# Patient Record
Sex: Female | Born: 1960 | Race: White | Hispanic: No | Marital: Married | Smoking: Never smoker
Health system: Southern US, Community
[De-identification: ages and names within clinical notes are randomized; demographics above are authoritative.]

## PROBLEM LIST (undated history)

## (undated) DIAGNOSIS — R9431 Abnormal electrocardiogram [ECG] [EKG]: Secondary | ICD-10-CM

## (undated) DIAGNOSIS — IMO0001 Reserved for inherently not codable concepts without codable children: Secondary | ICD-10-CM

## (undated) DIAGNOSIS — G43909 Migraine, unspecified, not intractable, without status migrainosus: Secondary | ICD-10-CM

## (undated) DIAGNOSIS — D649 Anemia, unspecified: Secondary | ICD-10-CM

## (undated) DIAGNOSIS — I10 Essential (primary) hypertension: Secondary | ICD-10-CM

## (undated) DIAGNOSIS — M4802 Spinal stenosis, cervical region: Secondary | ICD-10-CM

## (undated) DIAGNOSIS — K648 Other hemorrhoids: Secondary | ICD-10-CM

## (undated) HISTORY — DX: Reserved for inherently not codable concepts without codable children: IMO0001

## (undated) HISTORY — PX: ORIF CLAVICULAR FRACTURE: SHX5055

## (undated) HISTORY — PX: FOOT SURGERY: SHX648

## (undated) HISTORY — DX: Anemia, unspecified: D64.9

## (undated) HISTORY — DX: Other hemorrhoids: K64.8

## (undated) HISTORY — PX: TONSILLECTOMY AND ADENOIDECTOMY: SUR1326

## (undated) HISTORY — PX: OTHER SURGICAL HISTORY: SHX169

## (undated) HISTORY — DX: Migraine, unspecified, not intractable, without status migrainosus: G43.909

## (undated) HISTORY — DX: Abnormal electrocardiogram (ECG) (EKG): R94.31

## (undated) HISTORY — DX: Spinal stenosis, cervical region: M48.02

## (undated) HISTORY — DX: Essential (primary) hypertension: I10

---

## 1987-02-14 HISTORY — PX: OTHER SURGICAL HISTORY: SHX169

## 1997-08-21 ENCOUNTER — Emergency Department (HOSPITAL_COMMUNITY): Admission: EM | Admit: 1997-08-21 | Discharge: 1997-08-21 | Payer: Self-pay | Admitting: Emergency Medicine

## 1999-01-05 ENCOUNTER — Other Ambulatory Visit: Admission: RE | Admit: 1999-01-05 | Discharge: 1999-01-05 | Payer: Self-pay | Admitting: Obstetrics and Gynecology

## 1999-05-11 ENCOUNTER — Encounter: Admission: RE | Admit: 1999-05-11 | Discharge: 1999-05-11 | Payer: Self-pay | Admitting: Obstetrics and Gynecology

## 1999-05-11 ENCOUNTER — Encounter: Payer: Self-pay | Admitting: Obstetrics and Gynecology

## 2000-07-05 ENCOUNTER — Encounter: Payer: Self-pay | Admitting: Obstetrics and Gynecology

## 2000-07-05 ENCOUNTER — Encounter: Admission: RE | Admit: 2000-07-05 | Discharge: 2000-07-05 | Payer: Self-pay | Admitting: Obstetrics and Gynecology

## 2000-08-01 ENCOUNTER — Other Ambulatory Visit: Admission: RE | Admit: 2000-08-01 | Discharge: 2000-08-01 | Payer: Self-pay | Admitting: Obstetrics and Gynecology

## 2000-08-02 ENCOUNTER — Encounter (INDEPENDENT_AMBULATORY_CARE_PROVIDER_SITE_OTHER): Payer: Self-pay

## 2000-08-02 ENCOUNTER — Other Ambulatory Visit: Admission: RE | Admit: 2000-08-02 | Discharge: 2000-08-02 | Payer: Self-pay | Admitting: Obstetrics and Gynecology

## 2001-02-13 HISTORY — PX: LUMBAR LAMINECTOMY: SHX95

## 2001-07-25 ENCOUNTER — Encounter: Admission: RE | Admit: 2001-07-25 | Discharge: 2001-07-25 | Payer: Self-pay | Admitting: Obstetrics and Gynecology

## 2001-07-25 ENCOUNTER — Encounter: Payer: Self-pay | Admitting: Obstetrics and Gynecology

## 2001-09-26 ENCOUNTER — Encounter: Admission: RE | Admit: 2001-09-26 | Discharge: 2001-09-26 | Payer: Self-pay | Admitting: Internal Medicine

## 2001-09-26 ENCOUNTER — Encounter: Payer: Self-pay | Admitting: Internal Medicine

## 2001-11-05 ENCOUNTER — Encounter: Payer: Self-pay | Admitting: Internal Medicine

## 2001-11-05 ENCOUNTER — Encounter: Admission: RE | Admit: 2001-11-05 | Discharge: 2001-11-05 | Payer: Self-pay | Admitting: Internal Medicine

## 2001-11-12 ENCOUNTER — Ambulatory Visit (HOSPITAL_COMMUNITY): Admission: RE | Admit: 2001-11-12 | Discharge: 2001-11-12 | Payer: Self-pay | Admitting: Internal Medicine

## 2001-11-12 ENCOUNTER — Encounter: Payer: Self-pay | Admitting: Internal Medicine

## 2002-02-12 ENCOUNTER — Other Ambulatory Visit: Admission: RE | Admit: 2002-02-12 | Discharge: 2002-02-12 | Payer: Self-pay | Admitting: Obstetrics and Gynecology

## 2002-02-12 ENCOUNTER — Encounter: Admission: RE | Admit: 2002-02-12 | Discharge: 2002-02-12 | Payer: Self-pay | Admitting: Internal Medicine

## 2002-02-12 ENCOUNTER — Encounter: Payer: Self-pay | Admitting: Internal Medicine

## 2002-07-28 ENCOUNTER — Encounter: Payer: Self-pay | Admitting: Obstetrics and Gynecology

## 2002-07-28 ENCOUNTER — Encounter: Admission: RE | Admit: 2002-07-28 | Discharge: 2002-07-28 | Payer: Self-pay | Admitting: Obstetrics and Gynecology

## 2003-08-20 ENCOUNTER — Encounter: Admission: RE | Admit: 2003-08-20 | Discharge: 2003-08-20 | Payer: Self-pay | Admitting: Obstetrics and Gynecology

## 2003-08-25 ENCOUNTER — Encounter: Admission: RE | Admit: 2003-08-25 | Discharge: 2003-08-25 | Payer: Self-pay | Admitting: Obstetrics and Gynecology

## 2004-03-25 ENCOUNTER — Ambulatory Visit: Payer: Self-pay | Admitting: Family Medicine

## 2004-04-28 ENCOUNTER — Other Ambulatory Visit: Admission: RE | Admit: 2004-04-28 | Discharge: 2004-04-28 | Payer: Self-pay | Admitting: Obstetrics and Gynecology

## 2004-05-02 ENCOUNTER — Encounter: Admission: RE | Admit: 2004-05-02 | Discharge: 2004-05-02 | Payer: Self-pay | Admitting: Obstetrics and Gynecology

## 2004-09-01 ENCOUNTER — Encounter: Admission: RE | Admit: 2004-09-01 | Discharge: 2004-09-01 | Payer: Self-pay | Admitting: Obstetrics and Gynecology

## 2004-09-14 ENCOUNTER — Encounter: Admission: RE | Admit: 2004-09-14 | Discharge: 2004-09-14 | Payer: Self-pay | Admitting: Obstetrics and Gynecology

## 2004-09-16 ENCOUNTER — Encounter: Admission: RE | Admit: 2004-09-16 | Discharge: 2004-09-16 | Payer: Self-pay | Admitting: Obstetrics and Gynecology

## 2005-02-13 DIAGNOSIS — R9431 Abnormal electrocardiogram [ECG] [EKG]: Secondary | ICD-10-CM

## 2005-02-13 DIAGNOSIS — IMO0001 Reserved for inherently not codable concepts without codable children: Secondary | ICD-10-CM

## 2005-02-13 HISTORY — DX: Reserved for inherently not codable concepts without codable children: IMO0001

## 2005-02-13 HISTORY — DX: Abnormal electrocardiogram (ECG) (EKG): R94.31

## 2005-03-24 ENCOUNTER — Ambulatory Visit: Payer: Self-pay | Admitting: Internal Medicine

## 2005-03-28 ENCOUNTER — Ambulatory Visit: Payer: Self-pay | Admitting: Internal Medicine

## 2005-04-04 ENCOUNTER — Ambulatory Visit: Payer: Self-pay | Admitting: Internal Medicine

## 2005-04-20 ENCOUNTER — Ambulatory Visit: Payer: Self-pay

## 2005-04-26 ENCOUNTER — Ambulatory Visit: Payer: Self-pay | Admitting: Internal Medicine

## 2005-05-25 ENCOUNTER — Other Ambulatory Visit: Admission: RE | Admit: 2005-05-25 | Discharge: 2005-05-25 | Payer: Self-pay | Admitting: Obstetrics and Gynecology

## 2005-10-23 ENCOUNTER — Encounter: Admission: RE | Admit: 2005-10-23 | Discharge: 2005-10-23 | Payer: Self-pay | Admitting: Obstetrics and Gynecology

## 2006-06-14 DIAGNOSIS — M4800 Spinal stenosis, site unspecified: Secondary | ICD-10-CM

## 2006-06-14 DIAGNOSIS — Z9089 Acquired absence of other organs: Secondary | ICD-10-CM | POA: Insufficient documentation

## 2006-06-15 ENCOUNTER — Ambulatory Visit: Payer: Self-pay | Admitting: Internal Medicine

## 2006-06-15 LAB — CONVERTED CEMR LAB
Albumin: 3.8 g/dL (ref 3.5–5.2)
Basophils Absolute: 0.1 10*3/uL (ref 0.0–0.1)
Bilirubin, Direct: 0.1 mg/dL (ref 0.0–0.3)
Eosinophils Absolute: 0.1 10*3/uL (ref 0.0–0.6)
Eosinophils Relative: 1.5 % (ref 0.0–5.0)
GFR calc Af Amer: 87 mL/min
GFR calc non Af Amer: 72 mL/min
Glucose, Bld: 82 mg/dL (ref 70–99)
HDL: 55.4 mg/dL (ref >39.0)
Lymphocytes Relative: 25.5 % (ref 12.0–46.0)
MCHC: 34.2 g/dL (ref 30.0–36.0)
MCV: 87.2 fL (ref 78.0–100.0)
Monocytes Absolute: 0.4 10*3/uL (ref 0.2–0.7)
Neutro Abs: 6 10*3/uL (ref 1.4–7.7)
Neutrophils Relative %: 68.1 % (ref 43.0–77.0)
Potassium: 4.1 meq/L (ref 3.5–5.1)
RBC: 4.38 M/uL (ref 3.87–5.11)
Sodium: 144 meq/L (ref 135–145)
TSH: 1.68 microintl units/mL (ref 0.35–5.50)
Total CHOL/HDL Ratio: 2.9
Total Protein: 6.7 g/dL (ref 6.0–8.3)
Triglycerides: 88 mg/dL (ref 0–149)
VLDL: 18 mg/dL (ref 0–40)
WBC: 8.8 10*3/uL (ref 4.5–10.5)

## 2006-10-01 ENCOUNTER — Ambulatory Visit: Payer: Self-pay | Admitting: Internal Medicine

## 2006-11-15 ENCOUNTER — Encounter: Admission: RE | Admit: 2006-11-15 | Discharge: 2006-11-15 | Payer: Self-pay | Admitting: Obstetrics and Gynecology

## 2006-11-21 ENCOUNTER — Encounter: Admission: RE | Admit: 2006-11-21 | Discharge: 2006-11-21 | Payer: Self-pay | Admitting: Obstetrics and Gynecology

## 2007-09-03 ENCOUNTER — Ambulatory Visit: Payer: Self-pay | Admitting: Internal Medicine

## 2007-09-03 DIAGNOSIS — D649 Anemia, unspecified: Secondary | ICD-10-CM | POA: Insufficient documentation

## 2007-09-03 DIAGNOSIS — R9431 Abnormal electrocardiogram [ECG] [EKG]: Secondary | ICD-10-CM | POA: Insufficient documentation

## 2007-09-03 DIAGNOSIS — R635 Abnormal weight gain: Secondary | ICD-10-CM | POA: Insufficient documentation

## 2007-11-25 ENCOUNTER — Ambulatory Visit: Payer: Self-pay | Admitting: Family Medicine

## 2007-12-10 ENCOUNTER — Encounter: Admission: RE | Admit: 2007-12-10 | Discharge: 2007-12-10 | Payer: Self-pay | Admitting: Obstetrics and Gynecology

## 2008-03-19 ENCOUNTER — Ambulatory Visit: Payer: Self-pay | Admitting: Internal Medicine

## 2008-03-19 DIAGNOSIS — M79609 Pain in unspecified limb: Secondary | ICD-10-CM | POA: Insufficient documentation

## 2008-04-02 ENCOUNTER — Encounter: Payer: Self-pay | Admitting: Internal Medicine

## 2008-12-21 ENCOUNTER — Ambulatory Visit: Payer: Self-pay | Admitting: Internal Medicine

## 2008-12-21 DIAGNOSIS — G43909 Migraine, unspecified, not intractable, without status migrainosus: Secondary | ICD-10-CM | POA: Insufficient documentation

## 2008-12-21 DIAGNOSIS — R030 Elevated blood-pressure reading, without diagnosis of hypertension: Secondary | ICD-10-CM

## 2008-12-21 DIAGNOSIS — Z780 Asymptomatic menopausal state: Secondary | ICD-10-CM

## 2008-12-23 ENCOUNTER — Encounter: Admission: RE | Admit: 2008-12-23 | Discharge: 2008-12-23 | Payer: Self-pay | Admitting: Obstetrics and Gynecology

## 2008-12-28 ENCOUNTER — Telehealth: Payer: Self-pay | Admitting: Internal Medicine

## 2009-01-11 ENCOUNTER — Encounter: Admission: RE | Admit: 2009-01-11 | Discharge: 2009-01-11 | Payer: Self-pay | Admitting: Obstetrics and Gynecology

## 2009-01-14 ENCOUNTER — Encounter: Payer: Self-pay | Admitting: Internal Medicine

## 2009-03-09 ENCOUNTER — Encounter: Payer: Self-pay | Admitting: Internal Medicine

## 2009-05-04 ENCOUNTER — Ambulatory Visit: Payer: Self-pay | Admitting: Internal Medicine

## 2009-05-18 ENCOUNTER — Telehealth (INDEPENDENT_AMBULATORY_CARE_PROVIDER_SITE_OTHER): Payer: Self-pay | Admitting: *Deleted

## 2009-05-20 ENCOUNTER — Ambulatory Visit: Payer: Self-pay | Admitting: Internal Medicine

## 2009-05-24 LAB — CONVERTED CEMR LAB
HCT: 35.3 % — ABNORMAL LOW (ref 36.0–46.0)
Hemoglobin: 11.8 g/dL — ABNORMAL LOW (ref 12.0–15.0)
MCHC: 33.4 g/dL (ref 30.0–36.0)
MCV: 83.9 fL (ref 78.0–100.0)
Platelets: 249 10*3/uL (ref 150.0–400.0)
RDW: 15.8 % — ABNORMAL HIGH (ref 11.5–14.6)

## 2009-05-25 ENCOUNTER — Ambulatory Visit: Payer: Self-pay | Admitting: Internal Medicine

## 2009-05-25 DIAGNOSIS — G479 Sleep disorder, unspecified: Secondary | ICD-10-CM

## 2009-05-25 DIAGNOSIS — R5383 Other fatigue: Secondary | ICD-10-CM

## 2009-05-25 DIAGNOSIS — R5381 Other malaise: Secondary | ICD-10-CM

## 2009-07-01 ENCOUNTER — Ambulatory Visit: Payer: Self-pay | Admitting: Internal Medicine

## 2009-07-05 LAB — CONVERTED CEMR LAB
ALT: 20 units/L (ref 0–35)
BUN: 10 mg/dL (ref 6–23)
Basophils Absolute: 0 10*3/uL (ref 0.0–0.1)
CO2: 30 meq/L (ref 19–32)
Chloride: 102 meq/L (ref 96–112)
Creatinine, Ser: 0.9 mg/dL (ref 0.4–1.2)
Eosinophils Relative: 2.2 % (ref 0.0–5.0)
Folate: 11.8 ng/mL
Free T4: 0.8 ng/dL (ref 0.6–1.6)
HCT: 39.7 % (ref 36.0–46.0)
Hemoglobin: 13.4 g/dL (ref 12.0–15.0)
Iron: 44 ug/dL (ref 42–145)
Lymphocytes Relative: 36.6 % (ref 12.0–46.0)
Lymphs Abs: 2.3 10*3/uL (ref 0.7–4.0)
Monocytes Relative: 5.5 % (ref 3.0–12.0)
Neutro Abs: 3.4 10*3/uL (ref 1.4–7.7)
Platelets: 258 10*3/uL (ref 150.0–400.0)
RDW: 15.9 % — ABNORMAL HIGH (ref 11.5–14.6)
TSH: 2.71 microintl units/mL (ref 0.35–5.50)
Total Bilirubin: 0.4 mg/dL (ref 0.3–1.2)
Total Protein: 6.9 g/dL (ref 6.0–8.3)
Vitamin B-12: 322 pg/mL (ref 211–911)
WBC: 6.2 10*3/uL (ref 4.5–10.5)

## 2009-08-05 ENCOUNTER — Telehealth (INDEPENDENT_AMBULATORY_CARE_PROVIDER_SITE_OTHER): Payer: Self-pay | Admitting: *Deleted

## 2009-09-14 ENCOUNTER — Telehealth (INDEPENDENT_AMBULATORY_CARE_PROVIDER_SITE_OTHER): Payer: Self-pay | Admitting: *Deleted

## 2009-09-15 ENCOUNTER — Ambulatory Visit: Payer: Self-pay | Admitting: Internal Medicine

## 2009-09-16 ENCOUNTER — Ambulatory Visit: Payer: Self-pay | Admitting: Cardiovascular Disease

## 2009-09-20 ENCOUNTER — Telehealth (INDEPENDENT_AMBULATORY_CARE_PROVIDER_SITE_OTHER): Payer: Self-pay | Admitting: *Deleted

## 2010-01-10 ENCOUNTER — Telehealth: Payer: Self-pay | Admitting: Internal Medicine

## 2010-01-26 ENCOUNTER — Encounter
Admission: RE | Admit: 2010-01-26 | Discharge: 2010-01-26 | Payer: Self-pay | Source: Home / Self Care | Attending: Obstetrics and Gynecology | Admitting: Obstetrics and Gynecology

## 2010-02-03 ENCOUNTER — Ambulatory Visit: Payer: Self-pay | Admitting: Internal Medicine

## 2010-02-03 DIAGNOSIS — R61 Generalized hyperhidrosis: Secondary | ICD-10-CM

## 2010-02-03 DIAGNOSIS — N951 Menopausal and female climacteric states: Secondary | ICD-10-CM

## 2010-02-13 HISTORY — PX: COLONOSCOPY: SHX174

## 2010-02-15 ENCOUNTER — Telehealth: Payer: Self-pay | Admitting: Internal Medicine

## 2010-03-06 ENCOUNTER — Encounter: Payer: Self-pay | Admitting: Obstetrics and Gynecology

## 2010-03-09 ENCOUNTER — Telehealth: Payer: Self-pay | Admitting: Internal Medicine

## 2010-03-13 LAB — CONVERTED CEMR LAB
ALT: 12 units/L (ref 0–35)
ALT: 19 units/L (ref 0–35)
AST: 16 units/L (ref 0–37)
Albumin: 3.7 g/dL (ref 3.5–5.2)
Alkaline Phosphatase: 50 units/L (ref 39–117)
BUN: 9 mg/dL (ref 6–23)
Basophils Relative: 0.9 % (ref 0.0–3.0)
Bilirubin, Direct: 0 mg/dL (ref 0.0–0.3)
CO2: 30 meq/L (ref 19–32)
Calcium: 9 mg/dL (ref 8.4–10.5)
Calcium: 9.1 mg/dL (ref 8.4–10.5)
Creatinine, Ser: 1 mg/dL (ref 0.4–1.2)
Eosinophils Absolute: 0.2 10*3/uL (ref 0.0–0.7)
Eosinophils Absolute: 0.2 10*3/uL (ref 0.0–0.7)
FSH: 33.4 milliintl units/mL
GFR calc non Af Amer: 80.89 mL/min (ref >60)
HDL: 63 mg/dL (ref >39.00)
Hemoglobin: 13.3 g/dL (ref 12.0–15.0)
Iron: 37 ug/dL — ABNORMAL LOW (ref 42–145)
LDL Cholesterol: 96 mg/dL (ref 0–99)
Lymphocytes Relative: 34.4 % (ref 12.0–46.0)
Lymphocytes Relative: 36.6 % (ref 12.0–46.0)
MCHC: 34.3 g/dL (ref 30.0–36.0)
Monocytes Absolute: 0.4 10*3/uL (ref 0.1–1.0)
Monocytes Relative: 5.9 % (ref 3.0–12.0)
Monocytes Relative: 6.7 % (ref 3.0–12.0)
Neutro Abs: 3.6 10*3/uL (ref 1.4–7.7)
Neutrophils Relative %: 54.3 % (ref 43.0–77.0)
Potassium: 3.7 meq/L (ref 3.5–5.1)
RBC: 4.39 M/uL (ref 3.87–5.11)
RBC: 4.61 M/uL (ref 3.87–5.11)
RDW: 13.8 % (ref 11.5–14.6)
Sodium: 141 meq/L (ref 135–145)
Total Bilirubin: 0.5 mg/dL (ref 0.3–1.2)
Total Bilirubin: 0.6 mg/dL (ref 0.3–1.2)
Total CHOL/HDL Ratio: 3
Total CHOL/HDL Ratio: 3.6
Transferrin: 277 mg/dL (ref >=212.0)
Triglycerides: 93 mg/dL (ref 0–149)
VLDL: 24 mg/dL (ref 0.0–40.0)
WBC: 7.2 10*3/uL (ref 4.5–10.5)

## 2010-03-17 NOTE — Consult Note (Signed)
Summary: [facility]  [facility]   Imported By: Lanelle Bal 03/29/2009 13:42:06  _____________________________________________________________________  External Attachment:    Type:   Image     Comment:   External Document

## 2010-03-17 NOTE — Progress Notes (Signed)
Summary: Meds and black cohosh  Phone Note Call from Patient Call back at Work Phone [phone]   Summary of Call: Patient called today and would like to know if she can take black cohosh along with her other meds (clonidine). Please advise. Initial call taken by: Lucious Groves CMA,  February 15, 2010 11:30 AM  Follow-up for Phone Call        I would take the Cloniodine alone for 2 weeks to help assess response, then  add Black Cohosh if needed Follow-up by: Marga Melnick MD,  February 15, 2010 11:54 AM  Additional Follow-up for Phone Call Additional follow up Details #1::        Patient needed. Additional Follow-up by: Lucious Groves CMA,  February 15, 2010 3:47 PM

## 2010-03-17 NOTE — Progress Notes (Signed)
Summary: REFILL REQUEST  Phone Note Refill Request Call back at [phone] Message from:  Pharmacy on August 05, 2009 8:14 AM  Refills Requested: Medication #1:  CYMBALTA 60 MG CPEP 1 once daily.   Dosage confirmed as above?Dosage Confirmed   Supply Requested: 3 months   Last Refilled: [facility] [address] DAY SUPPLY REQUESTED  Next Appointment Scheduled: NONE Initial call taken by: Lavell Islam,  August 05, 2009 8:15 AM    Prescriptions: CYMBALTA 60 MG CPEP (DULOXETINE HCL) 1 once daily  #90 x 1   Entered by:   Shonna Chock   Authorized by:   Marga Melnick MD   Signed by:   Shonna Chock on 08/05/2009   Method used:   Electronically to        [facility]  [phone][facility] (retail)       [address] [place], [place]  [address]       Ph: [phone] or [phone]       Fax: [phone]   RxID:   [id number]

## 2010-03-17 NOTE — Assessment & Plan Note (Signed)
Summary: DISCUSS BP MED AND DIARY OF READINGS/KB   Vital Signs:  Patient profile:   50 year old female Weight:      172.8 pounds BMI:     27.99 Temp:     97.6 degrees F oral Pulse rate:   64 / minute Resp:     14 per minute BP sitting:   126 / 82  (left arm) Cuff size:   large  Vitals Entered By: Shonna Chock CMA (February 03, 2010 8:09 AM) CC: 1.) B/P concerns   2.) Sweating-? related to Cymbalta, patient would like to discuss d/c'ing Cymbalta   CC:  1.) B/P concerns   2.) Sweating-? related to Cymbalta and patient would like to discuss d/c'ing Cymbalta.  History of Present Illness:      This is a 50 year old woman who presents for Hypertension follow-up; she questions BP control..  The patient reports  minor headaches relieved with Tylenol or NSAIDS , but denies lightheadedness, urinary frequency, edema, and fatigue.  The patient denies the following associated symptoms: chest pain, chest pressure, exercise intolerance, dyspnea, palpitations, and syncope.  Compliance with medications (by patient report) has been near 100%.  The patient reports exercising 2X per week.  Adjunctive measures currently used by the patient include salt restriction. BP range :118/63- 149/84.  Sweating is excessive, especially after showering in am.Hot flashes  hourly during the day.No menses X 4 months.FSH was normal last Summer.  Current Medications (verified): 1)  Cymbalta 60 Mg Cpep (Duloxetine Hcl) .[name].. 1 Once Daily 2)  Metoprolol Tartrate 25 Mg Tabs (Metoprolol Tartrate) .[name].. 1 Po Two Times A Day If Bp Averages > 135/85  Allergies: 1)  ! Vioxx  Physical Exam  General:  well-nourished;alert,appropriate and cooperative throughout examination Lungs:  Normal respiratory effort, chest expands symmetrically. Lungs are clear to auscultation, no crackles or wheezes. Heart:  regular rhythm, no murmur, no gallop, no rub, no JVD, and bradycardia.   Pulses:  R and L carotid,radial,dorsalis pedis and posterior  tibial pulses are full and equal bilaterally Extremities:  No clubbing, cyanosis, edema. Skin:  Damp    Impression & Recommendations:  Problem # 1:  ELEVATED BLOOD PRESSURE WITHOUT DIAGNOSIS OF HYPERTENSION (ICD-796.2)  The following medications were removed from the medication list:    Metoprolol Tartrate 25 Mg Tabs (Metoprolol tartrate) .[name]... 1 po two times a day if bp averages > 135/85 Her updated medication list for this problem includes:    Clonidine Hcl 0.1 Mg Tabs (Clonidine hcl) .[name]... 1 two times a day  Problem # 2:  SWEATING (ICD-780.8)  Problem # 3:  HOT FLASHES (ICD-627.2)  Complete Medication List: 1)  Cymbalta 60 Mg Cpep (Duloxetine hcl) .[name].. 1 once daily 2)  Clonidine Hcl 0.1 Mg Tabs (Clonidine hcl) .[name].. 1 two times a day Prescriptions: CLONIDINE HCL 0.1 MG TABS (CLONIDINE HCL) 1 two times a day  #60 x 5   Entered and Authorized by:   Marga Melnick MD   Signed by:   Marga Melnick MD on 02/03/2010   Method used:   Print then Give to Patient   RxID:   [id number]    Orders Added: 1)  Est. Patient Level III [08657]  Appended Document: DISCUSS BP MED AND DIARY OF READINGS/KB She wants to stop Cymbalta. Psych ROS completed & is negative.Her parents are doing well..It will be weaned & D/Ced.

## 2010-03-17 NOTE — Assessment & Plan Note (Signed)
Summary: DISCUSS MIGRAINES//KN   Vital Signs:  Patient profile:   50 year old female Weight:      165.8 pounds Temp:     98.2 degrees F oral Pulse rate:   72 / minute Resp:     12 per minute BP sitting:   124 / 76  (left arm) Cuff size:   large  Vitals Entered By: Shonna Chock CMA (September 15, 2009 10:47 AM) CC: Migraines: Patient was on the phone Monday and knew what she wanted to say, words were coming out but not the words she wanted to say. Patient states that when her Migraines onset they last for 20-30min and it seems like she is under water, Headaches   CC:  Migraines: Patient was on the phone Monday and knew what she wanted to say, words were coming out but not the words she wanted to say. Patient states that when her Migraines onset they last for 20-43min and it seems like she is under water, and Headaches.  History of Present Illness:  Headaches with new Neurologic signs:      This is a 50 year old woman who presents with  increase in "Silent Migraines " over past week in context of stress of family illness. The patient denies nausea, vomiting, sweats, tearing of eyes, nasal congestion, sinus pain, sinus pressure, photophobia, and phonophobia.  The headache is described as intermittent and dull & occurring after a Prodrome of  visual symptoms.  The location of the pain is over crown  bilaterally.  She had 30 minutes of loss of peripheral vision & wavy vision  "as if  under water" followed by the  headche as described above. After 20 minutes of vision changes she had difficulty with word retrieval, a new sign, not previously experienced. The patient denies the following high-risk features: fever, neck pain/stiffness, focal weakness, and altered mental status.  Treatment has included acetaminophen with headache relief.  Until 3 years ago her migraines were manifested by more sever  pain  @ anterior  crown with N&V. She is peri menopausal; no HRT to date.  Current Medications  (verified): 1)  Cymbalta 60 Mg Cpep (Duloxetine Hcl) .[name].. 1 Once Daily  Allergies: 1)  ! Vioxx  Review of Systems ENT:  Complains of decreased hearing; denies earache; "Leaf blower " sound in head constantly since Feb. MS:  Denies muscle aches; Body aches better with Cymbalta. Neuro:  Denies brief paralysis, difficulty with concentration, disturbances in coordination, numbness, poor balance, tingling, and weakness. Psych:  Denies anxiety, depression, easily angered, easily tearful, and irritability.  Physical Exam  General:  in no acute distress; alert,appropriate and cooperative throughout examination Eyes:  No corneal or conjunctival inflammation noted. EOMI. Perrla. Field of Vision grossly normal. Ears:  External ear exam shows no significant lesions or deformities.  Otoscopic examination reveals clear canals, tympanic membranes are intact bilaterally without bulging, retraction, inflammation or discharge. Hearing is grossly normal bilaterally to whisper @ 6 ft. Bone conduction > air conduction L ear. Nose:  External nasal examination shows no deformity or inflammation. Nasal mucosa are pink and moist without lesions or exudates. Mouth:  Oral mucosa and oropharynx without lesions or exudates.  Teeth in good repair.No tongue deviation Heart:  Normal rate and regular rhythm. S1 and S2 normal without gallop, murmur, click, rub . Pulses:  R and L carotid pulses are full and equal bilaterally Extremities:  No clubbing, cyanosis, edema, or deformity noted  Neurologic:  alert & oriented X3,  cranial nerves II-XII intact except tuning fork, strength normal in all extremities, sensation intact to light touch, gait normal, DTRs symmetrical and normal, finger-to-nose normal, heel-to-shin normal, and Romberg negative.   Skin:  Intact without suspicious lesions or rashes Cervical Nodes:  No lymphadenopathy noted Axillary Nodes:  No palpable lymphadenopathy Psych:  memory intact for recent and remote,  normally interactive, good eye contact, and not anxious appearing.     Impression & Recommendations:  Problem # 1:  MIGRAINE (ICD-346.90)  Now with visual Prodrome  & word retrieval deficit . Abnormal Tuning Fork exam on L  Orders: Radiology Referral (Radiology)  Complete Medication List: 1)  Cymbalta 60 Mg Cpep (Duloxetine hcl) .[name].. 1 once daily  Patient Instructions: 1)  Take Excedrin Migraine with any Prodrome as discussed.Keep a Headache Diary.Take an 81 mg coated  Aspirin every day.

## 2010-03-17 NOTE — Progress Notes (Signed)
Summary: lmom [id number]  Phone Note Call from Patient Call back at Work Phone [phone]   Summary of Call: Patient left message stating that she has silent migraines and had one yesterday. Maury Dus she lost her speech, she knew what she wanted to say but was not able to get it out. The migraine lasted about 30 min, and the loss of speeach lasted only a couple of mins.Patient notes that she takes Cymbalta and wants to be sure that is not the cause. Patient notes that she is fine today and everything is good. Please advise. Initial call taken by: Lucious Groves CMA,  September 14, 2009 9:21 AM  Follow-up for Phone Call        This usually a neurologic  association of migraine itself, but she should be checked. OV please Follow-up by: Marga Melnick MD,  September 14, 2009 1:05 PM  Additional Follow-up for Phone Call Additional follow up Details #1::        lmom  at work # to call & schedule appt.Okey Regal Spring  September 14, 2009 3:08 PM     Additional Follow-up for Phone Call Additional follow up Details #2::    PT CALLED BACK AND WILL BE COMING IN TOMORROW FOR AN OV WITH DR. HOPPER @ 10:30.Karoline Caldwell Negrete  September 14, 2009 3:16 PM

## 2010-03-17 NOTE — Progress Notes (Signed)
Summary: questions  Phone Note Call from Patient Call back at Work Phone [phone] Call back at ok to lm   Summary of Call: Patient called noting that she is in menopause. She notes that she is no longer taking Cymbalta but her GYN would like for her to take Zoloft. So, the pt would like advisement on the following: 1.) She would like to know if Zoloft has the same side effects and is it safe for her?  2.) Also, She thinks she is still having side effects from Cymbalta withdrawl, pt notes it as a "rush through her head". 3.) Last night her BP was 172/85 and she notes that she only had one Clonidine yesterday.   Please advise. Initial call taken by: Lucious Groves CMA,  March 09, 2010 9:36 AM  Follow-up for Phone Call        she needs to take Clonidine two times a day ; OV if BP is > 135/85 on average. Zoloft &Cymbalta are remotely similar but Zoloft has been safe even in elderly (she's not) patients Follow-up by: Marga Melnick MD,  March 09, 2010 1:03 PM  Additional Follow-up for Phone Call Additional follow up Details #1::        Patient notified.  Additional Follow-up by: Lucious Groves CMA,  March 09, 2010 2:23 PM

## 2010-03-17 NOTE — Progress Notes (Signed)
Summary: CT Results  Phone Note Call from Patient Call back at Home Phone [phone]   Caller: Patient Call For: Marga Melnick MD Reason for Call: Lab or Test Results Summary of Call: Please call and give CT results Initial call taken by: Barnie Mort,  September 20, 2009 8:32 AM  Follow-up for Phone Call        Left message on machine for patient to return call when avaliable, Reason for call:  CT Results   Super , no findings of concern  present. Please keep Headache Diary to document any triggers for these  silent migraines. Hopp  Follow-up by: Shonna Chock CMA,  September 20, 2009 10:20 AM  Additional Follow-up for Phone Call Additional follow up Details #1::        Left message on machine for patient to return call when avaliable, Reason for call:   CT Results Additional Follow-up by: Shonna Chock CMA,  September 20, 2009 2:11 PM    Additional Follow-up for Phone Call Additional follow up Details #2::    Spoke with patient, patient ok'd information. Follow-up by: Shonna Chock CMA,  September 20, 2009 3:26 PM

## 2010-03-17 NOTE — Progress Notes (Signed)
Summary: seen 05/04/09 still some symptoms  Phone Note Call from Patient Call back at Work Phone [phone]   Caller: Patient Summary of Call: Pt called was seen for sinus inf 05/04/09, given Augmentin which did help;  sinus infection is gone. --symptoms are ; not being able to sleep, chills, ringing in ears Initial call taken by: Kandice Hams,  May 18, 2009 12:11 PM  Follow-up for Phone Call        Monitor temp ; if having chills, earache  or fever CBC& dif & OV  appt. Continue fluticasone 1  spray two times a day  & use Zyrtec OTC at bedtime (this is sedating). Go to [facility] MD for Eustachian Tube Dysfunction information Follow-up by: Marga Melnick MD,  May 18, 2009 1:14 PM  Additional Follow-up for Phone Call Additional follow up Details #1::        pt informed of Dr Drue Novel recommendations, lab scheduled and ov scheduled .Kandice Hams  May 18, 2009 2:14 PM  Additional Follow-up by: Kandice Hams,  May 18, 2009 2:14 PM

## 2010-03-17 NOTE — Progress Notes (Signed)
Summary: BP med  Phone Note Call from Patient   Details for Reason: [facility] Summary of Call: Patient called noting that her BP has been up and she was given an prescription at her [facility] to take if it remained high. Patient notes that she lost the prescription and would like it sent to the pharmacy above. I do not see this rx anywhere. Please advise. Initial call taken by: Lucious Groves CMA,  January 10, 2010 8:50 AM  Follow-up for Phone Call        Metoprolol 25 mg two times a day if BP averages > 135/85 #60. F/U appt with BP diary after 3 weeks Follow-up by: Marga Melnick MD,  January 10, 2010 10:06 AM  Additional Follow-up for Phone Call Additional follow up Details #1::        Left message on machine to call back to office. Lucious Groves CMA  January 10, 2010 10:43 AM   Patient notified and appt made. Lucious Groves CMA  January 10, 2010 11:40 AM     New/Updated Medications: METOPROLOL TARTRATE 25 MG TABS (METOPROLOL TARTRATE) 1 po two times a day if BP averages > 135/85 Prescriptions: METOPROLOL TARTRATE 25 MG TABS (METOPROLOL TARTRATE) 1 po two times a day if BP averages > 135/85  #60 x 1   Entered by:   Lucious Groves CMA   Authorized by:   Marga Melnick MD   Signed by:   Lucious Groves CMA on 01/10/2010   Method used:   [facility] to        [facility]  [phone]* (retail)       [address] [place], [place]  [address]       Ph: [phone] or [phone]       Fax: [phone]   RxID:   [id number]

## 2010-03-17 NOTE — Assessment & Plan Note (Signed)
Summary: sinus infection//lch   Vital Signs:  Patient profile:   50 year old female Weight:      165 pounds O2 Sat:      99 % on Room air Temp:     99.6 degrees F oral Pulse rate:   78 / minute Resp:     15 per minute BP sitting:   140 / 82  (left arm)  Vitals Entered By: Jeremy Johann CMA (May 04, 2009 12:43 PM)  O2 Flow:  Room air CC: sinus infection Comments REVIEWED MED LIST, PATIENT AGREED DOSE AND INSTRUCTION CORRECT    CC:  sinus infection.  History of Present Illness: Since early Feb she has had chest congestion ; gradual, but slow resolution with OTC meds , Alka Seltzer Cold, Tylenol. Residual malaise. As of 05/02/2009 she developed ST which has persisted with frontal headache & leg aches.  Allergies: 1)  ! Vioxx  Review of Systems General:  Complains of chills and sweats; denies fever; In peri menopausal state; FSH was 93  in last 3-4 weeks. ENT:  Complains of nasal congestion and sinus pressure; denies ear discharge; Minor facial pain; some dental pain . No purulence.[name] Resp:  Denies cough, shortness of breath, sputum productive, and wheezing. Allergy:  Denies itching eyes and sneezing.  Physical Exam  General:  in no acute distress; appears tired but not uncomfortable-appearing.   Ears:  External ear exam shows no significant lesions or deformities.  Otoscopic examination reveals clear canals, tympanic membranes are intact bilaterally without bulging, retraction, inflammation or discharge. Hearing is grossly normal bilaterally. Nose:  External nasal examination shows no deformity or inflammation. Nasal mucosa are pink and moist without lesions or exudates. Mouth:  Oral mucosa and oropharynx without lesions or exudates.  Teeth in good repair. MARKED pharyngeal erythema.   Lungs:  Normal respiratory effort, chest expands symmetrically. Lungs are clear to auscultation, no crackles or wheezes. Heart:  Normal rate and regular rhythm. S1 and S2 normal without gallop,  murmur, click, rub .S4 Skin:  Intact without suspicious lesions or rashes Cervical Nodes:  NO  lymphadenopathy noted Axillary Nodes:  No palpable lymphadenopathy   Impression & Recommendations:  Problem # 1:  SINUSITIS- ACUTE-NOS (ICD-461.9)  Her updated medication list for this problem includes:    Amoxicillin-pot Clavulanate 875-125 Mg Tabs (Amoxicillin-pot clavulanate) .[name]... 1 q 12 hrs with a meal    Fluticasone Propionate 50 Mcg/act Susp (Fluticasone propionate) .[name]... 1 spray two times a day  Orders: Rapid Strep ([address])  Complete Medication List: 1)  Venlafaxine Hcl 37.5 Mg Tabs (Venlafaxine hcl) .... Tale 1 tab once daily 2)  Amoxicillin-pot Clavulanate 875-125 Mg Tabs (Amoxicillin-pot clavulanate) .[name].. 1 q 12 hrs with a meal 3)  Fluticasone Propionate 50 Mcg/act Susp (Fluticasone propionate) .[name].. 1 spray two times a day  Patient Instructions: 1)  Neti pot once daily until sinuses clear. 2)  Drink as much fluid as you can tolerate for the next few days. Use "crossover technique " for nasal spray. Prescriptions: FLUTICASONE PROPIONATE 50 MCG/ACT SUSP (FLUTICASONE PROPIONATE) 1 spray two times a day  #1 x 5   Entered and Authorized by:   Marga Melnick MD   Signed by:   Marga Melnick MD on 05/04/2009   Method used:   Faxed to ...       [facility] [phone]* (retail)       [address] [facility]       [place],  [place]  [address]       Ph: [phone]       Fax: [phone]   RxID:   [id number] AMOXICILLIN-POT CLAVULANATE 875-125 MG TABS (AMOXICILLIN-POT CLAVULANATE) 1 q 12 hrs with a meal  #20 x 0   Entered and Authorized by:   Marga Melnick MD   Signed by:   Marga Melnick MD on 05/04/2009   Method used:   Faxed to ...       [facility] [phone]* (retail)       [address]       [place], [place]  [address]       Ph: [phone]       Fax: [phone]   RxID:   [id number]

## 2010-03-17 NOTE — Assessment & Plan Note (Signed)
Summary: followup on lab/alr   Vital Signs:  Patient profile:   50 year old female Weight:      164.6 pounds Pulse rate:   80 / minute Resp:     16 per minute BP sitting:   130 / 84  (left arm) Cuff size:   large  Vitals Entered By: Shonna Chock (May 25, 2009 4:13 PM) CC: Follow-up visit: discuss labs (copy given), Insomnia, Fatigue Comments REVIEWED MED LIST, PATIENT AGREED DOSE AND INSTRUCTION CORRECT    CC:  Follow-up visit: discuss labs (copy given), Insomnia, and Fatigue.  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 She  presents with Insomnia since early 03/2009.  The patient reports frequent awakening, early awakening, and daytime somnolence, but denies difficulty falling asleep.  Associated symptoms include weight gain of 15 # over past year.  Risk factors for insomnia include caffeine use,40 oz / day.  Behaviors that may contribute to insomnia include watching TV in bed and OTC sleep aids.  Past treatments that have been effective include hypnotics, her husband's Ambien.  She has vivid dreams of being killed. She relates sleep issues to menopuase , not to parents' issues. Dr Meissinger gave her Effexor but this caused tinnitus.        She  also presents with Fatigue for same interval.  The patient reports persistent fatigue and primarily physical fatigue.  The patient also reports night sweats.  The patient denies fever, weight loss, exertional chest pain, dyspnea, cough, and hemoptysis.  The patient denies the following symptoms: leg swelling, orthopnea, PND, melena, adenopathy, severe snoring, and skin or hair  changes.  Depressive symptoms include anhedonia, altered appetite, and poor sleep.  The patient denies feeling depressed.  Anemia present 04/07.  Allergies: 1)  ! Vioxx  Physical Exam  General:  well-nourished,in no acute distress; alert,appropriate and cooperative throughout  examination Eyes:  No corneal or conjunctival inflammation noted. Perrla.No lid lag Neck:  No deformities, masses, or tenderness noted. Lungs:  Normal respiratory effort, chest expands symmetrically. Lungs are clear to auscultation, no crackles or wheezes. Heart:  Normal rate and regular rhythm. S1 and S2 normal without gallop, murmur, click, rub.S4 Abdomen:  Bowel sounds positive,abdomen soft and non-tender without masses, organomegaly or hernias noted. Pulses:  R and L carotid,radial,dorsalis pedis and posterior tibial pulses are full and equal bilaterally Extremities:  No clubbing, cyanosis, edema, or deformity noted with normal full range of motion of all joints.   Neurologic:  alert & oriented X3 and gait normal.  Fine  tremor  Cervical Nodes:  No lymphadenopathy noted Axillary Nodes:  No palpable lymphadenopathy Psych:  memory intact for recent and remote, normally interactive, and good eye contact.     Impression & Recommendations:  Problem # 1:  SLEEP DISORDER (ICD-780.50)  Problem # 2:  FATIGUE (ICD-780.79)  Problem # 3:  ANEMIA, MILD (ICD-285.9)  Complete Medication List: 1)  Fluticasone Propionate 50 Mcg/act Susp (Fluticasone propionate) .[name].. 1 spray two times a day 2)  Cymbalta 60 Mg Cpep (Duloxetine hcl) .[name].. 1 once daily  Patient Instructions: 1)  Schedule fasting labs: 2)  BMP ; 3)  Hepatic Panel ;vitamin D level;free T4; 4)  TSH ;iron panel; B12; folate level; 5)  CBC w/ Diff. Consider referral to Glean Salen MD, Sleep Specialist if no better. Prescriptions: CYMBALTA 60 MG CPEP (DULOXETINE HCL) 1 once daily  #30 x 5   Entered  and Authorized by:   Marga Melnick MD   Signed by:   Marga Melnick MD on 05/25/2009   Method used:   Print then Give to Patient   RxID:   [id number]

## 2010-03-21 ENCOUNTER — Other Ambulatory Visit: Payer: Self-pay | Admitting: Internal Medicine

## 2010-03-21 ENCOUNTER — Ambulatory Visit (INDEPENDENT_AMBULATORY_CARE_PROVIDER_SITE_OTHER): Payer: Self-pay | Admitting: Internal Medicine

## 2010-03-21 ENCOUNTER — Encounter: Payer: Self-pay | Admitting: Internal Medicine

## 2010-03-21 DIAGNOSIS — R6883 Chills (without fever): Secondary | ICD-10-CM

## 2010-03-21 DIAGNOSIS — R51 Headache: Secondary | ICD-10-CM | POA: Insufficient documentation

## 2010-03-21 DIAGNOSIS — R519 Headache, unspecified: Secondary | ICD-10-CM | POA: Insufficient documentation

## 2010-03-21 DIAGNOSIS — R42 Dizziness and giddiness: Secondary | ICD-10-CM

## 2010-03-21 DIAGNOSIS — R197 Diarrhea, unspecified: Secondary | ICD-10-CM

## 2010-03-21 LAB — CBC WITH DIFFERENTIAL/PLATELET
Basophils Absolute: 0 10*3/uL (ref 0.0–0.1)
Basophils Relative: 0.6 % (ref 0.0–3.0)
Eosinophils Relative: 1.8 % (ref 0.0–5.0)
HCT: 38.5 % (ref 36.0–46.0)
Hemoglobin: 12.7 g/dL (ref 12.0–15.0)
Lymphs Abs: 2.9 10*3/uL (ref 0.7–4.0)
Monocytes Relative: 6 % (ref 3.0–12.0)
Neutro Abs: 4.3 10*3/uL (ref 1.4–7.7)
RBC: 4.73 Mil/uL (ref 3.87–5.11)
WBC: 7.9 10*3/uL (ref 4.5–10.5)

## 2010-03-21 LAB — BASIC METABOLIC PANEL
Calcium: 9.1 mg/dL (ref 8.4–10.5)
Chloride: 102 mEq/L (ref 96–112)
Creatinine, Ser: 0.9 mg/dL (ref 0.4–1.2)
GFR: 69.36 mL/min (ref >60.00)
Potassium: 4.2 mEq/L (ref 3.5–5.1)
Sodium: 141 mEq/L (ref 135–145)

## 2010-03-21 LAB — TSH: TSH: 2.42 u[IU]/mL (ref 0.35–5.50)

## 2010-03-31 NOTE — Assessment & Plan Note (Signed)
Summary: DIZZY/RH......   Vital Signs:  Patient profile:   50 year old female Weight:      168.2 pounds BMI:     27.25 Temp:     98.2 degrees F oral Pulse (ortho):   87 / minute Resp:     14 per minute BP standing:   144 / 92  Vitals Entered By: Shonna Chock CMA (March 21, 2010 11:47 AM) CC: Dizziness since d/c'ing cymbalta ( patient was placed on Zoloft but stopped after 1 week), patient also with a loud noise in head. Patient was seen at [facility] last Wed-? infection or virus, patient was given an injection for HA and nausea, Headaches   Serial Vital Signs/Assessments:  Time      Position  BP       Pulse  Resp  Temp     By 11:48 AM  Lying LA  142/94   78                    Chrae Malloy CMA 11:48 AM  Sitting   142/98   81                    Chrae Malloy CMA 11:48 AM  Standing  144/92   87                    Chrae Malloy CMA   CC:  Dizziness since d/c'ing cymbalta ( patient was placed on Zoloft but stopped after 1 week), patient also with a loud noise in head. Patient was seen at [facility] last Wed-? infection or virus, patient was given an injection for HA and nausea, and Headaches.  History of Present Illness:    She D/Ced Zoloft last week because of  headaches, artharlgias , myalgias  & chills;it had been Rxed by  Dr Antony Blackbird for hot flashes. FSH was 97.5. He  also   Rxed antibiotics 01/27 for UTI.   She was seen  02/01 @ [facility] ; "virus" diagnosed. Shot given for headache & nausea. The patient denies  associated nausea, vomiting, sweats, tearing of eyes, nasal congestion, sinus pain, sinus pressure, photophobia, and phonophobia.  The headache is described as intermittent and dull.  The location of the pain is typicaly over the anterior crown & rarely @  occipital.  The patient denies the following high-risk features: fever, neck pain/stiffness, vision loss or change, focal weakness, rash,  or  new type of headache.  The headaches  have no triggers; she has occasional prodrome of  "tunnel vision".  She describes intermittent dizziness. Prior treatment has included acetaminophen with benefit. PMH of migraines .    Current Medications (verified): 1)  Clonidine Hcl 0.1 Mg Tabs (Clonidine Hcl) .[name].. 1 Two Times A Day  Allergies: 1)  ! Vioxx  Review of Systems General:  Complains of chills. Eyes:  Denies double vision and vision loss-both eyes. ENT:  No purulence. Resp:  Denies cough and sputum productive. GI:  Complains of diarrhea; watery BMs since 02/5. GU:  Denies discharge, dysuria, and hematuria. Neuro:  Denies brief paralysis, numbness, tingling, and weakness.  Physical Exam  General:  well-nourished,in no acute distress; alert,appropriate and cooperative throughout examination Eyes:  No corneal or conjunctival inflammation noted. EOMI. Perrla. Field of  Vision grossly normal. Ears:  External ear exam shows no significant lesions or deformities.  Otoscopic examination reveals clear canals, tympanic membranes are intact bilaterally without bulging, retraction, inflammation or discharge. Hearing is grossly  normal bilaterally. Nose:  External nasal examination shows no deformity or inflammation. Nasal mucosa are pink and moist without lesions or exudates. Mouth:  Oral mucosa and oropharynx without lesions or exudates.  Teeth in good repair. No tongue deviation Lungs:  Normal respiratory effort, chest expands symmetrically. Lungs are clear to auscultation, no crackles or wheezes. Heart:  Normal rate and regular rhythm. S1 and S2 normal without gallop, murmur, click, rub .S4 Abdomen:  Bowel sounds positive,abdomen soft and non-tender without masses, organomegaly or hernias noted. Pulses:  R and L carotid,radial pulses are full and equal bilaterally Extremities:  No clubbing, cyanosis, edema. Neurologic:  alert & oriented X3, cranial nerves II-XII intact, strength normal in all extremities, sensation intact to light touch, gait normal, DTRs symmetrical and normal,  finger-to-nose normal, and Romberg negative.  Fine tremor of hands Skin:  Intact without suspicious lesions or rashes Cervical Nodes:  No lymphadenopathy noted Axillary Nodes:  No palpable lymphadenopathy Psych:  Oriented X3 and slightly anxious.     Impression & Recommendations:  Problem # 1:  HEADACHE (ICD-784.0)  ? migraine variant  Orders: Venipuncture (32440)  Problem # 2:  CHILLS WITHOUT FEVER (ICD-780.64)  X 7-10 days; recent UTI  Orders: Venipuncture (10272) TLB-ALT (SGPT) (84460-ALT) TLB-AST (SGOT) (84450-SGOT) TLB-Sedimentation Rate (ESR) (85652-ESR)  Problem # 3:  DIARRHEA (ICD-787.91)  X 24 hrs   Orders: Venipuncture (53664) TLB-TSH (Thyroid Stimulating Hormone) (84443-TSH)  Problem # 4:  DIZZINESS (ICD-780.4)  Orders: Venipuncture (40347) TLB-CBC Platelet - w/Differential (85025-CBCD) TLB-BMP (Basic Metabolic Panel-BMET) (80048-METABOL)  Complete Medication List: 1)  Clonidine Hcl 0.1 Mg Tabs (Clonidine hcl) .[name].. 1 two times a day 2)  Diazepam 2 Mg Tabs (Diazepam) .[name].. 1 every 8 -12 hrs as needed  Patient Instructions: 1)  Keep Headache Diary; take Excedrin Migraine for the Prodrome of tunnel vision. Immodium AD as needed for frank diarrhea. Align once daily until bowels are normal. Stool cultures/ studies if diarrhea persists. Prescriptions: DIAZEPAM 2 MG TABS (DIAZEPAM) 1 every 8 -12 hrs as needed  #30 x 0   Entered and Authorized by:   Marga Melnick MD   Signed by:   Marga Melnick MD on 03/21/2010   Method used:   Print then Give to Patient   RxID:   [id number]    Orders Added: 1)  Est. Patient Level IV [51884] 2)  Venipuncture [16606] 3)  TLB-CBC Platelet - w/Differential [85025-CBCD] 4)  TLB-BMP (Basic Metabolic Panel-BMET) [80048-METABOL] 5)  TLB-ALT (SGPT) [84460-ALT] 6)  TLB-AST (SGOT) [84450-SGOT] 7)  TLB-TSH (Thyroid Stimulating Hormone) [84443-TSH] 8)  TLB-Sedimentation Rate (ESR) [30160-FUX]
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
## 2010-11-08 ENCOUNTER — Encounter: Payer: Self-pay | Admitting: Internal Medicine

## 2010-11-08 ENCOUNTER — Encounter: Payer: Self-pay | Admitting: *Deleted

## 2010-11-09 ENCOUNTER — Encounter: Payer: Self-pay | Admitting: Internal Medicine

## 2010-11-09 ENCOUNTER — Encounter: Payer: 59 | Admitting: Internal Medicine

## 2011-01-13 ENCOUNTER — Encounter: Payer: Self-pay | Admitting: Internal Medicine

## 2011-01-13 ENCOUNTER — Encounter: Payer: Self-pay | Admitting: Gastroenterology

## 2011-01-13 ENCOUNTER — Ambulatory Visit (INDEPENDENT_AMBULATORY_CARE_PROVIDER_SITE_OTHER): Payer: 59 | Admitting: Internal Medicine

## 2011-01-13 VITALS — BP 128/80 | HR 58 | Temp 97.6°F | Resp 14 | Ht 65.5 in | Wt 178.2 lb

## 2011-01-13 DIAGNOSIS — Z0000 Encounter for general adult medical examination without abnormal findings: Secondary | ICD-10-CM

## 2011-01-13 DIAGNOSIS — R1012 Left upper quadrant pain: Secondary | ICD-10-CM

## 2011-01-13 DIAGNOSIS — N939 Abnormal uterine and vaginal bleeding, unspecified: Secondary | ICD-10-CM

## 2011-01-13 DIAGNOSIS — M4800 Spinal stenosis, site unspecified: Secondary | ICD-10-CM

## 2011-01-13 DIAGNOSIS — N926 Irregular menstruation, unspecified: Secondary | ICD-10-CM

## 2011-01-13 DIAGNOSIS — R9431 Abnormal electrocardiogram [ECG] [EKG]: Secondary | ICD-10-CM

## 2011-01-13 DIAGNOSIS — N951 Menopausal and female climacteric states: Secondary | ICD-10-CM

## 2011-01-13 NOTE — Patient Instructions (Signed)
Preventive Health Care: Exercise  30-45  minutes a day, 3-4 days a week. Walking is especially valuable in preventing Osteoporosis. Eat a low-fat diet with lots of fruits and vegetables, up to 7-9 servings per day. Consume less than 30 grams of sugar per day from foods & drinks with High Fructose Corn Syrup as # 1,2,3 or #4 on label. Health Care Power of Attorney & Living Will place you in charge of your health care  decisions. Verify these are  in place. Please complete stool cards

## 2011-01-13 NOTE — Progress Notes (Signed)
Subjective:    Patient ID: Whitney Blake, female    DOB: 08-Jan-1961, 50 y.o.   MRN: [id number]  HPI  Whitney Blake  is here for a physical;acute issues include menstrual irregularities (see notes in Problem List) & recent abdominal  pain.     Review of Systems  ABDOMINAL PAIN: Location: LUQ  Onset: late October   Radiation: no  Severity: up to 3 Quality: sharp  Duration: constant X 2 weeks ; resolved 2 weeks ago  Better with: direct pressure  Worse with: no factors Symptoms Nausea/Vomiting: no  Diarrhea: no but loose stool Constipation: no  Melena/BRBPR: yes, light red  Hematemesis: no  Anorexia: no  Fever/Chills: no  Dysuria/ hematuria/pyuria: no  Wt loss: no  NSAIDs/ASA: minimally LMP: regular X 3 months Past Surgeries: no colonoscopy; SOC reviewed. Her mother has had partial bowel resection for ischemic colitis.        Objective:   Physical Exam Gen.: Healthy and well-nourished in appearance. Alert, appropriate and cooperative throughout exam. Head: Normocephalic without obvious abnormalities  Eyes: No corneal or conjunctival inflammation noted. Pupils equal round reactive to light and accommodation. Fundal exam is benign without hemorrhages, exudate, papilledema. Extraocular motion intact. Vision grossly normal. Ears: External  ear exam reveals no significant lesions or deformities. Canals clear .TMs normal. Hearing is grossly normal bilaterally. Nose: External nasal exam reveals no deformity or inflammation. Nasal mucosa are pink and moist. No lesions or exudates noted.  Mouth: Oral mucosa and oropharynx reveal no lesions or exudates. Teeth in good repair. Neck: No deformities, masses, or tenderness noted. Range of motion &. Thyroid normal. Lungs: Normal respiratory effort; chest expands symmetrically. Lungs are clear to auscultation without rales, wheezes, or increased work of breathing. Heart: Normal rate and rhythm. Normal S1 and S2. No gallop, click, or rub. S 4 with  slight slurring; no murmur. Abdomen: Bowel sounds normal; abdomen soft and nontender. No masses, organomegaly or hernias noted. Genitalia: Dr Jackelyn Knife   .                                                                                   Musculoskeletal/extremities: No deformity or scoliosis noted of  the thoracic or lumbar spine but slight asymmetry of thoracic spine. No clubbing, cyanosis, edema, or deformity noted. Range of motion  normal .Tone & strength  normal.Joints normal. Nail health  good. Vascular: Carotid, radial artery, dorsalis pedis and  posterior tibial pulses are full and equal. No bruits present. Neurologic: Alert and oriented x3. Deep tendon reflexes symmetrical and normal.          Skin: Intact without suspicious lesions or rashes. Lymph: No cervical, axillary lymphadenopathy present. Psych: Mood and affect are normal. Normally interactive  Assessment & Plan:  #1 comprehensive physical exam; no acute findings #2 see Problem List with Assessments & Recommendations Plan: see Orders

## 2011-01-16 LAB — LIPID PANEL
Cholesterol: 183 mg/dL (ref 0–200)
VLDL: 26 mg/dL (ref 0.0–40.0)

## 2011-01-16 LAB — CBC WITH DIFFERENTIAL/PLATELET
Basophils Absolute: 0 10*3/uL (ref 0.0–0.1)
Eosinophils Absolute: 0.3 10*3/uL (ref 0.0–0.7)
HCT: 40.9 % (ref 36.0–46.0)
Lymphs Abs: 2.8 10*3/uL (ref 0.7–4.0)
MCHC: 32.9 g/dL (ref 30.0–36.0)
Monocytes Relative: 6.1 % (ref 3.0–12.0)
Platelets: 233 10*3/uL (ref 150.0–400.0)
RDW: 16.3 % — ABNORMAL HIGH (ref 11.5–14.6)

## 2011-01-16 LAB — HEPATIC FUNCTION PANEL
ALT: 21 U/L (ref 0–35)
AST: 21 U/L (ref 0–37)
Alkaline Phosphatase: 62 U/L (ref 39–117)
Bilirubin, Direct: 0.1 mg/dL (ref 0.0–0.3)
Total Bilirubin: 0.5 mg/dL (ref 0.3–1.2)

## 2011-01-16 LAB — BASIC METABOLIC PANEL
BUN: 16 mg/dL (ref 6–23)
CO2: 24 mEq/L (ref 19–32)
Calcium: 8.8 mg/dL (ref 8.4–10.5)
GFR: 68.27 mL/min (ref >60.00)
Glucose, Bld: 51 mg/dL — ABNORMAL LOW (ref 70–99)

## 2011-01-16 LAB — TSH: TSH: 1.81 u[IU]/mL (ref 0.35–5.50)

## 2011-01-24 ENCOUNTER — Encounter: Payer: Self-pay | Admitting: Gastroenterology

## 2011-01-24 ENCOUNTER — Ambulatory Visit (INDEPENDENT_AMBULATORY_CARE_PROVIDER_SITE_OTHER): Payer: 59 | Admitting: Gastroenterology

## 2011-01-24 ENCOUNTER — Other Ambulatory Visit: Payer: Self-pay | Admitting: Obstetrics and Gynecology

## 2011-01-24 VITALS — BP 122/76 | HR 60 | Ht 66.0 in | Wt 180.0 lb

## 2011-01-24 DIAGNOSIS — Z1231 Encounter for screening mammogram for malignant neoplasm of breast: Secondary | ICD-10-CM

## 2011-01-24 DIAGNOSIS — R1012 Left upper quadrant pain: Secondary | ICD-10-CM

## 2011-01-24 DIAGNOSIS — K921 Melena: Secondary | ICD-10-CM

## 2011-01-24 MED ORDER — PEG-KCL-NACL-NASULF-NA ASC-C 100 G PO SOLR: 1.0000 | Freq: Once | ORAL | Status: DC

## 2011-01-24 NOTE — Progress Notes (Signed)
History of Present Illness: This is a 50 year old female relates the onset of constant left upper quadrant abdominal pain that began in October and resolved after about 3-4 weeks. The pain was not affected by meals bowel movements or positions. It has not returned since resolving approximately 6 weeks ago. After left upper quadrant pain resolved she developed the onset of loose stools with occasional bright red blood and mucus per rectum. These symptoms have also improved over the past few weeks and stopped several days. Denies weight loss, constipation, change in stool caliber, melena, nausea, vomiting, dysphagia, reflux symptoms, chest pain.  Review of Systems: Pertinent positive and negative review of systems were noted in the above HPI section. All other review of systems were otherwise negative.  Current Medications, Allergies, Past Medical History, Past Surgical History, Family History and Social History were reviewed in [employer] record.  Physical Exam: General: Well developed , well nourished, no acute distress Head: Normocephalic and atraumatic Eyes:  sclerae anicteric, EOMI Ears: Normal auditory acuity Mouth: No deformity or lesions Neck: Supple, no masses or thyromegaly Lungs: Clear throughout to auscultation Heart: Regular rate and rhythm; no murmurs, rubs or bruits Abdomen: Soft, non tender and non distended. No masses, hepatosplenomegaly or hernias noted. Normal Bowel sounds Rectal: Deferred to colonoscopy Musculoskeletal: Symmetrical with no gross deformities  Skin: No lesions on visible extremities Pulses:  Normal pulses noted Extremities: No clubbing, cyanosis, edema or deformities noted Neurological: Alert oriented x 4, grossly nonfocal Cervical Nodes:  No significant cervical adenopathy Inguinal Nodes: No significant inguinal adenopathy Psychological:  Alert and cooperative. Normal mood and affect  Assessment and Recommendations:  1. Left upper  quadrant pain-resolved. Etiology unclear. Further evaluation with colonoscopy. If symptoms recur consider abdominal imaging studies.  2. Diarrhea, hematochezia. Rule out inflammatory bowel disease, colorectal neoplasms, self-limited  colitis and other disorders. Possibly ischemic colitis although more typically abdominal pain, diarrhea and bleeding occur together and have a shorter time course. The risks, benefits, and alternatives to colonoscopy with possible biopsy and possible polypectomy were discussed with the patient and they consent to proceed.

## 2011-01-24 NOTE — Patient Instructions (Signed)
You have been scheduled for a Colonoscopy with propofol sedation. See separate instructions. Pick up your prep kit from your pharmacy.  cc: Marga Melnick, MD

## 2011-01-25 ENCOUNTER — Other Ambulatory Visit (INDEPENDENT_AMBULATORY_CARE_PROVIDER_SITE_OTHER): Payer: 59

## 2011-01-25 DIAGNOSIS — Z1211 Encounter for screening for malignant neoplasm of colon: Secondary | ICD-10-CM

## 2011-01-25 LAB — HEMOCCULT GUIAC POC 1CARD (OFFICE)
Card #3 Fecal Occult Blood, POC: NEGATIVE
Fecal Occult Blood, POC: NEGATIVE

## 2011-02-16 ENCOUNTER — Ambulatory Visit
Admission: RE | Admit: 2011-02-16 | Discharge: 2011-02-16 | Disposition: A | Discharge status: Home / Self Care | Payer: 59 | Source: Ambulatory Visit | Attending: Obstetrics and Gynecology | Admitting: Obstetrics and Gynecology

## 2011-02-16 DIAGNOSIS — Z1231 Encounter for screening mammogram for malignant neoplasm of breast: Secondary | ICD-10-CM

## 2011-02-21 ENCOUNTER — Encounter: Payer: Self-pay | Admitting: Gastroenterology

## 2011-02-21 ENCOUNTER — Ambulatory Visit (AMBULATORY_SURGERY_CENTER): Payer: 59 | Admitting: Gastroenterology

## 2011-02-21 DIAGNOSIS — K921 Melena: Secondary | ICD-10-CM

## 2011-02-21 DIAGNOSIS — R1012 Left upper quadrant pain: Secondary | ICD-10-CM

## 2011-02-21 MED ORDER — SODIUM CHLORIDE 0.9 % IV SOLN: 500.0000 mL | INTRAVENOUS | Status: DC

## 2011-02-21 NOTE — Op Note (Signed)
[facility] [address] [place], [place]  [address]  COLONOSCOPY PROCEDURE REPORT  PATIENT:  Whitney Blake, Whitney Blake  MR#:  [id number] BIRTHDATE:  10-29-1960, 50 yrs. old  GENDER:  female ENDOSCOPIST:  Judie Petit T. Russella Dar, MD, [facility] Referred by:  Marga Melnick, M.D. PROCEDURE DATE:  02/21/2011 PROCEDURE:  Colonoscopy 19147 ASA CLASS:  Class II INDICATIONS:  1) hematochezia MEDICATIONS:   MAC sedation, administered by CRNA, propofol (Diprivan) 200 mg IV DESCRIPTION OF PROCEDURE:   After the risks benefits and alternatives of the procedure were thoroughly explained, informed consent was obtained.  Digital rectal exam was performed and revealed no abnormalities.   The [id number] endoscope was introduced through the anus and advanced to the cecum, which was identified by both the appendix and ileocecal valve, without limitations.  The quality of the prep was excellent, using MoviPrep.  The instrument was then slowly withdrawn as the colon was fully examined. <<PROCEDUREIMAGES>> FINDINGS:  A normal appearing cecum, ileocecal valve, and appendiceal orifice were identified. The ascending, hepatic flexure, transverse, splenic flexure, descending, sigmoid colon, and rectum appeared unremarkable.   Retroflexed views in the rectum revealed internal hemorrhoids, small.  The time to cecum = 2.33  minutes. The scope was then withdrawn (time =  9.5  min) from the patient and the procedure completed.  COMPLICATIONS:  None  ENDOSCOPIC IMPRESSION: 1) Normal colon 2) Internal hemorrhoids  RECOMMENDATIONS: 1) Continue current colorectal screening recommendations for "routine risk" patients with a repeat colonoscopy in 10 years.  Venita Lick. Russella Dar, MD, [facility] Graham  n. eSIGNED:   Venita Lick. Stark at 02/21/2011 03:47 PM  Tonye Royalty, [phone]

## 2011-02-21 NOTE — Progress Notes (Signed)
Patient did not experience any of the following events: a burn prior to discharge; a fall within the facility; wrong site/side/patient/procedure/implant event; or a hospital transfer or hospital admission upon discharge from the facility. (G8907) Patient did not have preoperative order for IV antibiotic SSI prophylaxis. (G8918)  

## 2011-02-21 NOTE — Patient Instructions (Signed)
Please refer to your blue and neon green sheets for instructions regarding diet and activity for the rest of today.  You may resume your medications as you would normally take them.   Hemorrhoids Hemorrhoids are enlarged (dilated) veins around the rectum. There are 2 types of hemorrhoids, and the type of hemorrhoid is determined by its location. Internal hemorrhoids occur in the veins just inside the rectum.They are usually not painful, but they may bleed.However, they may poke through to the outside and become irritated and painful. External hemorrhoids involve the veins outside the anus and can be felt as a painful swelling or hard lump near the anus.They are often itchy and may crack and bleed. Sometimes clots will form in the veins. This makes them swollen and painful. These are called thrombosed hemorrhoids. CAUSES Causes of hemorrhoids include:  Pregnancy. This increases the pressure in the hemorrhoidal veins.   Constipation.   Straining to have a bowel movement.   Obesity.   Heavy lifting or other activity that caused you to strain.  TREATMENT Most of the time hemorrhoids improve in 1 to 2 weeks. However, if symptoms do not seem to be getting better or if you have a lot of rectal bleeding, your caregiver may perform a procedure to help make the hemorrhoids get smaller or remove them completely.Possible treatments include:  Rubber band ligation. A rubber band is placed at the base of the hemorrhoid to cut off the circulation.   Sclerotherapy. A chemical is injected to shrink the hemorrhoid.   Infrared light therapy. Tools are used to burn the hemorrhoid.   Hemorrhoidectomy. This is surgical removal of the hemorrhoid.  HOME CARE INSTRUCTIONS   Increase fiber in your diet. Ask your caregiver about using fiber supplements.   Drink enough water and fluids to keep your urine clear or pale yellow.   Exercise regularly.   Go to the bathroom when you have the urge to have a  bowel movement. Do not wait.   Avoid straining to have bowel movements.   Keep the anal area dry and clean.   Only take over-the-counter or prescription medicines for pain, discomfort, or fever as directed by your caregiver.  If your hemorrhoids are thrombosed:  Take warm sitz baths for 20 to 30 minutes, 3 to 4 times per day.   If the hemorrhoids are very tender and swollen, place ice packs on the area as tolerated. Using ice packs between sitz baths may be helpful. Fill a plastic bag with ice. Place a towel between the bag of ice and your skin.   Medicated creams and suppositories may be used or applied as directed.   Do not use a donut-shaped pillow or sit on the toilet for long periods. This increases blood pooling and pain.  SEEK MEDICAL CARE IF:   You have increasing pain and swelling that is not controlled with your medicine.   You have uncontrolled bleeding.   You have difficulty or you are unable to have a bowel movement.   You have pain or inflammation outside the area of the hemorrhoids.   You have chills or an oral temperature above 102 F (38.9 C).  MAKE SURE YOU:   Understand these instructions.   Will watch your condition.   Will get help right away if you are not doing well or get worse.  Document Released: 01/28/2000 Document Revised: 10/12/2010 Document Reviewed: 06/04/2007 [employer] Patient Information 2012 [employer]. 

## 2011-02-22 ENCOUNTER — Telehealth: Payer: Self-pay | Admitting: *Deleted

## 2011-02-22 NOTE — Telephone Encounter (Signed)
Left message

## 2011-05-17 ENCOUNTER — Encounter: Payer: Self-pay | Admitting: Internal Medicine

## 2011-05-17 ENCOUNTER — Ambulatory Visit (INDEPENDENT_AMBULATORY_CARE_PROVIDER_SITE_OTHER): Payer: 59 | Admitting: Internal Medicine

## 2011-05-17 VITALS — BP 126/80 | HR 77 | Temp 98.2°F | Wt 177.2 lb

## 2011-05-17 DIAGNOSIS — J029 Acute pharyngitis, unspecified: Secondary | ICD-10-CM

## 2011-05-17 LAB — POCT RAPID STREP A (OFFICE): Rapid Strep A Screen: NEGATIVE

## 2011-05-17 MED ORDER — PREDNISONE 20 MG PO TABS: 20.0000 mg | ORAL_TABLET | Freq: Two times a day (BID) | ORAL | Status: AC

## 2011-05-17 MED ORDER — CEPHALEXIN 500 MG PO CAPS: 500.0000 mg | ORAL_CAPSULE | Freq: Two times a day (BID) | ORAL | Status: AC

## 2011-05-17 NOTE — Progress Notes (Signed)
  Subjective:    Patient ID: Whitney Blake, female    DOB: 1960-03-24, 51 y.o.   MRN: [id number]  HPI Sore Throat Onset/symptoms:05/16/11 as throat pain; she did have nausea w/o vomiting last week which resolved Exposures (illness/environmental/extrinsic):daughter had ST for a couple of weeks. Mono & Strep were negative Progression of symptoms:PNDrainage last night Treatments/response:Dayquil & Nyquil helped headache Present symptoms: Fever/chills/sweats:chills & sweating Frontal headache:yes Facial pain:no Nasal purulence:white with blood Dental pain:no Lymphadenopathy:no Wheezing/shortness of breath:no Cough/sputum/hemoptysis:no Associated extrinsic/allergic symptoms:itchy eyes/ sneezing:eyes had white discharge Past medical history: Seasonal allergies: no/asthma:no          Review of Systems     Objective:   Physical Exam General appearance:good health ;well nourished; no acute distress or increased work of breathing is present.  No  lymphadenopathy about the head, neck, or axilla noted.   Eyes: No conjunctival inflammation or lid edema is present. EOM &vision intact.  Ears:  External ear exam shows no significant lesions or deformities.  Otoscopic examination reveals clear canals, tympanic membranes are intact bilaterally without bulging, retraction, inflammation or discharge.  Nose:  External nasal examination shows no deformity or inflammation. Nasal mucosa are pink and moist without lesions or exudates. No septal dislocation or deviation.No obstruction to airflow.  Hyponasal speech  Oral exam: Dental hygiene is good; lips and gums are healthy appearing.There is marked  oropharyngeal erythema & slight edema of uvula .Whitish discharge R posterior pharynx    Neck:  No deformities, masses, or tenderness noted.   Supple with full range of motion without pain.   Heart:  Normal rate and regular rhythm. S1 and S2 normal without gallop, murmur, click, rub or other extra sounds.     Lungs:Chest clear to auscultation; no wheezes, rhonchi,rales ,or rubs present.No increased work of breathing.    Extremities:  No cyanosis, edema, or clubbing  noted    Skin: Warm & dry           Assessment & Plan:  #1 pharyngitis, severe with negative beta strep  Exposure to daughter who had protracted pharyngitis without definite etiology   Plan: See orders and recommendations

## 2011-05-17 NOTE — Patient Instructions (Signed)
Plain Mucinex for thick secretions ;force NON dairy fluids . Use a Neti pot daily as needed for sinus congestion. Nasal cleansing in the shower as discussed. Make sure that all residual soap is removed to prevent irritation. Zicam Melts or Zinc lozenges as per package . Report fever, exudate("pus") or progressive pain.

## 2011-07-07 ENCOUNTER — Telehealth: Payer: Self-pay | Admitting: *Deleted

## 2011-07-07 NOTE — Telephone Encounter (Signed)
Pt  Left vm advising that she has been taking Clonidine patches for her hot flashes for sometime now and wants to stop taking per no longer helping with her hot flashes, pt wants to know if she needs to taper off or just go cold [place], please advise

## 2011-07-07 NOTE — Telephone Encounter (Signed)
Left message on voicemail informing patient Dr.Hopper is out of the office. Message to be addressed on Monday

## 2011-07-08 NOTE — Telephone Encounter (Signed)
Med List states Clonidine 0.2 mg tabs. If on pills , decrease to 1/2 daily X 3 days then D/C. If on patches simply D/C

## 2011-07-11 NOTE — Telephone Encounter (Signed)
Left message to call office

## 2011-07-12 NOTE — Telephone Encounter (Signed)
Spoke with patient, patient ok'd Dr.Hopper's instructions  

## 2011-07-12 NOTE — Telephone Encounter (Signed)
Pt left msg on triage vmail returning your call.   Best # to call-[phone]

## 2011-09-28 ENCOUNTER — Encounter: Payer: Self-pay | Admitting: Internal Medicine

## 2011-09-28 ENCOUNTER — Ambulatory Visit (INDEPENDENT_AMBULATORY_CARE_PROVIDER_SITE_OTHER): Payer: 59 | Admitting: Internal Medicine

## 2011-09-28 VITALS — BP 150/98 | HR 61 | Temp 98.2°F | Wt 176.0 lb

## 2011-09-28 DIAGNOSIS — D649 Anemia, unspecified: Secondary | ICD-10-CM

## 2011-09-28 DIAGNOSIS — R079 Chest pain, unspecified: Secondary | ICD-10-CM

## 2011-09-28 DIAGNOSIS — R030 Elevated blood-pressure reading, without diagnosis of hypertension: Secondary | ICD-10-CM

## 2011-09-28 DIAGNOSIS — R002 Palpitations: Secondary | ICD-10-CM

## 2011-09-28 LAB — BASIC METABOLIC PANEL
GFR: 81.17 mL/min (ref >60.00)
Glucose, Bld: 65 mg/dL — ABNORMAL LOW (ref 70–99)
Potassium: 4.7 mEq/L (ref 3.5–5.1)
Sodium: 140 mEq/L (ref 135–145)

## 2011-09-28 LAB — CBC WITH DIFFERENTIAL/PLATELET
Basophils Absolute: 0 10*3/uL (ref 0.0–0.1)
Basophils Relative: 0.8 % (ref 0.0–3.0)
Hemoglobin: 13.9 g/dL (ref 12.0–15.0)
Lymphocytes Relative: 45.5 % (ref 12.0–46.0)
Monocytes Relative: 6.5 % (ref 3.0–12.0)
Neutro Abs: 2.5 10*3/uL (ref 1.4–7.7)
Neutrophils Relative %: 42.3 % — ABNORMAL LOW (ref 43.0–77.0)
RBC: 4.73 Mil/uL (ref 3.87–5.11)
RDW: 14.1 % (ref 11.5–14.6)

## 2011-09-28 LAB — T4, FREE: Free T4: 0.69 ng/dL (ref 0.60–1.60)

## 2011-09-28 MED ORDER — METOPROLOL TARTRATE 25 MG PO TABS: ORAL_TABLET | ORAL | Status: DC

## 2011-09-28 NOTE — Patient Instructions (Addendum)
Blood Pressure Goal  Ideally is an AVERAGE < 135/85. This AVERAGE should be calculated from @ least 5-7 BP readings taken @ different times of day on different days of week. You should not respond to isolated BP readings , but rather the AVERAGE for that week . To prevent palpitations or premature beats, avoid stimulants such as decongestants, diet pills, nicotine, or caffeine (coffee, tea, cola, or chocolate) to excess.   If you activate My Chart; the results can be released to you as soon as they populate from the lab. If you choose not to use this program; the labs have to be reviewed, copied & mailed   causing a delay in getting the results to you.  

## 2011-09-28 NOTE — Progress Notes (Signed)
Subjective:    Patient ID: Whitney Blake, female    DOB: 08-15-1960, 51 y.o.   MRN: [id number]  HPI She describes recurrent daily palpitations over the last several weeks which can last hours. These actually improved with exercise. She believes the trigger for this is lifestyle stresses. Specifically her husband had diverticulitis requiring surgery. She is his primary caregiver. Her father had a cerebrovascular bleed in the context of anticoagulation complicated  by seizures. She states she has some difficulty sleeping at night and she can feel a strong heartbeat.  Several days ago she had localized transient sharp pain in the left breast area; she denies exertional chest pain  The only stimulants she takes include  3 cups of coffee a day; she states she has decreased her coffee froma prior level of 5 or 6 cups a day.  Past medical history/family history/social history were all reviewed and updated. Pertinent data: Her mother has a pacer; father's had ablation for atrial fibrillation. She had extensive cardiac workup in 2007 for nonspecific ST-T changes including stress test  Her last extensive lab profile was 01/13/11; chemistries, electrolytes, and thyroid function were normal.    Review of Systems Constitutional:no fever or chills. Hot flashes. 20 # weight gain. No fatigue, sleep issues Cardiovascular:no associated chest pain, syncope, diaphoresis, claudication GI:no change in bowels, anorexia Derm: no skin, hair, or nail changes Neurologic:no tremor.Nocturnal numbness and tingling in hands Psych:no significant anxiety, depression, panic attacks Endocrine:no hoarseness. Some heat  intolerance      Objective:   Physical Exam Gen.: Healthy and well-nourished in appearance. Alert, appropriate and cooperative throughout exam. Eyes: No corneal or conjunctival inflammation noted.  Extraocular motion intact. No lid lag.  Neck: No deformities, masses, or tenderness noted. Range of motion  slightly decreased laterally. Thyroid  normal. Lungs: Normal respiratory effort; chest expands symmetrically. Lungs are clear to auscultation without rales, wheezes, or increased work of breathing. Heart: Normal rate and rhythm. Normal S1 and S2. No gallop, click, or rub. S4 w/o murmur.                                                                                   Musculoskeletal/extremities: No deformity or scoliosis noted of  the thoracic or lumbar spine. No clubbing, cyanosis, edema, or deformity noted. Tone & strength  normal.Joints normal. Nail health  good. Vascular: Carotid, radial artery, dorsalis pedis and  posterior tibial pulses are full and equal. No bruits present. Neurologic: Alert and oriented x3. Deep tendon reflexes symmetrical and normal.  No tremor         Skin: Intact without suspicious lesions or rashes. Lymph: No cervical, axillary lymphadenopathy present. Psych: Mood and affect are normal. Normally interactive  Assessment & Plan:  #1 palpitations, daily and persistent. These do resolve with exercise which is a positive finding.  #2 family history of heart disease with ablation for atrial fibrillation and pacemaker.  #3 elevated blood pressure without diagnosis of hypertension  Plan: She'll be asked to restrict stimulants. She was given low-dose metoprolol for palpitations. She will  be asked to monitor blood pressure. See orders and recommendations

## 2011-11-22 ENCOUNTER — Telehealth: Payer: Self-pay

## 2011-11-22 NOTE — Telephone Encounter (Signed)
Called pt back to advise:  Whitney Melnick, MD 11/22/2011 2:48 PM Signed  She should take the metoprolol 25 mg twice a day;if this is not controlling the blood pressure ; she should increase to one & one half pills every 12 hours. If blood pressure remains above 135/85 on average after 5-7 days; she should make an appointment and bring her blood pressure cuff,all meds and all readings.  Pt wrote down directions and stated understanding.        MW

## 2011-11-22 NOTE — Telephone Encounter (Signed)
She should take the metoprolol 25 mg twice a day;if this is not controlling the blood pressure ; she should increase to one & one half pills every 12 hours. If blood pressure remains above 135/85 on average after 5-7 days; she should make an appointment and bring her blood pressure cuff,all meds and all readings.

## 2011-11-22 NOTE — Telephone Encounter (Signed)
Pt states BP still high especially in evenings, other day169/94, Last night140/85. Pt asked how long you want her to monitor BP and what should she do concerning high BP? Pt states she can feels when BP is high because she feels the palpitations. Plz advise        MW

## 2011-12-06 ENCOUNTER — Telehealth: Payer: Self-pay | Admitting: Internal Medicine

## 2011-12-06 NOTE — Telephone Encounter (Signed)
Spoke with patient, scheduled appointment for tomorrow to further address her elevated B/P concerns

## 2011-12-06 NOTE — Telephone Encounter (Signed)
Caller: [name]/Patient; Patient Name: Whitney Blake; PCP: Marga Melnick; Best Callback Phone Number: [phone]  LMP: irregular-menopausal. Patient states she has hisotory of HTN. States Blood pressure has been elevated X 3 weeks. States was advised, per Dr. Alwyn Ren, to increase Lopressor from daily to BID. States was advised to monitor blood pressure and, if remained elevated above 135/80, to increase dosage of Lopressor to 1.5 tablets BID. Patient states blood pressure has been normal in the morning but remains elevated at night. States blood pressure was 172/80 1900 12/05/11. States blood pressure was 124/74 12/06/11 0630. Patient denies headache, dizziness or visual disturbances. Patient denies numbness or tingling. Patient denies chest pain or dyspnea. Patient states she feels "flush" when blood pressure elevates. Patient states she increased evening dosage of to Lopressor 25mg . 1.5 tablets 12/05/11 p.m. Patient is inquiring if she should increase morning dosage to 1.5 tablets since blood pressure reading was 124/74 12/06/11 a.m. Patient advised to increase Lopressor to 1.5 tablets BID as advised, per Dr. Alwyn Ren. Patient educated that increasing morning dosage, as advised, may help control evening blood pressure. Advised to monitor blood pressure readings. Triage per Hyptertension Protocol. No emergent sx identified. Care advice given per guidelines related to positive triage assessment for " Multiple elevated blood pressure readings without other sx and readings exceed expected range defined by treatment plan." Patient advised to return call within 72 hours for appt. if blood pressure readings remain elevated. Patient advised to return call sooner if sx increase. Call back parameters reviewed. Patient verbalizes understanding.

## 2011-12-07 ENCOUNTER — Encounter: Payer: Self-pay | Admitting: Internal Medicine

## 2011-12-07 ENCOUNTER — Ambulatory Visit (INDEPENDENT_AMBULATORY_CARE_PROVIDER_SITE_OTHER): Payer: 59 | Admitting: Internal Medicine

## 2011-12-07 VITALS — BP 144/82 | HR 63 | Wt 178.4 lb

## 2011-12-07 DIAGNOSIS — I10 Essential (primary) hypertension: Secondary | ICD-10-CM | POA: Insufficient documentation

## 2011-12-07 MED ORDER — HYDROCHLOROTHIAZIDE 12.5 MG PO CAPS: 12.5000 mg | ORAL_CAPSULE | Freq: Every day | ORAL | Status: DC

## 2011-12-07 MED ORDER — CARVEDILOL 25 MG PO TABS: ORAL_TABLET | ORAL | Status: DC

## 2011-12-07 NOTE — Patient Instructions (Addendum)
Please take enteric-coated aspirin 81 mg daily with breakfast. Blood Pressure Goal  Ideally is an AVERAGE < 135/85. This AVERAGE should be calculated from @ least 5-7 BP readings taken @ different times of day on different days of week. You should not respond to isolated BP readings , but rather the AVERAGE for that week

## 2011-12-07 NOTE — Progress Notes (Signed)
  Subjective:    Patient ID: Whitney Blake, female    DOB: [birth year], 51 y.o.   MRN: [id number]  HPI  HYPERTENSION: Disease Monitoring  Blood pressure range: 102-175/70-104 despite titration of Beta Blocker          Medication compliance: yes    Preventitive Healthcare:  Exercise: 75 min/ day   Diet Pattern:low fat, mod carbs  Salt Restriction: yes      Review of Systems Chest pain: no  Dyspnea: no   Claudication: no             Lightheadedness:no   Urinary frequency:no  Edema: no             Headache:dull for 36 hrs over crown              Epistaxis:no     Objective:   Physical Exam She appears healthy and well-nourished;s he is in no acute distress  No carotid bruits are present.  Heart rhythm and rate are normal ; grade 1/2 systolic murmur; no gallops.  Chest is clear with no increased work of breathing  There is no evidence of aortic aneurysm or renal artery bruits  He has no clubbing or edema.   Pedal pulses are intact   No ischemic skin changes are present         Assessment & Plan:

## 2011-12-07 NOTE — Assessment & Plan Note (Signed)
Clonidine has been effective for hot flashes. This in combination with low-dose beta blocker is not controlled her blood pressure. She be switched to the agent carvedilol and the dose titrated up to a maximum of 25 mg twice a day.  Important considerations is a strong family history of hypertension and history of stroke and heart failure in her mother.

## 2011-12-29 LAB — HM PAP SMEAR

## 2012-02-26 ENCOUNTER — Other Ambulatory Visit: Payer: Self-pay | Admitting: Obstetrics and Gynecology

## 2012-02-26 DIAGNOSIS — Z1231 Encounter for screening mammogram for malignant neoplasm of breast: Secondary | ICD-10-CM

## 2012-03-01 ENCOUNTER — Ambulatory Visit
Admission: RE | Admit: 2012-03-01 | Discharge: 2012-03-01 | Disposition: A | Discharge status: Home / Self Care | Payer: 59 | Source: Ambulatory Visit | Attending: Obstetrics and Gynecology | Admitting: Obstetrics and Gynecology

## 2012-03-01 DIAGNOSIS — Z1231 Encounter for screening mammogram for malignant neoplasm of breast: Secondary | ICD-10-CM

## 2012-04-04 ENCOUNTER — Telehealth: Payer: Self-pay | Admitting: Internal Medicine

## 2012-04-04 ENCOUNTER — Ambulatory Visit (INDEPENDENT_AMBULATORY_CARE_PROVIDER_SITE_OTHER): Payer: 59 | Admitting: Internal Medicine

## 2012-04-04 VITALS — BP 122/80 | HR 63 | Temp 98.2°F | Wt 177.0 lb

## 2012-04-04 DIAGNOSIS — J0100 Acute maxillary sinusitis, unspecified: Secondary | ICD-10-CM

## 2012-04-04 MED ORDER — AMOXICILLIN 500 MG PO CAPS: 500.0000 mg | ORAL_CAPSULE | Freq: Three times a day (TID) | ORAL | Status: DC

## 2012-04-04 NOTE — Patient Instructions (Addendum)

## 2012-04-04 NOTE — Progress Notes (Signed)
  Subjective:    Patient ID: Whitney Blake, female    DOB: 1960/05/06, 52 y.o.   MRN: [id number]  HPI The respiratory tract symptoms began 04/01/12   as sore throat then  head congestion w/o chest congestion .  Exposures to sick sister &  work associates .  Significant active  associated symptoms some include frontal headache, facial pain,  sore throat, & slight nasal purulence . Cough is  minimal without sputum production , shortness of breath, or wheezing .     Flu shot  current        Treatment with Cold ease, OTC drops, Tylenol & Mucinex was partially effective   There is no history of asthma ; some Fall seasonal  allergies. The patient had never smoked               Review of Systems Symptoms not present include dental pain , earache or  otic discharge  Fever, chills , or sweats not present   Itchy/  watery eyes &  sneezing were not noted.  Myalgias and arthralgias were not present     Objective:   Physical Exam General appearance:good health ;well nourished; no acute distress or increased work of breathing is present.   Eyes: No conjunctival inflammation or lid edema is present. EOMI & vision normal with lenses Ears:  External ear exam shows no significant lesions or deformities.  Otoscopic examination reveals clear canals, tympanic membranes are intact bilaterally without bulging, retraction, inflammation or discharge. Nose:  External nasal examination shows no deformity or inflammation. Nasal mucosa are pink and moist without lesions or exudates. No septal dislocation or deviation.No obstruction to airflow.  Oral exam: Dental hygiene is good; lips and gums are healthy appearing.There is no oropharyngeal erythema or exudate noted.  Neck:  No deformities, masses, or tenderness noted.   Supple with full range of motion without pain.  Heart:  Normal rate and regular rhythm. S1 and S2 normal without gallop, murmur, click, rub or other extra sounds.  Lungs:Chest  clear to auscultation; no wheezes, rhonchi,rales ,or rubs present.No increased work of breathing.   Extremities:  No cyanosis, edema, or clubbing  noted  No  lymphadenopathy about the head, neck, or axilla noted.  Skin: Warm & dry          Assessment & Plan:  #1 rhinosinusitis without significant bronchitis  Plan: Nasal hygiene interventions discussed. See prescription medications

## 2012-04-04 NOTE — Telephone Encounter (Signed)
Patient Information:  Caller Name: [name]  Phone: [phone]  Patient: Whitney Blake  Gender: Female  DOB: 12/12/1960  Age: 52 Years  PCP: Marga Melnick  Pregnant: No  Office Follow Up:  Does the office need to follow up with this patient?: No  Instructions For The Office: N/A  RN Note:  Menopausal/LMP 11/mid/2013. Reports sinus congestion with mild sinus pain and dry cough. Leaving for [place] to care for elderly parent this afternoon. Requesting RX for symptoms. Appointment scheduled.  Symptoms  Reason For Call & Symptoms: Called re nasal congestion and cough following sore throat for 24 hours. Co-workers have been sick for weeks.  Wants to get treated before leaves to take care of elderly Mom who just had surgery.  Reviewed Health History In EMR: Yes  Reviewed Medications In EMR: Yes  Reviewed Allergies In EMR: Yes  Reviewed Surgeries / Procedures: Yes  Date of Onset of Symptoms: 04/01/2012  Treatments Tried: Cold-Eze, Mucinex, cough drops, Tylenol  Treatments Tried Worked: No OB / GYN:  LMP: 01/03/2012  Guideline(s) Used:  Colds  Sinus Pain and Congestion  Disposition Per Guideline:   See Today or Tomorrow in Office  Reason For Disposition Reached:   Patient wants to be seen  Advice Given:  Reassurance:   Sinus congestion is a normal part of a cold.  Usually home treatment with nasal washes can prevent an actual bacterial sinus infection.  Antibiotics are not helpful for the sinus congestion that occurs with colds.  For a Runny Nose With Profuse Discharge:  Nasal mucus and discharge helps to wash viruses and bacteria out of the nose and sinuses.  For a Stuffy Nose - Use Nasal Washes:  Introduction: Saline (salt water) nasal irrigation (nasal wash) is an effective and simple home remedy for treating stuffy nose and sinus congestion. The nose can be irrigated by pouring, spraying, or squirting salt water into the nose and then letting it run back out.  Medicines  for a Stuffy or Runny Nose:  Most cold medicines that are available over-the-counter (OTC) are not helpful.  Pain and Fever Medicines:  For pain or fever relief, take either acetaminophen or ibuprofen.  Treat fevers above 101 F (38.3 C). The goal of fever therapy is to bring the fever down to a comfortable level. Remember that fever medicine usually lowers fever 2 degrees F (1 - 1 1/2 degrees C).  Expected Course:  Sinus congestion from viral upper respiratory infections (colds) usually lasts 5-10 days.  Occasionally a cold can worsen and turn into bacterial sinusitis. Clues to this are sinus symptoms lasting longer than 10 days, fever lasting longer than 3 days, and worsening pain. Bacterial sinusitis may need antibiotic treatment.  Call Back If:   You become worse.  Appointment Scheduled:  04/04/2012 16:15:00 Appointment Scheduled Provider:  Marga Melnick

## 2012-04-09 ENCOUNTER — Telehealth: Payer: Self-pay | Admitting: Internal Medicine

## 2012-04-09 NOTE — Telephone Encounter (Signed)
[facility] Triage Call Report Triage Record Num: [id number] Operator: Cornell Barman Patient Name: Whitney Blake Call Date & Time: 04/06/2012 3:13:37PM Patient Phone: [phone] PCP: Marga Melnick Patient Gender: Female PCP Fax : [phone] Patient DOB: [birth year] Practice Name: [facility] Reason for Call: Caller: Kadee/Patient; PCP: Marga Melnick; CB#: [phone]; Call regarding Being Treated for Sinus Infection Thursday 04/04/12; She was given Amoxicillen and Nasonex on Thursday by Dr. Alwyn Ren . X7 doses thus far. She states she is [place] at present. She awoke with Body Aches yesterday 04/05/12. Aches all over and could tell she was getting sick with fever 04/05/12. Temperature uknown, she does not have one. +headache, Drinking fluids about 4oz of water. Treated her self with Tylenol 1,500 every 4 hours. Sinus pressure is slight better with the Amoxicillen. Advised to stop 1,500mg  of Tylenol. Advised 650mg  every 4 hours. Emergent s/sx ruled out per Flu Like symtpoms with the exception to "Flu like illness and not in a high risk group". Overried see provider in 24 hours. ADvised [facility], out of town for [employer]. Caller expressed understanding encouraged fluids and drinking. Understanding expressed. Protocol(s) Used: Flu-Like Symptoms Protocol(s) Used: Upper Respiratory Infection (URI) Recommended Outcome per Protocol: Provide Home/Self Care Override Outcome if Used in Protocol: See Provider within 8 Hours RN Reason for Override Outcome: Nursing Judgement Used. Reason for Outcome: Sudden onset of flu-like symptoms Flu-like illness AND not in a high risk group Care Advice: ~ INFECTION CONTROL Antiviral medication can make the flu symptoms milder and shorten the time you have symptoms. Flu antiviral medications are recommended for individuals who have a greater chance of serious flu complications or who are very sick with the flu. They work best if they are started  within 2 days of getting sick. ~ See a provider immediately or go to the Emergency Department if having chest pain with breathing, change in level of consciousness, new seizure, vomiting and unable to keep fluids down for 8 hours or more, or has not urinated for 8 or more hours. ~ Influenza - Expected Course: - Symptoms start to improve in 3 to 7 days. - Cough and feeling tired (malaise) may continue for several weeks. - Cough and extreme fatigue lasting more than 3 weeks needs medical evaluation. - Remain at home at least 24 hours after being free of fever 100F (37.8C) without the use of fever reducing medication. ~ Influenza - Transmission: - Flu virus is spread through the air in droplets when a flu-infected person coughs, sneezes or talks and another person breathes in the droplets, or by touching a surface like a door knob, telephone, or keyboard that has been contaminated by the droplets and then touching the mouth, nose, or eyes. - An individual may be passing on the flu before they have any symptoms. - An individual may continue to be contagious with the flu for up to 7 days after the symptoms start. H1N1 influenza may continue to be contagious as long as cough persists. ~ 04/06/2012 3:26:19PM Page 1 of 2 [id number] Call-A-

## 2012-04-09 NOTE — Telephone Encounter (Signed)
Noted, patient was out of town at the time of call, patient was given instructions by RN

## 2012-06-09 ENCOUNTER — Other Ambulatory Visit: Payer: Self-pay | Admitting: Internal Medicine

## 2012-06-21 ENCOUNTER — Telehealth: Payer: Self-pay | Admitting: Internal Medicine

## 2012-06-21 NOTE — Telephone Encounter (Signed)
Noted. Encounter closed per MD protocol.

## 2012-06-21 NOTE — Telephone Encounter (Signed)
Patient Information:  Caller Name: Loray  Phone: [phone]  Patient: Whitney Blake  Gender: Female  DOB: 1961/01/19  Age: 52 Years  PCP: Marga Melnick  Pregnant: No  Office Follow Up:  Does the office need to follow up with this patient?: No  Instructions For The Office: N/A   Symptoms  Reason For Call & Symptoms: pt reports she had severe pain on the right side of abdomen that radiates to back.  Severe pain lasted 2 hours then she began vomiting.  Pt vomited twice and the pain was relieved some.  Pain is there (in her back and abdomen).  pt rates her pain as 1/10 and states her pain has been constant since 06/20/12  Reviewed Health History In EMR: Yes  Reviewed Medications In EMR: Yes  Reviewed Allergies In EMR: Yes  Reviewed Surgeries / Procedures: Yes  Date of Onset of Symptoms: 06/20/2012 OB / GYN:  LMP: Unknown  Guideline(s) Used:  Abdominal Pain - Female  Disposition Per Guideline:   Go to ED Now (or to Office with PCP Approval)  Reason For Disposition Reached:   Constant abdominal pain lasting > 2 hours  Advice Given:  RN advised that pt should be seen in the ED. (all appts were booked)  Pt states she will go to [facility]  Patient Will Follow Care Advice:  YES

## 2012-06-22 ENCOUNTER — Emergency Department (HOSPITAL_COMMUNITY): Payer: 59

## 2012-06-22 ENCOUNTER — Emergency Department (HOSPITAL_COMMUNITY)
Admission: EM | Admit: 2012-06-22 | Discharge: 2012-06-22 | Disposition: A | Discharge status: Home / Self Care | Payer: 59 | Attending: Emergency Medicine | Admitting: Emergency Medicine

## 2012-06-22 ENCOUNTER — Encounter (HOSPITAL_COMMUNITY): Payer: Self-pay | Admitting: *Deleted

## 2012-06-22 DIAGNOSIS — M549 Dorsalgia, unspecified: Secondary | ICD-10-CM | POA: Insufficient documentation

## 2012-06-22 DIAGNOSIS — Z8739 Personal history of other diseases of the musculoskeletal system and connective tissue: Secondary | ICD-10-CM | POA: Insufficient documentation

## 2012-06-22 DIAGNOSIS — Z8781 Personal history of (healed) traumatic fracture: Secondary | ICD-10-CM | POA: Insufficient documentation

## 2012-06-22 DIAGNOSIS — N201 Calculus of ureter: Secondary | ICD-10-CM

## 2012-06-22 DIAGNOSIS — Z8679 Personal history of other diseases of the circulatory system: Secondary | ICD-10-CM | POA: Insufficient documentation

## 2012-06-22 DIAGNOSIS — R112 Nausea with vomiting, unspecified: Secondary | ICD-10-CM | POA: Insufficient documentation

## 2012-06-22 DIAGNOSIS — Z87440 Personal history of urinary (tract) infections: Secondary | ICD-10-CM | POA: Insufficient documentation

## 2012-06-22 DIAGNOSIS — Z862 Personal history of diseases of the blood and blood-forming organs and certain disorders involving the immune mechanism: Secondary | ICD-10-CM | POA: Insufficient documentation

## 2012-06-22 DIAGNOSIS — Z9889 Other specified postprocedural states: Secondary | ICD-10-CM | POA: Insufficient documentation

## 2012-06-22 DIAGNOSIS — I10 Essential (primary) hypertension: Secondary | ICD-10-CM | POA: Insufficient documentation

## 2012-06-22 DIAGNOSIS — Z79899 Other long term (current) drug therapy: Secondary | ICD-10-CM | POA: Insufficient documentation

## 2012-06-22 DIAGNOSIS — N200 Calculus of kidney: Secondary | ICD-10-CM | POA: Insufficient documentation

## 2012-06-22 LAB — URINALYSIS, ROUTINE W REFLEX MICROSCOPIC
Nitrite: NEGATIVE
Specific Gravity, Urine: 1.027 (ref 1.005–1.030)
Urobilinogen, UA: 0.2 mg/dL (ref 0.0–1.0)

## 2012-06-22 LAB — COMPREHENSIVE METABOLIC PANEL
CO2: 32 mEq/L (ref 19–32)
Calcium: 10.2 mg/dL (ref 8.4–10.5)
Creatinine, Ser: 0.92 mg/dL (ref 0.50–1.10)
GFR calc Af Amer: 82 mL/min — ABNORMAL LOW (ref >90)
GFR calc non Af Amer: 70 mL/min — ABNORMAL LOW (ref >90)
Glucose, Bld: 98 mg/dL (ref 70–99)

## 2012-06-22 LAB — CBC WITH DIFFERENTIAL/PLATELET
Basophils Absolute: 0.1 10*3/uL (ref 0.0–0.1)
Eosinophils Relative: 4 % (ref 0–5)
HCT: 42.8 % (ref 36.0–46.0)
Lymphocytes Relative: 47 % — ABNORMAL HIGH (ref 12–46)
Lymphs Abs: 4.5 10*3/uL — ABNORMAL HIGH (ref 0.7–4.0)
MCV: 86.5 fL (ref 78.0–100.0)
Monocytes Absolute: 0.5 10*3/uL (ref 0.1–1.0)
RDW: 13.3 % (ref 11.5–15.5)
WBC: 9.6 10*3/uL (ref 4.0–10.5)

## 2012-06-22 LAB — URINE MICROSCOPIC-ADD ON

## 2012-06-22 MED ORDER — ONDANSETRON HCL 4 MG/2ML IJ SOLN
4.0000 mg | Freq: Once | INTRAMUSCULAR | Status: AC
  Administered 2012-06-22: 4 mg via INTRAVENOUS
  Filled 2012-06-22: qty 2

## 2012-06-22 MED ORDER — FENTANYL CITRATE 0.05 MG/ML IJ SOLN
50.0000 ug | Freq: Once | INTRAMUSCULAR | Status: AC
  Administered 2012-06-22: 50 ug via INTRAVENOUS
  Filled 2012-06-22: qty 2

## 2012-06-22 MED ORDER — HYDROMORPHONE HCL PF 1 MG/ML IJ SOLN
1.0000 mg | Freq: Once | INTRAMUSCULAR | Status: AC
  Administered 2012-06-22: 1 mg via INTRAVENOUS
  Filled 2012-06-22: qty 1

## 2012-06-22 MED ORDER — LORAZEPAM 1 MG PO TABS
1.0000 mg | ORAL_TABLET | Freq: Once | ORAL | Status: AC
  Administered 2012-06-22: 1 mg via ORAL
  Filled 2012-06-22: qty 1

## 2012-06-22 MED ORDER — SODIUM CHLORIDE 0.9 % IV SOLN
1000.0000 mL | Freq: Once | INTRAVENOUS | Status: AC
  Administered 2012-06-22: 1000 mL via INTRAVENOUS

## 2012-06-22 MED ORDER — TAMSULOSIN HCL 0.4 MG PO CAPS: 0.4000 mg | ORAL_CAPSULE | Freq: Every day | ORAL | Status: DC

## 2012-06-22 MED ORDER — ONDANSETRON HCL 4 MG PO TABS: 4.0000 mg | ORAL_TABLET | Freq: Four times a day (QID) | ORAL | Status: DC

## 2012-06-22 MED ORDER — OXYCODONE-ACETAMINOPHEN 5-325 MG PO TABS: 1.0000 | ORAL_TABLET | Freq: Four times a day (QID) | ORAL | Status: DC | PRN

## 2012-06-22 NOTE — ED Notes (Signed)
Pt states she's having severe R flank pain radiating around to back area, states happen Thursday but went away, had some abdominal pain Friday, then tonight around 8 pm started having the severe pain again w/ nausea and vomiting. States 2 months ago had UTI, took antibiotic and it went away w/o difficulty.

## 2012-06-22 NOTE — ED Provider Notes (Signed)
History     CSN: [id number]  Arrival date & time 06/22/12  2109   First MD Initiated Contact with Patient 06/22/12 2147      Chief Complaint  Patient presents with  . Flank Pain    (Consider location/radiation/quality/duration/timing/severity/associated sxs/prior treatment) HPI Comments: 52 y/o female presents to the ED complaining of sudden onset right sided flank pain x 2 hours. Patient states shortly after eating dinner tonight she developed right sided back pain radiating around her flank to her groin, described as sharp rated 10/10, decreased to 5/10 with pain medication given in ED. Admits to associated nausea and vomiting, last episode being in triage. Had the same dinner as the rest of her family who is fine. States she had similar symptoms 2 days ago for about 2 hours, vomited, and symptoms subsided without intervention. Denies fever, chills, increased urinary frequency, urgency, dysuria, hematuria, vaginal bleeding or discharge, pelvic pain. LMP 6 months ago. She was treated for a UTI 2 months ago. No history of kidney stones. Only abdominal surgery was a c-section "many years ago". Patient reportedly in distress in triage, given fentanyl and zofran.   Patient is a 52 y.o. female presenting with flank pain. The history is provided by the patient and the spouse.  Flank Pain Associated symptoms include abdominal pain, nausea and vomiting. Pertinent negatives include no chills or fever.    Past Medical History  Diagnosis Date  . Nonspecific ST-T changes 2007  . Normal nuclear stress test 2007  . Cervical spinal stenosis     Dr Lovell Shee[place]  . Migraines     menstrual  . Anemia     PMH of  . Hypertension     Past Surgical History  Procedure Laterality Date  . Tonsillectomy and adenoidectomy    . Orif clavicular fracture      bilaterally @ age 14  . Concussion      DUE TO FALL AT AGE 26  . Lumbar laminectomy  2003    FOR RUPTURED DISC  . Foot surgery      Right      Family History  Problem Relation Age of Onset  . Asthma Father   . Hypertension Father   . Colon polyps Father   . Heart disease Father     S/P ablation for AF  . Stroke Father     cns bleed on coumadin  . Hypertension Mother     bilateral endarterectomies,pacer,mesenteric artery bypass, renal artery stenosis stented unilaterally, intestinal perforation, steroids for colitis induced osteoprosis, polyps   . Cancer Mother     uterine S/P Tamoxifen  . Stroke Mother     in early 76's  . Colon polyps Mother   . Irritable bowel syndrome Mother   . Breast cancer Mother   . Heart disease Mother     pacer  . Hypertension Sister     2 sisters  . Cancer Maternal Grandmother     uterine  . Diabetes Maternal Grandmother   . Cancer Sister     cervical  . Colon cancer Neg Hx   . Transient ischemic attack Mother     History  Substance Use Topics  . Smoking status: Never Smoker   . Smokeless tobacco: Never Used  . Alcohol Use: No    OB History   Grav Para Term Preterm Abortions TAB SAB Ect Mult Living   2 2             Obstetric Comments  C-Section x1      Review of Systems  Constitutional: Negative for fever and chills.  Gastrointestinal: Positive for nausea, vomiting and abdominal pain.  Genitourinary: Positive for flank pain. Negative for urgency, frequency, hematuria, decreased urine volume, vaginal bleeding, vaginal discharge, difficulty urinating, vaginal pain and pelvic pain.  Musculoskeletal: Positive for back pain.  All other systems reviewed and are negative.    Allergies  Rofecoxib  Home Medications   Current Outpatient Rx  Name  Route  Sig  Dispense  Refill  . carvedilol (COREG) 25 MG tablet   Oral   Take 25 mg by mouth 2 (two) times daily with a meal.         . hydrochlorothiazide (MICROZIDE) 12.5 MG capsule      TAKE ONE CAPSULE BY MOUTH DAILY   30 capsule   5   . RASPBERRY KETONES PO   Oral   Take 1-2 capsules by mouth 2 (two) times  daily. Says it is the first time she took it was today.           BP 169/89  Pulse 70  Temp(Src) 98.2 F (36.8 C) (Oral)  Resp 20  Ht 5\' 5"  (1.651 m)  Wt 168 lb (76.204 kg)  BMI 27.96 kg/m2  SpO2 99%  Physical Exam  Nursing note and vitals reviewed. Constitutional: She is oriented to person, place, and time. She appears well-developed and well-nourished. No distress.  HENT:  Head: Normocephalic and atraumatic.  Mouth/Throat: Oropharynx is clear and moist.  Eyes: Conjunctivae are normal. No scleral icterus.  Neck: Normal range of motion. Neck supple.  Cardiovascular: Normal rate, regular rhythm and normal heart sounds.   Pulmonary/Chest: Effort normal and breath sounds normal. No respiratory distress.  Abdominal: Soft. Normal appearance and bowel sounds are normal. She exhibits no distension and no mass. There is tenderness. There is no rigidity, no rebound, no guarding, no CVA tenderness and negative Murphy's sign.    Musculoskeletal: Normal range of motion. She exhibits no edema.  Neurological: She is alert and oriented to person, place, and time.  Skin: Skin is warm and dry. She is not diaphoretic. No pallor.  Psychiatric: She has a normal mood and affect. Her behavior is normal.    ED Course  Procedures (including critical care time)  Labs Reviewed  CBC WITH DIFFERENTIAL - Abnormal; Notable for the following:    Lymphocytes Relative 47 (*)    Lymphs Abs 4.5 (*)    All other components within normal limits  COMPREHENSIVE METABOLIC PANEL - Abnormal; Notable for the following:    Total Bilirubin 0.2 (*)    GFR calc non Af Amer 70 (*)    GFR calc Af Amer 82 (*)    All other components within normal limits  URINALYSIS, ROUTINE W REFLEX MICROSCOPIC - Abnormal; Notable for the following:    APPearance CLOUDY (*)    Hgb urine dipstick LARGE (*)    Leukocytes, UA SMALL (*)    All other components within normal limits  URINE MICROSCOPIC-ADD ON - Abnormal; Notable for the  following:    Squamous Epithelial / LPF MANY (*)    All other components within normal limits   Ct Abdomen Pelvis Wo Contrast  06/22/2012  *RADIOLOGY REPORT*  Clinical Data: Right flank pain and nausea.  CT ABDOMEN AND PELVIS WITHOUT CONTRAST  Technique:  Multidetector CT imaging of the abdomen and pelvis was performed following the standard protocol without intravenous contrast.  Comparison: None.  Findings: The lung  bases are clear.  No pleural or pericardial effusion.  The patient has moderate right hydronephrosis due to a 0.4 cm stone at the level of the L4-5 disc interspace.  No other right urinary tract stones are identified.  There is a punctate nonobstructing stone in the lower pole of the left kidney.  No left ureteral stones or left hydronephrosis.  The liver, gallbladder, spleen, adrenal glands and pancreas appear normal.  Uterus, adnexa and urinary bladder are unremarkable.  The stomach, small and large bowel and appendix are unremarkable.  No lymphadenopathy or fluid is identified.  There is no focal bony abnormality.  IMPRESSION:  1.  Moderate right hydronephrosis due to a 0.4 cm stone at the level of the L4-5 disc interspace. 2.  Punctate nonobstructing stone lower pole left kidney.   Original Report Authenticated By: Holley Dexter, M.D.      1. Kidney stone   2. Ureteral stone       MDM  52 y/o female with 4 mm stone at L4-5 disc interspace, punctate nonobstructing stone lower pole left kidney. No UTI.  Pain returned in ED. Dilaudid given. Patient in NAD after dose of dilaudid. Urine strainer given. Rx percocet, zofran, flomax. She will f/u with urology. Return precautions discussed. Patient and husband state understanding of plan and are agreeable.        Trevor Mace, PA-C 06/22/12 2315

## 2012-06-22 NOTE — ED Provider Notes (Signed)
Medical screening examination/treatment/procedure(s) were performed by non-physician practitioner and as supervising physician I was immediately available for consultation/collaboration.   Loren Racer, MD 06/22/12 [phone]

## 2012-06-22 NOTE — ED Notes (Signed)
Pt c/o R flank pain x 2 hrs on Thursday w/ vomiting then resolved. Pt c/o sudden onset of R flank pain tonight, accompanied by vomiting and nausea. Pt is pale, diaphoretic and in obvious distress upon presentation.

## 2012-06-25 ENCOUNTER — Encounter: Payer: Self-pay | Admitting: Internal Medicine

## 2012-09-06 ENCOUNTER — Ambulatory Visit (INDEPENDENT_AMBULATORY_CARE_PROVIDER_SITE_OTHER): Payer: 59 | Admitting: Internal Medicine

## 2012-09-06 ENCOUNTER — Encounter: Payer: Self-pay | Admitting: Internal Medicine

## 2012-09-06 VITALS — BP 118/70 | HR 59 | Temp 98.1°F | Wt 177.0 lb

## 2012-09-06 DIAGNOSIS — N200 Calculus of kidney: Secondary | ICD-10-CM | POA: Insufficient documentation

## 2012-09-06 DIAGNOSIS — G43909 Migraine, unspecified, not intractable, without status migrainosus: Secondary | ICD-10-CM

## 2012-09-06 MED ORDER — SUMATRIPTAN SUCCINATE 50 MG PO TABS: ORAL_TABLET | ORAL | Status: DC

## 2012-09-06 NOTE — Progress Notes (Signed)
Subjective:    Patient ID: Whitney Blake, female    DOB: 02/16/1960, 52 y.o.   MRN: [id number]  HPI   This morning she had paroxysmal right hemianopsia which lasted approximately 30 minutes and was not associated with pain. Approximately 2 hours later she developed a nagging headache which responded to Tylenol. This was associated with nausea. She did note photosensitivity  She has had migraines since onset of menses decades ago. The typical pattern would be wavy vision for several minutes followed by hemianopsia. Subsequently she would develop a nagging headache which would be partially responsive to Tylenol.  The pattern today was atypical in that she did not have the prodrome of wavy vision but developed a hemianopsia first.  Her mother had migraines.She aspirated in post PVD surgical period.    Review of Systems   She had no associated fever, chills, or sweats. She is having menopausal sweats.  She denies any diplopia with the event.  She had no ringing in ears or hearing loss.  She denies weakness, numbness, tingling in her limbs.  She's had no incontinence of urine or stool.  She denies significant anxiety, panic attacks, or anhedonia. She describes situational depression related to the health issues with her parents.  She has no trouble going to sleep but is awakened by hot flashes within one hour.     Objective:   Physical Exam Gen.: Healthy and well-nourished in appearance. Alert, appropriate and cooperative throughout exam.  Head: Normocephalic without obvious abnormalities Eyes: No corneal or conjunctival inflammation noted. Pupils equal round reactive to light and accommodation. Field of vision is normal; this was verified twice. She has no nystagmus. No lid lag or proptosis present. Extraocular motion intact. Vision grossly normal without lenses Ears: External  ear exam reveals no significant lesions or deformities. Canals clear .TMs normal. Hearing is grossly normal  bilaterally. Tuning fork exam revealed no lateralization. Bone conduction is greater than air conduction bilaterally. Nose: External nasal exam reveals no deformity or inflammation. Nasal mucosa are pink and moist. No lesions or exudates noted.  Mouth: Oral mucosa and oropharynx reveal no lesions or exudates. Teeth in good repair. Neck: No deformities, masses, or tenderness noted. Range of motion normal. Lungs: Normal respiratory effort; chest expands symmetrically. Lungs are clear to auscultation without rales, wheezes, or increased work of breathing. Heart: Normal rate and rhythm. Normal S1 and S2. No gallop, click, or rub. No murmur.                                  Musculoskeletal/extremities: No deformity or scoliosis noted of  the thoracic or lumbar spine.   No clubbing, cyanosis, edema, or significant extremity  deformity noted. Tone & strength  Normal. Joints reveal minor  DJD DIP changes. Nail health good.  Vascular: Carotid, radial artery pulses are full and equal. No bruits present. Neurologic: Alert and oriented x3. Deep tendon reflexes symmetrical and normal.  Gait normal  including heel & toe walking .    Romberg and finger to nose testing normal.    Skin: Intact without suspicious lesions or rashes. Lymph: No cervical, axillary lymphadenopathy present. Psych: Mood and affect are normal. Normally interactive  Assessment & Plan:  #1 visual migraines; it was atypical in the sense that she did not have her usual prodrome of wavy vision. No neurologic deficit present  #2 abnormal tuning fork exam bilaterally.  #3  Peri menopausal status  Plan: Excedrin Migraine would be recommended with the prodrome. She'll be asked to keep a diary of her headaches. Headache Clinic referral

## 2012-09-06 NOTE — Patient Instructions (Addendum)
Please keep a diary of your headaches . Document  each occurrence on the calendar with notation of : #1 any prodrome ( any non headache symptom such as marked fatigue,visual changes, ,etc ) which precedes actual headache ; #2) severity on 1-10 scale; #3) any triggers ( food/ drink,enviromenntal or weather changes ,physical or emotional stress) in 8-12 hour period prior to the headache; & #4) response to any medications or other intervention. Please review "Headache" @ [facility] for additional information.    Use Excedrin Migraine for any prodrome such as wavy the vision or loss of vision. Do not use Excedrin Migraine for the headache.

## 2012-10-28 ENCOUNTER — Encounter: Payer: Self-pay | Admitting: Internal Medicine

## 2012-12-04 ENCOUNTER — Encounter: Payer: Self-pay | Admitting: Internal Medicine

## 2012-12-13 ENCOUNTER — Other Ambulatory Visit: Payer: Self-pay | Admitting: Internal Medicine

## 2012-12-13 NOTE — Telephone Encounter (Signed)
Hydrochlorothiazide refill sent to pharmacy 

## 2012-12-19 ENCOUNTER — Other Ambulatory Visit: Payer: Self-pay

## 2013-01-21 ENCOUNTER — Other Ambulatory Visit: Payer: Self-pay | Admitting: Internal Medicine

## 2013-01-22 NOTE — Telephone Encounter (Signed)
Carvedilol refilled per protocol 

## 2013-03-04 ENCOUNTER — Encounter: Payer: Self-pay | Admitting: Internal Medicine

## 2013-03-18 ENCOUNTER — Ambulatory Visit (INDEPENDENT_AMBULATORY_CARE_PROVIDER_SITE_OTHER): Payer: 59 | Admitting: Internal Medicine

## 2013-03-18 ENCOUNTER — Encounter: Payer: Self-pay | Admitting: Internal Medicine

## 2013-03-18 ENCOUNTER — Other Ambulatory Visit (INDEPENDENT_AMBULATORY_CARE_PROVIDER_SITE_OTHER): Payer: 59

## 2013-03-18 VITALS — BP 138/92 | HR 58 | Temp 97.8°F | Ht 65.5 in | Wt 180.8 lb

## 2013-03-18 DIAGNOSIS — N951 Menopausal and female climacteric states: Secondary | ICD-10-CM

## 2013-03-18 DIAGNOSIS — Z0000 Encounter for general adult medical examination without abnormal findings: Secondary | ICD-10-CM

## 2013-03-18 LAB — LIPID PANEL
CHOLESTEROL: 187 mg/dL (ref 0–200)
HDL: 56.4 mg/dL (ref >39.00)
LDL Cholesterol: 101 mg/dL — ABNORMAL HIGH (ref 0–99)
TRIGLYCERIDES: 147 mg/dL (ref 0.0–149.0)
Total CHOL/HDL Ratio: 3
VLDL: 29.4 mg/dL (ref 0.0–40.0)

## 2013-03-18 LAB — TSH: TSH: 2.33 u[IU]/mL (ref 0.35–5.50)

## 2013-03-18 MED ORDER — CITALOPRAM HYDROBROMIDE 10 MG PO TABS: 10.0000 mg | ORAL_TABLET | Freq: Every day | ORAL | Status: DC

## 2013-03-18 NOTE — Progress Notes (Signed)
Pre visit review using our clinic review tool, if applicable. No additional management support is needed unless otherwise documented below in the visit note. 

## 2013-03-18 NOTE — Patient Instructions (Signed)
Your next office appointment will be determined based upon review of your pending labs . Those instructions will be transmitted to you through My Chart   Minimal Blood Pressure Goal= AVERAGE < 140/90;  Ideal is an AVERAGE < 135/85. This AVERAGE should be calculated from @ least 5-7 BP readings taken @ different times of day on different days of week. You should not respond to isolated BP readings , but rather the AVERAGE for that week .Please bring your  blood pressure cuff to office visits to verify that it is reliable.It  can also be checked against the blood pressure device at the pharmacy. Finger or wrist cuffs are not dependable; an arm cuff is.  Take the EKG to any emergency room or preop visits. There are nonspecific changes; as long as there is no new change these are not clinically significant . If the old EKG is not available for comparison; it may result in unnecessary hospitalization for observation with significant unnecessary expense. 

## 2013-03-18 NOTE — Progress Notes (Signed)
   Subjective:    Patient ID: Whitney Blake, female    DOB: 01/26/1961, 53 y.o.   MRN: [id number]  HPI  She is here for a physical;acute issues include hot flashes.   Review of Systems  Blood pressure range  :125-150/low 90s Compliant with anti hypertemsive medication except Coreg D/Ced as it ? Made flashes worse. No lightheadedness  with medication .  Significant headaches, epistaxis, chest pain, palpitations, exertional dyspnea, claudication, paroxysmal nocturnal dyspnea, or edema absent. [employer] 2X/ week for 45 minutes.      Objective:   Physical Exam  Gen.: Healthy and well-nourished in appearance. Alert, appropriate and cooperative throughout exam. Appears younger than stated age  Head: Normocephalic without obvious abnormalities Eyes: No corneal or conjunctival inflammation noted. Pupils equal round reactive to light and accommodation. Extraocular motion intact.  Ears: External  ear exam reveals no significant lesions or deformities. Canals clear .TMs normal. Hearing is grossly normal bilaterally. Nose: External nasal exam reveals no deformity or inflammation. Nasal mucosa are pink and moist. No lesions or exudates noted.   Mouth: Oral mucosa and oropharynx reveal no lesions or exudates. Teeth in good repair. Neck: No deformities, masses, or tenderness noted. Range of motion & Thyroid normal. Lungs: Normal respiratory effort; chest expands symmetrically. Lungs are clear to auscultation without rales, wheezes, or increased work of breathing. Heart: Slow rate and regular rhythm. Normal S1 and S2. No gallop, click, or rub. S 4 w/o murmur. Abdomen: Bowel sounds normal; abdomen soft and nontender. No masses, organomegaly or hernias noted. Genitalia:  as per Gyn                                  Musculoskeletal/extremities: No deformity or scoliosis noted of  the thoracic or lumbar spine.   No clubbing, cyanosis, edema, or significant extremity  deformity noted. Range of motion normal  .Tone & strength normal. Hand joints normal . Fingernail  health good. Able to lie down & sit up w/o help. Negative SLR bilaterally Vascular: Carotid, radial artery, dorsalis pedis and  posterior tibial pulses are full and equal. No bruits present. Neurologic: Alert and oriented x3. Deep tendon reflexes symmetrical and normal.    Skin: Intact without suspicious lesions or rashes. Lymph: No cervical, axillary lymphadenopathy present. Psych: Mood and affect are normal. Normally interactive                                                                                        Assessment & Plan:  #1 comprehensive physical exam; no acute findings #2 trial of SSRI for hot flashes  Plan: see Orders  & Recommendations

## 2013-04-14 ENCOUNTER — Other Ambulatory Visit: Payer: Self-pay | Admitting: Internal Medicine

## 2013-04-14 ENCOUNTER — Encounter: Payer: Self-pay | Admitting: Internal Medicine

## 2013-04-14 MED ORDER — GABAPENTIN 100 MG PO CAPS: ORAL_CAPSULE | ORAL | Status: DC

## 2013-04-17 ENCOUNTER — Other Ambulatory Visit: Payer: Self-pay

## 2013-04-17 DIAGNOSIS — Z803 Family history of malignant neoplasm of breast: Secondary | ICD-10-CM

## 2013-04-17 DIAGNOSIS — Z1231 Encounter for screening mammogram for malignant neoplasm of breast: Secondary | ICD-10-CM

## 2013-05-01 ENCOUNTER — Ambulatory Visit
Admission: RE | Admit: 2013-05-01 | Discharge: 2013-05-01 | Disposition: A | Discharge status: Home / Self Care | Payer: 59 | Source: Ambulatory Visit

## 2013-05-01 DIAGNOSIS — Z803 Family history of malignant neoplasm of breast: Secondary | ICD-10-CM

## 2013-05-01 DIAGNOSIS — Z1231 Encounter for screening mammogram for malignant neoplasm of breast: Secondary | ICD-10-CM

## 2013-05-07 ENCOUNTER — Other Ambulatory Visit: Payer: Self-pay | Admitting: Internal Medicine

## 2013-05-07 MED ORDER — GABAPENTIN 300 MG PO CAPS: ORAL_CAPSULE | ORAL | Status: DC

## 2013-06-16 ENCOUNTER — Other Ambulatory Visit: Payer: Self-pay

## 2013-06-16 ENCOUNTER — Encounter: Payer: Self-pay | Admitting: Internal Medicine

## 2013-06-16 MED ORDER — HYDROCHLOROTHIAZIDE 12.5 MG PO CAPS: ORAL_CAPSULE | ORAL | Status: DC

## 2013-09-12 ENCOUNTER — Encounter: Payer: Self-pay | Admitting: Gastroenterology

## 2013-09-22 ENCOUNTER — Other Ambulatory Visit: Payer: Self-pay | Admitting: Internal Medicine

## 2013-11-03 ENCOUNTER — Encounter: Payer: Self-pay | Admitting: Internal Medicine

## 2013-11-26 ENCOUNTER — Ambulatory Visit (INDEPENDENT_AMBULATORY_CARE_PROVIDER_SITE_OTHER): Payer: 59 | Admitting: Gastroenterology

## 2013-11-26 ENCOUNTER — Encounter: Payer: Self-pay | Admitting: Gastroenterology

## 2013-11-26 VITALS — BP 134/68 | HR 64 | Ht 65.25 in | Wt 177.5 lb

## 2013-11-26 DIAGNOSIS — K921 Melena: Secondary | ICD-10-CM

## 2013-11-26 DIAGNOSIS — K589 Irritable bowel syndrome without diarrhea: Secondary | ICD-10-CM

## 2013-11-26 DIAGNOSIS — K6289 Other specified diseases of anus and rectum: Secondary | ICD-10-CM

## 2013-11-26 DIAGNOSIS — R197 Diarrhea, unspecified: Secondary | ICD-10-CM

## 2013-11-26 MED ORDER — HYDROCORTISONE ACETATE 25 MG RE SUPP: 25.0000 mg | Freq: Two times a day (BID) | RECTAL | Status: DC

## 2013-11-26 MED ORDER — GLYCOPYRROLATE 1 MG PO TABS: 1.0000 mg | ORAL_TABLET | Freq: Two times a day (BID) | ORAL | Status: DC

## 2013-11-26 NOTE — Patient Instructions (Addendum)
We have sent  medications to your pharmacy for you to pick up at your convenience. We have given you information on rectal care. Follow up with Dr Fuller Plan on 12/24/13 9 am. Please use Lotrimin cream over the counter twice daily to the perianal area for 10 days.  CC:  Unice Cobble MD

## 2013-11-26 NOTE — Progress Notes (Signed)
    History of Present Illness: This is a 53 year old female with multiple GI complaints. She has or urgent watery diarrhea associated with lower abdominal cramping following meals. She relates intermittent small-volume bright red rectal bleeding, intermittent anal and rectal pain and prerianal irritation for several months. She is using steroid cream prescribed by her gynecologist with some improvement in symptoms. She underwent colonoscopy in January 2013 showing only internal hemorrhoids.  Review of Systems: Pertinent positive and negative review of systems were noted in the above HPI section. All other review of systems were otherwise negative.  Current Medications, Allergies, Past Medical History, Past Surgical History, Family History and Social History were reviewed in [employer] record.  Physical Exam: General: Well developed , well nourished, no acute distress Head: Normocephalic and atraumatic Eyes:  sclerae anicteric, EOMI Ears: Normal auditory acuity Mouth: No deformity or lesions Neck: Supple, no masses or thyromegaly Lungs: Clear throughout to auscultation Heart: Regular rate and rhythm; no murmurs, rubs or bruits Abdomen: Soft, non tender and non distended. No masses, hepatosplenomegaly or hernias noted. Normal Bowel sounds Rectal: Slight perianal skin pigmentation which could possibly be Candida. No internal lesions, Hemoccult negative soft stool the vault no anal canal tenderness  Musculoskeletal: Symmetrical with no gross deformities  Skin: No lesions on visible extremities Pulses:  Normal pulses noted Extremities: No clubbing, cyanosis, edema or deformities noted Neurological: Alert oriented x 4, grossly nonfocal Cervical Nodes:  No significant cervical adenopathy Inguinal Nodes: No significant inguinal adenopathy Psychological:  Alert and cooperative. Normal mood and affect  Assessment and Recommendations:  1. IBS-D.  FODMAP diet. Begin  glycopyrrolate 1 mg twice a day. Imodium twice daily as needed.  2. Symptomatic internal hemorrhoids. Standard rectal care instructions and Anusol-HC suppositories twice a day as needed.   3. Possible perianal dermatitis. Lotrimin cream twice a day for 10 days.

## 2013-12-15 ENCOUNTER — Encounter: Payer: Self-pay | Admitting: Gastroenterology

## 2013-12-22 ENCOUNTER — Other Ambulatory Visit: Payer: Self-pay

## 2013-12-22 MED ORDER — HYDROCHLOROTHIAZIDE 12.5 MG PO CAPS: ORAL_CAPSULE | ORAL | Status: DC

## 2013-12-24 ENCOUNTER — Ambulatory Visit: Payer: 59 | Admitting: Gastroenterology

## 2014-03-19 ENCOUNTER — Other Ambulatory Visit (INDEPENDENT_AMBULATORY_CARE_PROVIDER_SITE_OTHER): Payer: 59

## 2014-03-19 ENCOUNTER — Encounter: Payer: Self-pay | Admitting: Internal Medicine

## 2014-03-19 ENCOUNTER — Ambulatory Visit (INDEPENDENT_AMBULATORY_CARE_PROVIDER_SITE_OTHER): Payer: 59 | Admitting: Internal Medicine

## 2014-03-19 VITALS — BP 126/70 | HR 64 | Temp 97.7°F | Resp 15 | Ht 65.0 in | Wt 178.0 lb

## 2014-03-19 DIAGNOSIS — G5602 Carpal tunnel syndrome, left upper limb: Secondary | ICD-10-CM

## 2014-03-19 DIAGNOSIS — G5601 Carpal tunnel syndrome, right upper limb: Secondary | ICD-10-CM

## 2014-03-19 DIAGNOSIS — Z0189 Encounter for other specified special examinations: Secondary | ICD-10-CM

## 2014-03-19 DIAGNOSIS — Z0000 Encounter for general adult medical examination without abnormal findings: Secondary | ICD-10-CM

## 2014-03-19 DIAGNOSIS — G5603 Carpal tunnel syndrome, bilateral upper limbs: Secondary | ICD-10-CM

## 2014-03-19 LAB — BASIC METABOLIC PANEL
BUN: 18 mg/dL (ref 6–23)
CALCIUM: 9.3 mg/dL (ref 8.4–10.5)
CO2: 29 meq/L (ref 19–32)
Chloride: 104 mEq/L (ref 96–112)
Creatinine, Ser: 1.03 mg/dL (ref 0.40–1.20)
GFR: 59.2 mL/min — ABNORMAL LOW (ref >60.00)
Glucose, Bld: 85 mg/dL (ref 70–99)
Potassium: 4.5 mEq/L (ref 3.5–5.1)
SODIUM: 140 meq/L (ref 135–145)

## 2014-03-19 LAB — LIPID PANEL
Cholesterol: 193 mg/dL (ref 0–200)
HDL: 59 mg/dL (ref >39.00)
LDL CALC: 117 mg/dL — AB (ref 0–99)
NonHDL: 134
Total CHOL/HDL Ratio: 3
Triglycerides: 86 mg/dL (ref 0.0–149.0)
VLDL: 17.2 mg/dL (ref 0.0–40.0)

## 2014-03-19 LAB — CBC WITH DIFFERENTIAL/PLATELET
BASOS PCT: 0.7 % (ref 0.0–3.0)
Basophils Absolute: 0.1 10*3/uL (ref 0.0–0.1)
Eosinophils Absolute: 0.2 10*3/uL (ref 0.0–0.7)
Eosinophils Relative: 3.1 % (ref 0.0–5.0)
HEMATOCRIT: 41.4 % (ref 36.0–46.0)
HEMOGLOBIN: 13.9 g/dL (ref 12.0–15.0)
Lymphocytes Relative: 46 % (ref 12.0–46.0)
Lymphs Abs: 3.3 10*3/uL (ref 0.7–4.0)
MCHC: 33.6 g/dL (ref 30.0–36.0)
MCV: 83.2 fl (ref 78.0–100.0)
MONOS PCT: 6.2 % (ref 3.0–12.0)
Monocytes Absolute: 0.4 10*3/uL (ref 0.1–1.0)
NEUTROS PCT: 44 % (ref 43.0–77.0)
Neutro Abs: 3.1 10*3/uL (ref 1.4–7.7)
PLATELETS: 239 10*3/uL (ref 150.0–400.0)
RBC: 4.97 Mil/uL (ref 3.87–5.11)
RDW: 15.2 % (ref 11.5–15.5)
WBC: 7.1 10*3/uL (ref 4.0–10.5)

## 2014-03-19 LAB — HEPATIC FUNCTION PANEL
ALK PHOS: 58 U/L (ref 39–117)
ALT: 27 U/L (ref 0–35)
AST: 24 U/L (ref 0–37)
Albumin: 4 g/dL (ref 3.5–5.2)
Bilirubin, Direct: 0.1 mg/dL (ref 0.0–0.3)
Total Bilirubin: 0.4 mg/dL (ref 0.2–1.2)
Total Protein: 6.8 g/dL (ref 6.0–8.3)

## 2014-03-19 LAB — TSH: TSH: 2.49 u[IU]/mL (ref 0.35–4.50)

## 2014-03-19 NOTE — Patient Instructions (Addendum)
Your next office appointment will be determined based upon review of your pending labs . Those instructions will be transmitted to you through My Chart  Critical values will be called Followup as needed for your acute issue. Please report any significant change in your symptoms. Minimal Blood Pressure Goal= AVERAGE < 140/90;  Ideal is an AVERAGE < 135/85. This AVERAGE should be calculated from @ least 5-7 BP readings taken @ different times of day on different days of week. You should not respond to isolated BP readings , but rather the AVERAGE for that week .Please bring your  blood pressure cuff to office visits to verify that it is reliable.It  can also be checked against the blood pressure device at the pharmacy. Finger or wrist cuffs are not dependable; an arm cuff is. Go to [facility] for information on Carpal Tunnel Syndrome. If symptoms persist or progress despite wrist splints; nerve conduction/EMG studies would be indicated. If these were abnormal, Hand Surgery referral would be indicated. Take the EKG to any emergency room or preop visits. There are nonspecific changes; as long as there is no new change these are not clinically significant . If the old EKG is not available for comparison; it may result in unnecessary hospitalization for observation with significant unnecessary expense.

## 2014-03-19 NOTE — Progress Notes (Signed)
Pre visit review using our clinic review tool, if applicable. No additional management support is needed unless otherwise documented below in the visit note. 

## 2014-03-19 NOTE — Progress Notes (Signed)
Subjective:    Patient ID: Whitney Blake, female    DOB: [birth year], 54 y.o.   MRN: [id number]  HPI  She is here for a physical;acute issues delineated below  She has been compliant with her diuretic without adverse effect. Blood pressure ranges 126/70-145/90. She is on a modified heart healthy diet; she eats red meat occasionally. She does not add salt. She's involved in a [employer] twice a week for 45 minutes without cardiopulmonary symptoms.  Her gynecologist has recommended hormone replacement therapy for hot flashes. She's concerned as her mother had breast and uterine cancer. The latter was in the context of tamoxifen therapy.  She also has a past history of migraines which have essentially resolved with menopause onset.  Review of Systems  Chest pain, palpitations, tachycardia, exertional dyspnea, paroxysmal nocturnal dyspnea, claudication or edema are absent.   She has a history of cervical stenosis; she thought this was causing numbness and tingling in her upper extremities from elbows down. This is in the context of prolonged hours on the computer over the past year. Symptoms are worse at night and awaken her. They appear to be mainly positional when she is supine.       Objective:   Physical Exam Gen.: Adequately nourished in appearance. Alert, appropriate and cooperative throughout exam. Appears younger than stated age  Head: Normocephalic without obvious abnormalities  Eyes: No corneal or conjunctival inflammation noted. Pupils equal round reactive to light and accommodation. Extraocular motion intact.  Ears: External  ear exam reveals no significant lesions or deformities. Canals clear .TMs normal. Hearing is grossly normal bilaterally. Nose: External nasal exam reveals no deformity or inflammation. Nasal mucosa are pink and moist. No lesions or exudates noted.   Mouth: Oral mucosa and oropharynx reveal no lesions or exudates. Teeth in good repair. Neck: No deformities,  masses, or tenderness noted. Range of motion slightly decreased. Thyroid normal. Lungs: Normal respiratory effort; chest expands symmetrically. Lungs are clear to auscultation without rales, wheezes, or increased work of breathing. Heart: Normal rate and rhythm. Normal S1 and S2. No gallop, click, or rub. No murmur. Abdomen: Bowel sounds normal; abdomen soft and nontender. No masses, organomegaly or hernias noted. Genitalia: as per Gyn                                  Musculoskeletal/extremities: No deformity or scoliosis noted of  the thoracic or lumbar spine.  No clubbing, cyanosis, edema, or significant extremity  deformity noted.  Range of motion normal . Tone & strength normal. Hand joints normal  Fingernail  health good. Minimal crepitus of knees  Able to lie down & sit up w/o help.  Negative SLR bilaterally Vascular: Carotid, radial artery, dorsalis pedis and  posterior tibial pulses are full and equal. No bruits present. Neurologic: Alert and oriented x3. Deep tendon reflexes symmetrical and normal. Equivocal Tinel's bilaterally Gait normal      Skin: Intact without suspicious lesions or rashes. Lymph: No cervical, axillary lymphadenopathy present. Psych: Mood and affect are normal. Normally interactive  Assessment & Plan:  #1 comprehensive physical exam; no acute findings #2 CTS #3 NS ST-T EKG changes, stable Plan: see Orders  & Recommendations

## 2014-03-20 ENCOUNTER — Encounter: Payer: Self-pay | Admitting: Internal Medicine

## 2014-04-17 ENCOUNTER — Other Ambulatory Visit: Payer: Self-pay | Admitting: Internal Medicine

## 2014-04-17 NOTE — Telephone Encounter (Signed)
OK X 3 mo

## 2014-04-17 NOTE — Telephone Encounter (Signed)
Sent #90 to pharmacy...Whitney Blake

## 2014-05-03 ENCOUNTER — Emergency Department (HOSPITAL_COMMUNITY)
Admission: EM | Admit: 2014-05-03 | Discharge: 2014-05-03 | Disposition: A | Discharge status: Home / Self Care | Payer: 59 | Source: Home / Self Care

## 2014-05-03 ENCOUNTER — Encounter (HOSPITAL_COMMUNITY): Payer: Self-pay | Admitting: Emergency Medicine

## 2014-05-03 DIAGNOSIS — S39012A Strain of muscle, fascia and tendon of lower back, initial encounter: Secondary | ICD-10-CM

## 2014-05-03 LAB — POCT URINALYSIS DIP (DEVICE)
Bilirubin Urine: NEGATIVE
Glucose, UA: NEGATIVE mg/dL
Hgb urine dipstick: NEGATIVE
Nitrite: NEGATIVE
PH: 7 (ref 5.0–8.0)
Protein, ur: NEGATIVE mg/dL
SPECIFIC GRAVITY, URINE: 1.025 (ref 1.005–1.030)
UROBILINOGEN UA: 0.2 mg/dL (ref 0.0–1.0)

## 2014-05-03 MED ORDER — CYCLOBENZAPRINE HCL 5 MG PO TABS: 5.0000 mg | ORAL_TABLET | Freq: Three times a day (TID) | ORAL | Status: DC | PRN

## 2014-05-03 MED ORDER — TRAMADOL HCL 50 MG PO TABS: 50.0000 mg | ORAL_TABLET | Freq: Two times a day (BID) | ORAL | Status: DC | PRN

## 2014-05-03 NOTE — ED Provider Notes (Signed)
CSN: [id number]     Arrival date & time 05/03/14  1339 History   None    Chief Complaint  Patient presents with  . 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 Has developed right lower back pain over the past 2 weeks. Limited response to ice packs and Ibuprofen at home. Denies bowel or bladder issues. Reports remote history of back surgery (lumbar laminectomy) in 2003 (Dr. Arnoldo Morale).   Patient is a 54 y.o. female presenting with back pain. The history is provided by the patient.  Back Pain Location:  Lumbar spine (on right) Quality:  Aching   Past Medical History  Diagnosis Date  . Nonspecific ST-T changes 2007  . Normal nuclear stress test 2007  . Cervical spinal stenosis     Dr Arnoldo Morale, [place]  . Migraines     menstrual  . Anemia     PMH of  . Hypertension   . Internal hemorrhoids    Past Surgical History  Procedure Laterality Date  . Tonsillectomy and adenoidectomy    . Orif clavicular fracture      bilaterally @ age 3  . Concussion      DUE TO FALL AT AGE 2  . Lumbar laminectomy  2003    FOR RUPTURED DISC  . Foot surgery      Right   . Colonoscopy  2012    neg; [facility]   Family History  Problem Relation Age of Onset  . Asthma Father   . Hypertension Father   . Colon polyps Father   . Heart disease Father     S/P ablation for AF  . Stroke Father     cns bleed on coumadin  . Hypertension Mother     bilateral endarterectomies,pacer,mesenteric artery bypass, renal artery stenosis stented unilaterally, intestinal perforation, steroids for colitis induced osteoprosis, polyps   . Cancer Mother     uterine S/P Tamoxifen  . Transient ischemic attack Mother     in early 74's  . Colon polyps Mother   .  Irritable bowel syndrome Mother   . Breast cancer Mother   . Heart disease Mother     pacer  . Transient ischemic attack Mother   . Hypertension Sister     2 sisters  . Cancer Maternal Grandmother     uterine  . Diabetes Maternal Grandmother   . Cancer Sister     cervical  . Colon cancer Neg Hx    History  Substance Use Topics  . Smoking status: Never Smoker   . Smokeless tobacco: Never Used  . Alcohol Use: No   OB History    Gravida Para Term Preterm AB TAB SAB Ectopic Multiple Living   2 2              Obstetric Comments   C-Section x1     Review of Systems  Musculoskeletal: Positive for back pain.  All other systems reviewed and are negative.   Allergies  Rofecoxib and Coreg  Home Medications   Prior to Admission medications   Medication Sig Start Date End Date Taking? Authorizing Provider  aspirin 81 MG tablet Take 81 mg by mouth daily.   Yes Historical Provider, MD  gabapentin (NEURONTIN) 300 MG capsule TAKE 1 CAPSULE BY MOUTH  AT BEDTIME AS NEEDED 04/17/14  Yes Hendricks Limes, MD  hydrochlorothiazide (MICROZIDE) 12.5 MG capsule TAKE 1 CAPSULE BY MOUTH ONCE DAILY 12/22/13  Yes Hendricks Limes, MD  cyclobenzaprine (FLEXERIL) 5 MG tablet Take 1 tablet (5 mg total) by mouth 3 (three) times daily as needed for muscle spasms. 05/03/14   Audelia Hives Amando Chaput, PA  traMADol (ULTRAM) 50 MG tablet Take 1 tablet (50 mg total) by mouth every 12 (twelve) hours as needed for moderate pain. 05/03/14   Audelia Hives Sayid Moll, PA   BP 169/80 mmHg  Pulse 57  Temp(Src) 97.9 F (36.6 C) (Oral)  SpO2 100%  LMP 02/14/2012 Physical Exam  Constitutional: She is oriented to person, place, and time. She appears well-developed and well-nourished. No distress.  HENT:  Head: Normocephalic and atraumatic.  Eyes: Conjunctivae are normal. No scleral icterus.  Cardiovascular: Normal rate, regular rhythm and normal heart sounds.   Pulmonary/Chest: Effort normal and breath sounds normal.    Musculoskeletal: Normal range of motion.       Lumbar back: She exhibits no bony tenderness.       Back:  Outlined area is region of discomfort CSM exam of both lower extremities is normal.   Neurological: She is alert and oriented to person, place, and time.  Skin: Skin is warm and dry.  Psychiatric: She has a normal mood and affect. Her behavior is normal.  Nursing note and vitals reviewed.   ED Course  Procedures (including critical care time) Labs Review Labs Reviewed  POCT URINALYSIS DIP (DEVICE) - Abnormal; Notable for the following:    Ketones, ur TRACE (*)    Leukocytes, UA TRACE (*)    All other components within normal limits  URINE CULTURE    Imaging Review No results found.   MDM   1. Lumbar strain, initial encounter   Hx and exam suggested of lumbar strain with component of muscle spasm. No evidence of neuromuscular deficit or cauda equina syndrome. Will provide info for stretching exercises at home along with Rxs for tramadol and flexeril and advise follow up with PCP or neurosurgery if symptoms persist or worsen.    Lutricia Feil, [place] 05/03/14 [id number]

## 2014-05-03 NOTE — ED Notes (Signed)
Low back pain off and on 1-2 weeks

## 2014-05-03 NOTE — Discharge Instructions (Signed)
Your urine studies were normal.   Back Exercises Back exercises help treat and prevent back injuries. The goal of back exercises is to increase the strength of your abdominal and back muscles and the flexibility of your back. These exercises should be started when you no longer have back pain. Back exercises include:  Pelvic Tilt. Lie on your back with your knees bent. Tilt your pelvis until the lower part of your back is against the floor. Hold this position 5 to 10 sec and repeat 5 to 10 times.  Knee to Chest. Pull first 1 knee up against your chest and hold for 20 to 30 seconds, repeat this with the other knee, and then both knees. This may be done with the other leg straight or bent, whichever feels better.  Sit-Ups or Curl-Ups. Bend your knees 90 degrees. Start with tilting your pelvis, and do a partial, slow sit-up, lifting your trunk only 30 to 45 degrees off the floor. Take at least 2 to 3 seconds for each sit-up. Do not do sit-ups with your knees out straight. If partial sit-ups are difficult, simply do the above but with only tightening your abdominal muscles and holding it as directed.  Hip-Lift. Lie on your back with your knees flexed 90 degrees. Push down with your feet and shoulders as you raise your hips a couple inches off the floor; hold for 10 seconds, repeat 5 to 10 times.  Back arches. Lie on your stomach, propping yourself up on bent elbows. Slowly press on your hands, causing an arch in your low back. Repeat 3 to 5 times. Any initial stiffness and discomfort should lessen with repetition over time.  Shoulder-Lifts. Lie face down with arms beside your body. Keep hips and torso pressed to floor as you slowly lift your head and shoulders off the floor. Do not overdo your exercises, especially in the beginning. Exercises may cause you some mild back discomfort which lasts for a few minutes; however, if the pain is more severe, or lasts for more than 15 minutes, do not continue  exercises until you see your caregiver. Improvement with exercise therapy for back problems is slow.  See your caregivers for assistance with developing a proper back exercise program. Document Released: 03/09/2004 Document Revised: 04/24/2011 Document Reviewed: 12/01/2010 [facility] Patient Information 2015 [place], [place]. This information is not intended to replace advice given to you by your health care provider. Make sure you discuss any questions you have with your health care provider.  Back Pain, Adult Low back pain is very common. About 1 in 5 people have back pain.The cause of low back pain is rarely dangerous. The pain often gets better over time.About half of people with a sudden onset of back pain feel better in just 2 weeks. About 8 in 10 people feel better by 6 weeks.  CAUSES Some common causes of back pain include:  Strain of the muscles or ligaments supporting the spine.  Wear and tear (degeneration) of the spinal discs.  Arthritis.  Direct injury to the back. DIAGNOSIS Most of the time, the direct cause of low back pain is not known.However, back pain can be treated effectively even when the exact cause of the pain is unknown.Answering your caregiver's questions about your overall health and symptoms is one of the most accurate ways to make sure the cause of your pain is not dangerous. If your caregiver needs more information, he or she may order lab work or imaging tests (X-rays or MRIs).However, even  if imaging tests show changes in your back, this usually does not require surgery. HOME CARE INSTRUCTIONS For many people, back pain returns.Since low back pain is rarely dangerous, it is often a condition that people can learn to [facility] their own.   Remain active. It is stressful on the back to sit or stand in one place. Do not sit, drive, or stand in one place for more than 30 minutes at a time. Take short walks on level surfaces as soon as pain allows.Try to increase  the length of time you walk each day.  Do not stay in bed.Resting more than 1 or 2 days can delay your recovery.  Do not avoid exercise or work.Your body is made to move.It is not dangerous to be active, even though your back may hurt.Your back will likely heal faster if you return to being active before your pain is gone.  Pay attention to your body when you bend and lift. Many people have less discomfortwhen lifting if they bend their knees, keep the load close to their bodies,and avoid twisting. Often, the most comfortable positions are those that put less stress on your recovering back.  Find a comfortable position to sleep. Use a firm mattress and lie on your side with your knees slightly bent. If you lie on your back, put a pillow under your knees.  Only take over-the-counter or prescription medicines as directed by your caregiver. Over-the-counter medicines to reduce pain and inflammation are often the most helpful.Your caregiver may prescribe muscle relaxant drugs.These medicines help dull your pain so you can more quickly return to your normal activities and healthy exercise.  Put ice on the injured area.  Put ice in a plastic bag.  Place a towel between your skin and the bag.  Leave the ice on for 15-20 minutes, 03-04 times a day for the first 2 to 3 days. After that, ice and heat may be alternated to reduce pain and spasms.  Ask your caregiver about trying back exercises and gentle massage. This may be of some benefit.  Avoid feeling anxious or stressed.Stress increases muscle tension and can worsen back pain.It is important to recognize when you are anxious or stressed and learn ways to manage it.Exercise is a great option. SEEK MEDICAL CARE IF:  You have pain that is not relieved with rest or medicine.  You have pain that does not improve in 1 week.  You have new symptoms.  You are generally not feeling well. SEEK IMMEDIATE MEDICAL CARE IF:   You have pain  that radiates from your back into your legs.  You develop new bowel or bladder control problems.  You have unusual weakness or numbness in your arms or legs.  You develop nausea or vomiting.  You develop abdominal pain.  You feel faint. Document Released: 01/30/2005 Document Revised: 08/01/2011 Document Reviewed: 06/03/2013 [facility] Patient Information 2015 [place], [place]. This information is not intended to replace advice given to you by your health care provider. Make sure you discuss any questions you have with your health care provider.  Lumbosacral Strain Lumbosacral strain is a strain of any of the parts that make up your lumbosacral vertebrae. Your lumbosacral vertebrae are the bones that make up the lower third of your backbone. Your lumbosacral vertebrae are held together by muscles and tough, fibrous tissue (ligaments).  CAUSES  A sudden blow to your back can cause lumbosacral strain. Also, anything that causes an excessive stretch of the muscles in the low back  can cause this strain. This is typically seen when people exert themselves strenuously, fall, lift heavy objects, bend, or crouch repeatedly. RISK FACTORS  Physically demanding work.  Participation in pushing or pulling sports or sports that require a sudden twist of the back (tennis, golf, baseball).  Weight lifting.  Excessive lower back curvature.  Forward-tilted pelvis.  Weak back or abdominal muscles or both.  Tight hamstrings. SIGNS AND SYMPTOMS  Lumbosacral strain may cause pain in the area of your injury or pain that moves (radiates) down your leg.  DIAGNOSIS Your health care provider can often diagnose lumbosacral strain through a physical exam. In some cases, you may need tests such as X-ray exams.  TREATMENT  Treatment for your lower back injury depends on many factors that your clinician will have to evaluate. However, most treatment will include the use of anti-inflammatory medicines. HOME CARE  INSTRUCTIONS   Avoid hard physical activities (tennis, racquetball, waterskiing) if you are not in proper physical condition for it. This may aggravate or create problems.  If you have a back problem, avoid sports requiring sudden body movements. Swimming and walking are generally safer activities.  Maintain good posture.  Maintain a healthy weight.  For acute conditions, you may put ice on the injured area.  Put ice in a plastic bag.  Place a towel between your skin and the bag.  Leave the ice on for 20 minutes, 2-3 times a day.  When the low back starts healing, stretching and strengthening exercises may be recommended. SEEK MEDICAL CARE IF:  Your back pain is getting worse.  You experience severe back pain not relieved with medicines. SEEK IMMEDIATE MEDICAL CARE IF:   You have numbness, tingling, weakness, or problems with the use of your arms or legs.  There is a change in bowel or bladder control.  You have increasing pain in any area of the body, including your belly (abdomen).  You notice shortness of breath, dizziness, or feel faint.  You feel sick to your stomach (nauseous), are throwing up (vomiting), or become sweaty.  You notice discoloration of your toes or legs, or your feet get very cold. MAKE SURE YOU:   Understand these instructions.  Will watch your condition.  Will get help right away if you are not doing well or get worse. Document Released: 11/09/2004 Document Revised: 02/04/2013 Document Reviewed: 09/18/2012 [facility] Patient Information 2015 [place], [place]. This information is not intended to replace advice given to you by your health care provider. Make sure you discuss any questions you have with your health care provider.

## 2014-05-04 LAB — URINE CULTURE
COLONY COUNT: NO GROWTH
CULTURE: NO GROWTH

## 2014-06-11 ENCOUNTER — Encounter: Payer: Self-pay | Admitting: Internal Medicine

## 2014-06-11 ENCOUNTER — Ambulatory Visit (INDEPENDENT_AMBULATORY_CARE_PROVIDER_SITE_OTHER): Payer: 59 | Admitting: Internal Medicine

## 2014-06-11 VITALS — BP 135/95 | HR 62 | Temp 97.9°F | Ht 65.0 in | Wt 175.0 lb

## 2014-06-11 DIAGNOSIS — I10 Essential (primary) hypertension: Secondary | ICD-10-CM | POA: Diagnosis not present

## 2014-06-11 DIAGNOSIS — J309 Allergic rhinitis, unspecified: Secondary | ICD-10-CM | POA: Diagnosis not present

## 2014-06-11 DIAGNOSIS — J069 Acute upper respiratory infection, unspecified: Secondary | ICD-10-CM | POA: Diagnosis not present

## 2014-06-11 DIAGNOSIS — R05 Cough: Secondary | ICD-10-CM | POA: Diagnosis not present

## 2014-06-11 DIAGNOSIS — R059 Cough, unspecified: Secondary | ICD-10-CM

## 2014-06-11 MED ORDER — AZITHROMYCIN 250 MG PO TABS: ORAL_TABLET | ORAL | Status: DC

## 2014-06-11 MED ORDER — LOSARTAN POTASSIUM-HCTZ 50-12.5 MG PO TABS: 1.0000 | ORAL_TABLET | Freq: Every day | ORAL | Status: DC

## 2014-06-11 MED ORDER — HYDROCODONE-HOMATROPINE 5-1.5 MG/5ML PO SYRP: 5.0000 mL | ORAL_SOLUTION | Freq: Four times a day (QID) | ORAL | Status: DC | PRN

## 2014-06-11 NOTE — Progress Notes (Signed)
Pre visit review using our clinic review tool, if applicable. No additional management support is needed unless otherwise documented below in the visit note. 

## 2014-06-11 NOTE — Progress Notes (Signed)
   Subjective:    Patient ID: Whitney Blake, female    DOB: December 01, 1960, 54 y.o.   MRN: [id number]  HPI  She has been ill for approximately 10 days; initial symptoms were malaise for 3 days. As of 4/22 she developed a severe sore throat. She was actually bedridden 4/22-4/24/16. Symptom complex progressed to include rhinitis with clear secretions; chest congestion with clear sputum; itchy watery eyes and slight sneezing; dental pain; and some wheezing.  She had been exposed to her daughter who had documented strep throat. Her husband also was ill with respiratory tract symptoms.  She has been taking NyQuil, DayQuil, Tylenol.  Blood pressure at home averages 140/95. She is not monitoring on a regular basis     Review of Systems  She denies frontal sinus pain, nasal purulence, fever or chills. She has sweats related to hot flashes.  She has no active cardiovascular symptoms        Objective:   Physical Exam  General appearance:Adequately nourished; no acute distress or increased work of breathing is present.    Lymphatic: No  lymphadenopathy about the head, neck, or axilla .  Eyes: No conjunctival inflammation or lid edema is present. There is no scleral icterus.  Ears:  External ear exam shows no significant lesions or deformities.  Otoscopic examination reveals clear canals, tympanic membranes are intact bilaterally without bulging, retraction, inflammation or discharge.  Nose:  External nasal examination shows no deformity or inflammation. Nasal mucosa are pink and moist without lesions or exudates No septal dislocation or deviation.No obstruction to airflow.   Oral exam: Dental hygiene is good; lips and gums are healthy appearing.There is no oropharyngeal erythema or exudate .  Neck:  No deformities, thyromegaly, masses, or tenderness noted.   Supple with full range of motion without pain.   Heart:  Normal rate and regular rhythm. S1 and S2 normal without gallop, murmur,  click, rub or other extra sounds.   Lungs:Chest clear to auscultation; no wheezes, rhonchi,rales ,or rubs present.  Extremities:  No cyanosis, edema, or clubbing  noted    Skin: Warm & dry w/o tenting or jaundice. No significant lesions or rash.        Assessment & Plan:  #1 URI #2 rhinitis #3 cough #4 HTN See orders

## 2014-06-11 NOTE — Patient Instructions (Signed)
Plain Mucinex (NOT D) for thick secretions ;force NON dairy fluids .   Nasal cleansing in the shower as discussed with lather of mild shampoo.After 10 seconds wash off lather while  exhaling through nostrils. Make sure that all residual soap is removed to prevent irritation.  Flonase OR Nasacort AQ 1 spray in each nostril twice a day as needed. Use the "crossover" technique into opposite nostril spraying toward opposite ear @ 45 degree angle, not straight up into nostril.  Plain Allegra (NOT D )  160 daily , Loratidine 10 mg , OR Zyrtec 10 mg @ bedtime  as needed for itchy eyes & sneezing.  Minimal Blood Pressure Goal= AVERAGE < 140/90;  Ideal is an AVERAGE < 135/85. This AVERAGE should be calculated from @ least 5-7 BP readings taken @ different times of day on different days of week. You should not respond to isolated BP readings , but rather the AVERAGE for that week .Please bring your  blood pressure cuff to office visits to verify that it is reliable.It  can also be checked against the blood pressure device at the pharmacy. Finger or wrist cuffs are not dependable; an arm cuff is.

## 2014-06-16 ENCOUNTER — Other Ambulatory Visit: Payer: Self-pay

## 2014-06-16 DIAGNOSIS — Z1231 Encounter for screening mammogram for malignant neoplasm of breast: Secondary | ICD-10-CM

## 2014-07-03 ENCOUNTER — Ambulatory Visit
Admission: RE | Admit: 2014-07-03 | Discharge: 2014-07-03 | Disposition: A | Discharge status: Home / Self Care | Payer: 59 | Source: Ambulatory Visit

## 2014-07-03 DIAGNOSIS — Z1231 Encounter for screening mammogram for malignant neoplasm of breast: Secondary | ICD-10-CM

## 2014-07-11 ENCOUNTER — Other Ambulatory Visit: Payer: Self-pay | Admitting: Internal Medicine

## 2014-07-14 ENCOUNTER — Other Ambulatory Visit: Payer: Self-pay | Admitting: Internal Medicine

## 2014-07-14 NOTE — Telephone Encounter (Signed)
Gabapentin rx sent to pharm

## 2014-11-25 ENCOUNTER — Ambulatory Visit (INDEPENDENT_AMBULATORY_CARE_PROVIDER_SITE_OTHER): Payer: 59 | Admitting: Internal Medicine

## 2014-11-25 ENCOUNTER — Encounter: Payer: Self-pay | Admitting: Internal Medicine

## 2014-11-25 VITALS — BP 124/78 | HR 97 | Temp 97.8°F | Ht 65.0 in | Wt 176.5 lb

## 2014-11-25 DIAGNOSIS — D690 Allergic purpura: Secondary | ICD-10-CM | POA: Diagnosis not present

## 2014-11-25 MED ORDER — PREDNISONE 10 MG PO TABS: ORAL_TABLET | ORAL | Status: DC

## 2014-11-25 MED ORDER — HYDROXYZINE HCL 10 MG PO TABS: 10.0000 mg | ORAL_TABLET | Freq: Three times a day (TID) | ORAL | Status: DC | PRN

## 2014-11-25 MED ORDER — MOMETASONE FUROATE 0.1 % EX OINT: TOPICAL_OINTMENT | Freq: Two times a day (BID) | CUTANEOUS | Status: DC

## 2014-11-25 NOTE — Progress Notes (Signed)
   Subjective:    Patient ID: Whitney Blake, female    DOB: [birth year], 54 y.o.   MRN: [id number]  HPI She began to have itching in the left flank/hip area 1.5 weeks ago. She noted some red papules which have progressed laterally. There was no trigger or exposure to ticks or agents such as poison ivy.  She's been using topical poison ivy lotion as well as topical steroids without benefit  She has no other symptoms except for some frontal headache.   Review of Systems  No associated itchy, watery eyes.  Swelling of the lips or tongue denied.  Shortness of breath, wheezing, or cough absent.  No vesicles, pustules or urticaria noted.  Fever or sweats denied. Purulence absent.  Diarrhea not present.     Objective:   Physical Exam Pertinent or positive findings include: She has 2 areas of macular rash over the left flank which blanch almost totally with pressure. The most medial measures 7.5 x 3 cm and the lateral is 4.5 cm x 7 mm. Mild dermatographia present.  General appearance :adequately nourished; in no distress.  Eyes: No conjunctival inflammation or scleral icterus is present.  Oral exam:  Lips and gums are healthy appearing.There is no oropharyngeal erythema or exudate noted. Dental hygiene is good.  Heart:  Normal rate and regular rhythm. S1 and S2 normal without gallop, murmur, click, rub or other extra sounds    Lungs:Chest clear to auscultation; no wheezes, rhonchi,rales ,or rubs present.No increased work of breathing.   Abdomen: bowel sounds normal, soft and non-tender without masses, organomegaly or hernias noted.  No guarding or rebound.   Vascular : all pulses equal ; no bruits present.  Skin:Warm & dry ; no tenting or jaundice   Lymphatic: No lymphadenopathy is noted about the head, neck, axilla.   Neuro: Strength, tone normal     Assessment & Plan:  #1 vascular type dermatitis  Plan: See orders and recommendations

## 2014-11-25 NOTE — Progress Notes (Signed)
Pre visit review using our clinic review tool, if applicable. No additional management support is needed unless otherwise documented below in the visit note. 

## 2014-11-25 NOTE — Patient Instructions (Signed)
Please report warning signs as we discussed. Worrisome would be  pain, fever, or pus production.

## 2014-12-08 ENCOUNTER — Other Ambulatory Visit: Payer: Self-pay | Admitting: Internal Medicine

## 2014-12-28 ENCOUNTER — Encounter: Payer: Self-pay | Admitting: Cardiology

## 2015-01-08 ENCOUNTER — Other Ambulatory Visit: Payer: Self-pay | Admitting: Internal Medicine

## 2015-02-17 ENCOUNTER — Encounter: Payer: Self-pay | Admitting: Internal Medicine

## 2015-04-13 ENCOUNTER — Encounter: Payer: Self-pay | Admitting: Internal Medicine

## 2015-04-13 ENCOUNTER — Ambulatory Visit (INDEPENDENT_AMBULATORY_CARE_PROVIDER_SITE_OTHER): Payer: 59 | Admitting: Internal Medicine

## 2015-04-13 ENCOUNTER — Other Ambulatory Visit (INDEPENDENT_AMBULATORY_CARE_PROVIDER_SITE_OTHER): Payer: 59

## 2015-04-13 VITALS — BP 134/86 | HR 70 | Temp 98.1°F | Resp 16 | Wt 176.0 lb

## 2015-04-13 DIAGNOSIS — N951 Menopausal and female climacteric states: Secondary | ICD-10-CM

## 2015-04-13 DIAGNOSIS — Z0000 Encounter for general adult medical examination without abnormal findings: Secondary | ICD-10-CM

## 2015-04-13 DIAGNOSIS — R9431 Abnormal electrocardiogram [ECG] [EKG]: Secondary | ICD-10-CM | POA: Diagnosis not present

## 2015-04-13 DIAGNOSIS — I10 Essential (primary) hypertension: Secondary | ICD-10-CM

## 2015-04-13 DIAGNOSIS — Z8262 Family history of osteoporosis: Secondary | ICD-10-CM | POA: Diagnosis not present

## 2015-04-13 DIAGNOSIS — Z780 Asymptomatic menopausal state: Secondary | ICD-10-CM

## 2015-04-13 LAB — CBC WITH DIFFERENTIAL/PLATELET
BASOS ABS: 0 10*3/uL (ref 0.0–0.1)
BASOS PCT: 0.6 % (ref 0.0–3.0)
EOS ABS: 0.2 10*3/uL (ref 0.0–0.7)
Eosinophils Relative: 2.7 % (ref 0.0–5.0)
HCT: 41.7 % (ref 36.0–46.0)
HEMOGLOBIN: 14 g/dL (ref 12.0–15.0)
Lymphocytes Relative: 35.5 % (ref 12.0–46.0)
Lymphs Abs: 2.3 10*3/uL (ref 0.7–4.0)
MCHC: 33.5 g/dL (ref 30.0–36.0)
MCV: 86.5 fl (ref 78.0–100.0)
Monocytes Absolute: 0.5 10*3/uL (ref 0.1–1.0)
Monocytes Relative: 8 % (ref 3.0–12.0)
NEUTROS ABS: 3.5 10*3/uL (ref 1.4–7.7)
Neutrophils Relative %: 53.2 % (ref 43.0–77.0)
Platelets: 235 10*3/uL (ref 150.0–400.0)
RBC: 4.82 Mil/uL (ref 3.87–5.11)
RDW: 14 % (ref 11.5–15.5)
WBC: 6.6 10*3/uL (ref 4.0–10.5)

## 2015-04-13 LAB — COMPREHENSIVE METABOLIC PANEL
ALBUMIN: 4.1 g/dL (ref 3.5–5.2)
ALK PHOS: 55 U/L (ref 39–117)
ALT: 22 U/L (ref 0–35)
AST: 19 U/L (ref 0–37)
BUN: 12 mg/dL (ref 6–23)
CALCIUM: 9.3 mg/dL (ref 8.4–10.5)
CO2: 33 mEq/L — ABNORMAL HIGH (ref 19–32)
CREATININE: 1 mg/dL (ref 0.40–1.20)
Chloride: 103 mEq/L (ref 96–112)
GFR: 61.02 mL/min (ref >60.00)
Glucose, Bld: 89 mg/dL (ref 70–99)
Potassium: 4.2 mEq/L (ref 3.5–5.1)
Sodium: 142 mEq/L (ref 135–145)
TOTAL PROTEIN: 7.1 g/dL (ref 6.0–8.3)
Total Bilirubin: 0.3 mg/dL (ref 0.2–1.2)

## 2015-04-13 LAB — LIPID PANEL
Cholesterol: 185 mg/dL (ref 0–200)
HDL: 50.8 mg/dL (ref >39.00)
LDL Cholesterol: 103 mg/dL — ABNORMAL HIGH (ref 0–99)
NonHDL: 133.87
TRIGLYCERIDES: 156 mg/dL — AB (ref 0.0–149.0)
Total CHOL/HDL Ratio: 4
VLDL: 31.2 mg/dL (ref 0.0–40.0)

## 2015-04-13 LAB — HEMOGLOBIN A1C: Hgb A1c MFr Bld: 5.7 % (ref 4.6–6.5)

## 2015-04-13 LAB — TSH: TSH: 1.77 u[IU]/mL (ref 0.35–4.50)

## 2015-04-13 LAB — HIV ANTIBODY (ROUTINE TESTING W REFLEX): HIV 1&2 Ab, 4th Generation: NONREACTIVE

## 2015-04-13 LAB — HEPATITIS C ANTIBODY: HCV AB: NEGATIVE

## 2015-04-13 MED ORDER — AMOXICILLIN-POT CLAVULANATE 875-125 MG PO TABS: 1.0000 | ORAL_TABLET | Freq: Two times a day (BID) | ORAL | Status: DC

## 2015-04-13 NOTE — Patient Instructions (Addendum)
We have reviewed your prior records including labs and tests today.  Test(s) ordered today. Your results will be released to Whitney Blake (or called to you) after review, usually within 72hours after test completion. If any changes need to be made, you will be notified at that same time.  All other Health Maintenance issues reviewed.   All recommended immunizations and age-appropriate screenings are up-to-date.  No immunizations administered today.   Medications reviewed and updated.  No changes recommended at this time.  We will start augmentin for your sinus infection.  Your prescription has been submitted to your pharmacy. Please take as directed and contact our office if you believe you are having problem(s) with the medication(s).  A dexa or bone density scan was ordered.   Please followup annually for a physical.   Health Maintenance, Female Adopting a healthy lifestyle and getting preventive care can go a long way to promote health and wellness. Talk with your health care provider about what schedule of regular examinations is right for you. This is a good chance for you to check in with your provider about disease prevention and staying healthy. In between checkups, there are plenty of things you can do on your own. Experts have done a lot of research about which lifestyle changes and preventive measures are most likely to keep you healthy. Ask your health care provider for more information. WEIGHT AND DIET  Eat a healthy diet  Be sure to include plenty of vegetables, fruits, low-fat dairy products, and lean protein.  Do not eat a lot of foods high in solid fats, added sugars, or salt.  Get regular exercise. This is one of the most important things you can do for your health.  Most adults should exercise for at least 150 minutes each week. The exercise should increase your heart rate and make you sweat (moderate-intensity exercise).  Most adults should also do strengthening  exercises at least twice a week. This is in addition to the moderate-intensity exercise.  Maintain a healthy weight  Body mass index (BMI) is a measurement that can be used to identify possible weight problems. It estimates body fat based on height and weight. Your health care provider can help determine your BMI and help you achieve or maintain a healthy weight.  For females 108 years of age and older:   A BMI below 18.5 is considered underweight.  A BMI of 18.5 to 24.9 is normal.  A BMI of 25 to 29.9 is considered overweight.  A BMI of 30 and above is considered obese.  Watch levels of cholesterol and blood lipids  You should start having your blood tested for lipids and cholesterol at 56 years of age, then have this test every 5 years.  You may need to have your cholesterol levels checked more often if:  Your lipid or cholesterol levels are high.  You are older than 56 years of age.  You are at high risk for heart disease.  CANCER SCREENING   Lung Cancer  Lung cancer screening is recommended for adults 67-68 years old who are at high risk for lung cancer because of a history of smoking.  A yearly low-dose CT scan of the lungs is recommended for people who:  Currently smoke.  Have quit within the past 15 years.  Have at least a 30-pack-year history of smoking. A pack year is smoking an average of one pack of cigarettes a day for 1 year.  Yearly screening should continue until it has  been 15 years since you quit.  Yearly screening should stop if you develop a health problem that would prevent you from having lung cancer treatment.  Breast Cancer  Practice breast self-awareness. This means understanding how your breasts normally appear and feel.  It also means doing regular breast self-exams. Let your health care provider know about any changes, no matter how small.  If you are in your 20s or 30s, you should have a clinical breast exam (CBE) by a health care  provider every 1-3 years as part of a regular health exam.  If you are 52 or older, have a CBE every year. Also consider having a breast X-ray (mammogram) every year.  If you have a family history of breast cancer, talk to your health care provider about genetic screening.  If you are at high risk for breast cancer, talk to your health care provider about having an MRI and a mammogram every year.  Breast cancer gene (BRCA) assessment is recommended for women who have family members with BRCA-related cancers. BRCA-related cancers include:  Breast.  Ovarian.  Tubal.  Peritoneal cancers.  Results of the assessment will determine the need for genetic counseling and BRCA1 and BRCA2 testing. Cervical Cancer Your health care provider may recommend that you be screened regularly for cancer of the pelvic organs (ovaries, uterus, and vagina). This screening involves a pelvic examination, including checking for microscopic changes to the surface of your cervix (Pap test). You may be encouraged to have this screening done every 3 years, beginning at age 56.  For women ages 5-65, health care providers may recommend pelvic exams and Pap testing every 3 years, or they may recommend the Pap and pelvic exam, combined with testing for human papilloma virus (HPV), every 5 years. Some types of HPV increase your risk of cervical cancer. Testing for HPV may also be done on women of any age with unclear Pap test results.  Other health care providers may not recommend any screening for nonpregnant women who are considered low risk for pelvic cancer and who do not have symptoms. Ask your health care provider if a screening pelvic exam is right for you.  If you have had past treatment for cervical cancer or a condition that could lead to cancer, you need Pap tests and screening for cancer for at least 20 years after your treatment. If Pap tests have been discontinued, your risk factors (such as having a new sexual  partner) need to be reassessed to determine if screening should resume. Some women have medical problems that increase the chance of getting cervical cancer. In these cases, your health care provider may recommend more frequent screening and Pap tests. Colorectal Cancer  This type of cancer can be detected and often prevented.  Routine colorectal cancer screening usually begins at 56 years of age and continues through 56 years of age.  Your health care provider may recommend screening at an earlier age if you have risk factors for colon cancer.  Your health care provider may also recommend using home test kits to check for hidden blood in the stool.  A small camera at the end of a tube can be used to examine your colon directly (sigmoidoscopy or colonoscopy). This is done to check for the earliest forms of colorectal cancer.  Routine screening usually begins at age 16.  Direct examination of the colon should be repeated every 5-10 years through 56 years of age. However, you may need to be screened more often  if early forms of precancerous polyps or small growths are found. Skin Cancer  Check your skin from head to toe regularly.  Tell your health care provider about any new moles or changes in moles, especially if there is a change in a mole's shape or color.  Also tell your health care provider if you have a mole that is larger than the size of a pencil eraser.  Always use sunscreen. Apply sunscreen liberally and repeatedly throughout the day.  Protect yourself by wearing long sleeves, pants, a wide-brimmed hat, and sunglasses whenever you are outside. HEART DISEASE, DIABETES, AND HIGH BLOOD PRESSURE   High blood pressure causes heart disease and increases the risk of stroke. High blood pressure is more likely to develop in:  People who have blood pressure in the high end of the normal range (130-139/85-89 mm Hg).  People who are overweight or obese.  People who are African  American.  If you are 29-58 years of age, have your blood pressure checked every 3-5 years. If you are 48 years of age or older, have your blood pressure checked every year. You should have your blood pressure measured twice--once when you are at a hospital or clinic, and once when you are not at a hospital or clinic. Record the average of the two measurements. To check your blood pressure when you are not at a hospital or clinic, you can use:  An automated blood pressure machine at a pharmacy.  A home blood pressure monitor.  If you are between 47 years and 70 years old, ask your health care provider if you should take aspirin to prevent strokes.  Have regular diabetes screenings. This involves taking a blood sample to check your fasting blood sugar level.  If you are at a normal weight and have a low risk for diabetes, have this test once every three years after 56 years of age.  If you are overweight and have a high risk for diabetes, consider being tested at a younger age or more often. PREVENTING INFECTION  Hepatitis B  If you have a higher risk for hepatitis B, you should be screened for this virus. You are considered at high risk for hepatitis B if:  You were born in a country where hepatitis B is common. Ask your health care provider which countries are considered high risk.  Your parents were born in a high-risk country, and you have not been immunized against hepatitis B (hepatitis B vaccine).  You have HIV or AIDS.  You use needles to inject street drugs.  You live with someone who has hepatitis B.  You have had sex with someone who has hepatitis B.  You get hemodialysis treatment.  You take certain medicines for conditions, including cancer, organ transplantation, and autoimmune conditions. Hepatitis C  Blood testing is recommended for:  Everyone born from [birth year] through 1966.  Anyone with known risk factors for hepatitis C. Sexually transmitted infections  (STIs)  You should be screened for sexually transmitted infections (STIs) including gonorrhea and chlamydia if:  You are sexually active and are younger than 56 years of age.  You are older than 56 years of age and your health care provider tells you that you are at risk for this type of infection.  Your sexual activity has changed since you were last screened and you are at an increased risk for chlamydia or gonorrhea. Ask your health care provider if you are at risk.  If you do not have HIV,  but are at risk, it may be recommended that you take a prescription medicine daily to prevent HIV infection. This is called pre-exposure prophylaxis (PrEP). You are considered at risk if:  You are sexually active and do not regularly use condoms or know the HIV status of your partner(s).  You take drugs by injection.  You are sexually active with a partner who has HIV. Talk with your health care provider about whether you are at high risk of being infected with HIV. If you choose to begin PrEP, you should first be tested for HIV. You should then be tested every 3 months for as long as you are taking PrEP.  PREGNANCY   If you are premenopausal and you may become pregnant, ask your health care provider about preconception counseling.  If you may become pregnant, take 400 to 800 micrograms (mcg) of folic acid every day.  If you want to prevent pregnancy, talk to your health care provider about birth control (contraception). OSTEOPOROSIS AND MENOPAUSE   Osteoporosis is a disease in which the bones lose minerals and strength with aging. This can result in serious bone fractures. Your risk for osteoporosis can be identified using a bone density scan.  If you are 40 years of age or older, or if you are at risk for osteoporosis and fractures, ask your health care provider if you should be screened.  Ask your health care provider whether you should take a calcium or vitamin D supplement to lower your risk  for osteoporosis.  Menopause may have certain physical symptoms and risks.  Hormone replacement therapy may reduce some of these symptoms and risks. Talk to your health care provider about whether hormone replacement therapy is right for you.  HOME CARE INSTRUCTIONS   Schedule regular health, dental, and eye exams.  Stay current with your immunizations.   Do not use any tobacco products including cigarettes, chewing tobacco, or electronic cigarettes.  If you are pregnant, do not drink alcohol.  If you are breastfeeding, limit how much and how often you drink alcohol.  Limit alcohol intake to no more than 1 drink per day for nonpregnant women. One drink equals 12 ounces of beer, 5 ounces of wine, or 1 ounces of hard liquor.  Do not use street drugs.  Do not share needles.  Ask your health care provider for help if you need support or information about quitting drugs.  Tell your health care provider if you often feel depressed.  Tell your health care provider if you have ever been abused or do not feel safe at home.   This information is not intended to replace advice given to you by your health care provider. Make sure you discuss any questions you have with your health care provider.   Document Released: 08/15/2010 Document Revised: 02/20/2014 Document Reviewed: 01/01/2013 Elsevier Interactive Patient Education [employer].

## 2015-04-13 NOTE — Progress Notes (Signed)
Pre visit review using our clinic review tool, if applicable. No additional management support is needed unless otherwise documented below in the visit note. 

## 2015-04-13 NOTE — Assessment & Plan Note (Signed)
Check EKG today to ensure stability asymptomatic

## 2015-04-13 NOTE — Assessment & Plan Note (Signed)
BP Readings from Last 3 Encounters:  04/13/15 134/86  11/25/14 124/78  06/11/14 135/95   Controlled, but borderline high today Work on weight loss Continue current medication Check cmp

## 2015-04-13 NOTE — Assessment & Plan Note (Signed)
Taking gabapentin nightly - it is helping Continue current dose

## 2015-04-13 NOTE — Progress Notes (Signed)
Subjective:    Patient ID: Whitney Blake, female    DOB: 1960-07-06, 55 y.o.   MRN: [id number]  HPI She is here for a physical exam.   ? Sinus infection:  Her symptoms started last thrusday.  She has teeth pain, sinus pain, nasal congestion, eye watering.  No fever or discolored nasal mucus.    ? Poison ivy:  Had an itchy rash for 2.5 weeks that looks like poison ivy.  He was working in his yard.  She thinks it is getting better.  She is using poison ivy scrub and lotion.  She feels it is getting better.    She is taking her medications as prescribed.  She has no other concerns.    Medications and allergies reviewed with patient and updated if appropriate.  Patient Active Problem List   Diagnosis Date Noted  . Nephrolithiasis 09/06/2012  . Unspecified essential hypertension 12/07/2011  . HOT FLASHES 02/03/2010  . Sleep disorder 05/25/2009  . Migraine 12/21/2008  . MENSTRUAL DISORDER 12/21/2008  . Anemia 09/03/2007  . NONSPECIFIC ABNORMAL ELECTROCARDIOGRAM 09/03/2007  . SPINAL STENOSIS 06/14/2006    Current Outpatient Prescriptions on File Prior to Visit  Medication Sig Dispense Refill  . aspirin 81 MG tablet Take 81 mg by mouth daily.    [name] gabapentin (NEURONTIN) 300 MG capsule TAKE ONE CAPSULE BY MOUTH AT BEDTIME AS NEEDED 90 capsule 0  . losartan-hydrochlorothiazide (HYZAAR) 50-12.5 MG tablet TAKE 1 TABLET BY MOUTH DAILY. 30 tablet 5   No current facility-administered medications on file prior to visit.    Past Medical History  Diagnosis Date  . Nonspecific ST-T changes 2007  . Normal nuclear stress test 2007  . Cervical spinal stenosis     Dr Arnoldo Morale, [place]  . Migraines     menstrual  . Anemia     PMH of  . Hypertension   . Internal hemorrhoids     Past Surgical History  Procedure Laterality Date  . Tonsillectomy and adenoidectomy    . Orif clavicular fracture      bilaterally @ age 27  . Concussion      DUE TO FALL AT AGE 54  . Lumbar laminectomy  2003   FOR RUPTURED DISC  . Foot surgery      Right   . Colonoscopy  2012    neg; [facility]    Social History   Social History  . Marital Status: Married    Spouse Name: N/A  . Number of Children: 2  . Years of Education: N/A   Occupational History  . Computer Programmer [employer]   Social History Main Topics  . Smoking status: Never Smoker   . Smokeless tobacco: Never Used  . Alcohol Use: No  . Drug Use: No  . Sexual Activity: Not Asked   Other Topics Concern  . None   Social History Narrative   No regular exercise due to foot surgery   NO DIET   Daily caffeine     Family History  Problem Relation Age of Onset  . Asthma Father   . Hypertension Father   . Colon polyps Father   . Heart disease Father     S/P ablation for AF  . Stroke Father     cns bleed on coumadin  . Hypertension Mother     bilateral endarterectomies,pacer,mesenteric artery bypass, renal artery stenosis stented unilaterally, intestinal perforation, steroids for colitis induced osteoprosis, polyps   . Cancer Mother  uterine S/P Tamoxifen  . Transient ischemic attack Mother     in early 38's  . Colon polyps Mother   . Irritable bowel syndrome Mother   . Breast cancer Mother   . Heart disease Mother     pacer  . Transient ischemic attack Mother   . Hypertension Sister     2 sisters  . Cancer Maternal Grandmother     uterine  . Diabetes Maternal Grandmother   . Cancer Sister     cervical  . Colon cancer Neg Hx     Review of Systems  Constitutional: Negative for fever, appetite change and fatigue.  HENT: Positive for congestion and sinus pressure. Negative for ear pain and sore throat.   Eyes: Negative for visual disturbance.       Eyes watering  Respiratory: Negative for cough, shortness of breath and wheezing.   Cardiovascular: Positive for chest pain (occ transient sharp chest pain) and palpitations (occasinal, no change). Negative for leg swelling.  Gastrointestinal: Positive for  diarrhea (frequent). Negative for nausea, abdominal pain, constipation and blood in stool.       No GERD  Endocrine: Negative for polydipsia and polyuria.  Genitourinary: Negative for dysuria and hematuria.  Musculoskeletal: Negative for back pain and arthralgias.  Skin: Positive for rash (poison ivy).  Neurological: Positive for numbness (hands - works on computer all day) and headaches (sinus headache now). Negative for dizziness, weakness and light-headedness.  Psychiatric/Behavioral: Negative for dysphoric mood. The patient is not nervous/anxious.        Objective:   Filed Vitals:   04/13/15 0802  BP: 134/86  Pulse: 70  Temp: 98.1 F (36.7 C)  Resp: 16   Filed Weights   04/13/15 0802  Weight: 176 lb (79.833 kg)   Body mass index is 29.29 kg/(m^2).   Physical Exam Constitutional: She appears well-developed and well-nourished. No distress.  HENT:  Head: Normocephalic and atraumatic.  Right Ear: External ear normal. Normal ear canal and TM Left Ear: External ear normal.  Normal ear canal and TM Mouth/Throat: Oropharynx is clear and moist.  Normal bilateral ear canals and tympanic membranes  Eyes: Conjunctivae and EOM are normal.  Neck: Neck supple. No tracheal deviation present. No thyromegaly present.  No carotid bruit  Cardiovascular: Normal rate, regular rhythm and normal heart sounds.   No murmur heard.  No edema. Pulmonary/Chest: Effort normal and breath sounds normal. No respiratory distress. She has no wheezes. She has no rales.  Breast: deferred to Gyn Abdominal: Soft. She exhibits no distension. There is no tenderness.  Lymphadenopathy: She has no cervical adenopathy.  Skin: Skin is warm and dry. She is not diaphoretic.  Psychiatric: She has a normal mood and affect. Her behavior is normal.      Assessment & Plan:   Physical exam: Screening blood work  Ordered, consented to hiv, hep C Immunizations  Up to date Colonoscopy  Up to date Mammogram  Up to  date Gyn Up to date EKG done 2016, has been abnormal but consistent - recheck EKG for stability today Exercise - exercising regularly, but only twice a week - advised increasing it Weight - overweight, advised weight loss Skin - no concerns/changes Substance abuse - none dexa - ordered, sister has OP and she is postmenopausal.  Will also check vit D and encouraged good calcium intake - ideally through food, increase exercise  See Problem List for Assessment and Plan of chronic medical problems.  Follow up annually for a PE

## 2015-04-14 ENCOUNTER — Other Ambulatory Visit: Payer: Self-pay | Admitting: Internal Medicine

## 2015-04-15 ENCOUNTER — Encounter: Payer: Self-pay | Admitting: Internal Medicine

## 2015-04-15 NOTE — Telephone Encounter (Signed)
augmentin may or may not treat a UTI - her blood work will not show if she has a UTI.  She should give a urine sample - UA and UCx.

## 2015-04-16 ENCOUNTER — Emergency Department (HOSPITAL_COMMUNITY)
Admission: EM | Admit: 2015-04-16 | Discharge: 2015-04-16 | Disposition: A | Discharge status: Home / Self Care | Payer: 59 | Attending: Emergency Medicine | Admitting: Emergency Medicine

## 2015-04-16 ENCOUNTER — Encounter (HOSPITAL_COMMUNITY): Payer: Self-pay | Admitting: Emergency Medicine

## 2015-04-16 DIAGNOSIS — Z8719 Personal history of other diseases of the digestive system: Secondary | ICD-10-CM | POA: Diagnosis not present

## 2015-04-16 DIAGNOSIS — Z79899 Other long term (current) drug therapy: Secondary | ICD-10-CM | POA: Insufficient documentation

## 2015-04-16 DIAGNOSIS — Z792 Long term (current) use of antibiotics: Secondary | ICD-10-CM | POA: Insufficient documentation

## 2015-04-16 DIAGNOSIS — R252 Cramp and spasm: Secondary | ICD-10-CM | POA: Diagnosis present

## 2015-04-16 DIAGNOSIS — Z7982 Long term (current) use of aspirin: Secondary | ICD-10-CM | POA: Diagnosis not present

## 2015-04-16 DIAGNOSIS — Z862 Personal history of diseases of the blood and blood-forming organs and certain disorders involving the immune mechanism: Secondary | ICD-10-CM | POA: Insufficient documentation

## 2015-04-16 DIAGNOSIS — Z8739 Personal history of other diseases of the musculoskeletal system and connective tissue: Secondary | ICD-10-CM | POA: Diagnosis not present

## 2015-04-16 DIAGNOSIS — I10 Essential (primary) hypertension: Secondary | ICD-10-CM | POA: Diagnosis not present

## 2015-04-16 LAB — URINALYSIS, ROUTINE W REFLEX MICROSCOPIC
Bilirubin Urine: NEGATIVE
Glucose, UA: NEGATIVE mg/dL
HGB URINE DIPSTICK: NEGATIVE
KETONES UR: NEGATIVE mg/dL
LEUKOCYTES UA: NEGATIVE
Nitrite: NEGATIVE
PROTEIN: NEGATIVE mg/dL
Specific Gravity, Urine: 1.035 — ABNORMAL HIGH (ref 1.005–1.030)
pH: 6 (ref 5.0–8.0)

## 2015-04-16 LAB — VITAMIN D 1,25 DIHYDROXY
VITAMIN D 1, 25 (OH) TOTAL: 56 pg/mL (ref 18–72)
VITAMIN D3 1, 25 (OH): 56 pg/mL

## 2015-04-16 MED ORDER — METHOCARBAMOL 500 MG PO TABS: 500.0000 mg | ORAL_TABLET | Freq: Two times a day (BID) | ORAL | Status: DC

## 2015-04-16 NOTE — Discharge Instructions (Signed)
You have been seen today for leg cramping. Your lab tests showed no abnormalities. Follow up with PCP as is possible for reevaluation and chronic management. Return to ED should symptoms worsen. Increase fluid intake to avoid dehydration. Take all medications as directed.

## 2015-04-16 NOTE — ED Provider Notes (Signed)
CSN: [id number]     Arrival date & time 04/16/15  1013 History   First MD Initiated Contact with Patient 04/16/15 1120     Chief Complaint  Patient presents with  . leg cramps      (Consider location/radiation/quality/duration/timing/severity/associated sxs/prior Treatment) HPI   Whitney Blake is a 55 y.o. female, with a history of migraines and hypertension, presenting to the ED with upper leg cramping that began on Feb 28. Pt rates her pain at 7/10, nonradiating. Pt states she began taking Augmentin for sinusitis earlier in the day on Feb 28. Pt states she has had this medication in the past without difficulty. Pt adds that that last couple times she has had this kind of pain, it was due to a UTI. Pt denies fever/chills, N/V/D, palpitations, chest pain, abdominal pain, urinary issues, or any other complaints.    Past Medical History  Diagnosis Date  . Nonspecific ST-T changes 2007  . Normal nuclear stress test 2007  . Cervical spinal stenosis     Dr Arnoldo Morale, [place]  . Migraines     menstrual  . Anemia     PMH of  . Hypertension   . Internal hemorrhoids    Past Surgical History  Procedure Laterality Date  . Tonsillectomy and adenoidectomy    . Orif clavicular fracture      bilaterally @ age 12  . Concussion      DUE TO FALL AT AGE 37  . Lumbar laminectomy  2003    FOR RUPTURED DISC  . Foot surgery      Right   . Colonoscopy  2012    neg; [facility]   Family History  Problem Relation Age of Onset  . Asthma Father   . Hypertension Father   . Colon polyps Father   . Heart disease Father     S/P ablation for AF  . Stroke Father     cns bleed on coumadin  . Hypertension Mother     bilateral endarterectomies,pacer,mesenteric artery bypass, renal artery stenosis stented unilaterally, intestinal perforation, steroids for colitis induced osteoprosis, polyps   . Cancer Mother     uterine S/P Tamoxifen  . Transient ischemic attack Mother     in early 72's  . Colon polyps  Mother   . Irritable bowel syndrome Mother   . Breast cancer Mother   . Heart disease Mother     pacer  . Transient ischemic attack Mother   . Hypertension Sister     2 sisters  . Cancer Maternal Grandmother     uterine  . Diabetes Maternal Grandmother   . Cancer Sister     cervical  . Colon cancer Neg Hx    Social History  Substance Use Topics  . Smoking status: Never Smoker   . Smokeless tobacco: Never Used  . Alcohol Use: No   OB History    Gravida Para Term Preterm AB TAB SAB Ectopic Multiple Living   2 2              Obstetric Comments   C-Section x1     Review of Systems  Constitutional: Negative for fever, chills and diaphoresis.  Respiratory: Negative for chest tightness and shortness of breath.   Cardiovascular: Negative for chest pain and palpitations.  Gastrointestinal: Negative for nausea, vomiting and abdominal pain.  Endocrine: Negative.   Genitourinary: Negative for dysuria and hematuria.  Musculoskeletal: Positive for myalgias (Upper leg cramping). Negative for back pain and arthralgias.  Skin: Negative for color change, pallor and wound.  Neurological: Negative for dizziness and light-headedness.  All other systems reviewed and are negative.     Allergies  Rofecoxib and Coreg  Home Medications   Prior to Admission medications   Medication Sig Start Date End Date Taking? Authorizing Provider  amoxicillin-clavulanate (AUGMENTIN) 875-125 MG tablet Take 1 tablet by mouth 2 (two) times daily. 04/13/15  Yes Binnie Rail, MD  aspirin 81 MG tablet Take 81 mg by mouth daily.   Yes Historical Provider, MD  gabapentin (NEURONTIN) 300 MG capsule TAKE ONE CAPSULE BY MOUTH AT BEDTIME AS NEEDED Patient taking differently: Take 300 mg by mouth at bedtime as needed for pain 04/14/15  Yes Binnie Rail, MD  losartan-hydrochlorothiazide (HYZAAR) 50-12.5 MG tablet TAKE 1 TABLET BY MOUTH DAILY. 12/08/14  Yes Hendricks Limes, MD  methocarbamol (ROBAXIN) 500 MG tablet  Take 1 tablet (500 mg total) by mouth 2 (two) times daily. 04/16/15   Gerardine Peltz C Artina Minella, PA-C   BP 139/96 mmHg  Pulse 81  Temp(Src) 98 F (36.7 C)  Resp 16  SpO2 96% Physical Exam  Constitutional: She appears well-developed and well-nourished. No distress.  HENT:  Head: Normocephalic and atraumatic.  Eyes: Conjunctivae are normal.  Neck: Neck supple.  Cardiovascular: Normal rate, regular rhythm, normal heart sounds and intact distal pulses.   Pulmonary/Chest: Effort normal and breath sounds normal. No respiratory distress.  Abdominal: Soft. Bowel sounds are normal. There is no tenderness. There is no guarding and no CVA tenderness.  Musculoskeletal: She exhibits no edema or tenderness.  Full ROM in all extremities and spine. No paraspinal tenderness. Patient has no point tenderness in the areas described as cramping. No muscle tightness discernible. No swelling, erythema, or increased warmth noted.  Lymphadenopathy:    She has no cervical adenopathy.       Right: No inguinal adenopathy present.       Left: No inguinal adenopathy present.  Neurological: She is alert. She has normal reflexes. She displays a negative Romberg sign.  No sensory deficits. Strength 5/5 in all extremities. No gait disturbance. Coordination intact.   Skin: Skin is warm and dry. She is not diaphoretic.  Psychiatric: She has a normal mood and affect. Her behavior is normal.  Nursing note and vitals reviewed.   ED Course  Procedures (including critical care time) Labs Review Labs Reviewed  URINALYSIS, ROUTINE W REFLEX MICROSCOPIC (NOT AT [facility]) - Abnormal; Notable for the following:    Specific Gravity, Urine 1.035 (*)    All other components within normal limits    Imaging Review No results found. I have personally reviewed and evaluated these lab results as part of my medical decision-making.   EKG Interpretation None      MDM   Final diagnoses:  Cramp of both lower extremities    Whitney Blake  presents with bilateral upper leg cramping for the last 4-5 days.  Pt had a CBC and CMP performed on Feb 28 without abnormalities. Specifically, no potassium abnormalities. Patient is able to ambulate without difficulty. I do not suspect rhabdomyolysis, DVT, or reaction to the Augmentin. Wells criteria for DVT is 0, indicating low risk. Patient voices no other new medications. No indication for imaging at this time. No abnormalities on the UA. Patient advised to follow up with PCP on Monday, March 6. Home care instructions and return precautions discussed. Patient voiced understanding of these instructions and is comfortable with discharge.  Filed Vitals:  04/16/15 1030  BP: 139/96  Pulse: 81  Temp: 98 F (36.7 C)  Resp: 16  SpO2: 96%     Lorayne Bender, PA-C 04/16/15 1237  Tanna Furry, MD 04/26/15 [phone]

## 2015-04-16 NOTE — ED Notes (Signed)
Per pt, states she is on Augmentin for a sinus infection-started on Tuesday-starting having upper thing pain and cramping-

## 2015-04-17 ENCOUNTER — Encounter: Payer: Self-pay | Admitting: Internal Medicine

## 2015-04-22 ENCOUNTER — Ambulatory Visit (INDEPENDENT_AMBULATORY_CARE_PROVIDER_SITE_OTHER): Payer: 59 | Admitting: Internal Medicine

## 2015-04-22 ENCOUNTER — Encounter: Payer: Self-pay | Admitting: Internal Medicine

## 2015-04-22 VITALS — BP 120/80 | HR 73 | Temp 98.0°F | Ht 65.0 in | Wt 168.0 lb

## 2015-04-22 DIAGNOSIS — J019 Acute sinusitis, unspecified: Secondary | ICD-10-CM | POA: Insufficient documentation

## 2015-04-22 DIAGNOSIS — M79604 Pain in right leg: Secondary | ICD-10-CM | POA: Diagnosis not present

## 2015-04-22 DIAGNOSIS — M79605 Pain in left leg: Secondary | ICD-10-CM

## 2015-04-22 DIAGNOSIS — J329 Chronic sinusitis, unspecified: Secondary | ICD-10-CM | POA: Insufficient documentation

## 2015-04-22 DIAGNOSIS — F411 Generalized anxiety disorder: Secondary | ICD-10-CM | POA: Diagnosis not present

## 2015-04-22 DIAGNOSIS — I10 Essential (primary) hypertension: Secondary | ICD-10-CM

## 2015-04-22 MED ORDER — LEVOFLOXACIN 500 MG PO TABS: 500.0000 mg | ORAL_TABLET | Freq: Every day | ORAL | Status: DC

## 2015-04-22 NOTE — Patient Instructions (Signed)
Please take all new medication as prescribed - the antibiotic  Please continue all other medications as before, and refills have been done if requested.  Please have the pharmacy call with any other refills you may need.  Please keep your appointments with your specialists as you may have planned   

## 2015-04-22 NOTE — Progress Notes (Signed)
Pre visit review using our clinic review tool, if applicable. No additional management support is needed unless otherwise documented below in the visit note. 

## 2015-04-23 ENCOUNTER — Telehealth: Payer: Self-pay | Admitting: Internal Medicine

## 2015-04-23 DIAGNOSIS — M79604 Pain in right leg: Secondary | ICD-10-CM | POA: Insufficient documentation

## 2015-04-23 DIAGNOSIS — F411 Generalized anxiety disorder: Secondary | ICD-10-CM | POA: Insufficient documentation

## 2015-04-23 DIAGNOSIS — M79605 Pain in left leg: Secondary | ICD-10-CM

## 2015-04-23 NOTE — Assessment & Plan Note (Addendum)
Has hx of spinal stenois, but temporally only occurring with this illness, exam benign, suspect is likely acute infectious illness related myalgias, but consider MRI LS spine for f/u if persists

## 2015-04-23 NOTE — Progress Notes (Signed)
Subjective:    Patient ID: Whitney Blake, female    DOB: 1960-04-18, 55 y.o.   MRN: [id number]  HPI  Here with rather prolonged illness, wax and waning it seems for last 2 wks, primaraily sinusitis complaint assoc with somewhat unusual post upper leg aching as well; has hx of lumbar disc dz but no specific LBP or radicular symptoms, and not affected but position, standing, walking. Overall some improved it seemed with recent augmentin but stopped after 6 days thinking it might be causing some of the pain, overall now worse again, No fever but  Here with 2-3 days acute onset facial pain, pressure, headache, general weakness and malaise, with mild ST and cough, but pt denies chest pain, wheezing, increased sob or doe, orthopnea, PND, increased LE swelling, palpitations, dizziness or syncope. Has overall lost 10 lbs during this episode of illness, low appetite, also with achy pain in last few days to knees and elbows.  No sick contacts  Denies urinary symptoms such as dysuria, frequency, urgency, flank pain, hematuria or n/v, chills. Denies worsening depressive symptoms, suicidal ideation, or panic; but very nervous today Past Medical History  Diagnosis Date  . Nonspecific ST-T changes 2007  . Normal nuclear stress test 2007  . Cervical spinal stenosis     Dr Arnoldo Morale, [place]  . Migraines     menstrual  . Anemia     PMH of  . Hypertension   . Internal hemorrhoids    Past Surgical History  Procedure Laterality Date  . Tonsillectomy and adenoidectomy    . Orif clavicular fracture      bilaterally @ age 89  . Concussion      DUE TO FALL AT AGE 47  . Lumbar laminectomy  2003    FOR RUPTURED DISC  . Foot surgery      Right   . Colonoscopy  2012    neg; [facility]    reports that she has never smoked. She has never used smokeless tobacco. She reports that she does not drink alcohol or use illicit drugs. family history includes Asthma in her father; Breast cancer in her mother; Cancer in her  maternal grandmother, mother, and sister; Colon polyps in her father and mother; Diabetes in her maternal grandmother; Heart disease in her father and mother; Hypertension in her father, mother, and sister; Irritable bowel syndrome in her mother; Stroke in her father; Transient ischemic attack in her mother and mother. There is no history of Colon cancer. Allergies  Allergen Reactions  . Rofecoxib Swelling    Vioxx caused  swelling of hands& feet  . Coreg [Carvedilol] Other (See Comments)    Caused hot flashes   Current Outpatient Prescriptions on File Prior to Visit  Medication Sig Dispense Refill  . aspirin 81 MG tablet Take 81 mg by mouth daily.    [name] gabapentin (NEURONTIN) 300 MG capsule TAKE ONE CAPSULE BY MOUTH AT BEDTIME AS NEEDED (Patient taking differently: Take 300 mg by mouth at bedtime as needed for pain) 90 capsule 2  . losartan-hydrochlorothiazide (HYZAAR) 50-12.5 MG tablet TAKE 1 TABLET BY MOUTH DAILY. 30 tablet 5  . methocarbamol (ROBAXIN) 500 MG tablet Take 1 tablet (500 mg total) by mouth 2 (two) times daily. (Patient not taking: Reported on 04/22/2015) 20 tablet 0   No current facility-administered medications on file prior to visit.   Review of Systems  Constitutional: Negative for unusual diaphoresis or night sweats HENT: Negative for ringing in ear or discharge Eyes:  Negative for double vision or worsening visual disturbance.  Respiratory: Negative for choking and stridor.   Gastrointestinal: Negative for vomiting or other signifcant bowel change Genitourinary: Negative for hematuria or change in urine volume.  Musculoskeletal: Negative for other MSK pain or swelling Skin: Negative for color change and worsening wound.  Neurological: Negative for tremors and numbness other than noted  Psychiatric/Behavioral: Negative for decreased concentration or agitation other than above       Objective:   Physical Exam BP 120/80 mmHg  Pulse 73  Temp(Src) 98 F (36.7 C) (Oral)   Ht 5\' 5"  (1.651 m)  Wt 168 lb (76.204 kg)  BMI 27.96 kg/m2  SpO2 99% VS noted, mild ill Constitutional: Pt appears in no significant distress HENT: Head: NCAT.  Right Ear: External ear normal.  Left Ear: External ear normal.  Bilat tm's with mild erythema.  Max sinus areas mild tender.  Pharynx with mild erythema, no exudate Eyes: . Pupils are equal, round, and reactive to light. Conjunctivae and EOM are normal Neck: Normal range of motion. Neck supple.  Cardiovascular: Normal rate and regular rhythm.   Pulmonary/Chest: Effort normal and breath sounds without rales or wheezing.  Neurological: Pt is alert. Not confused , motor grossly intact Skin: Skin is warm. No rash, no LE edema Psychiatric: Pt behavior is normal. No agitation. but 2+ nervous No spine tender No elbow or knee effusions or tender    Assessment & Plan:

## 2015-04-23 NOTE — Telephone Encounter (Signed)
Called advised

## 2015-04-23 NOTE — Assessment & Plan Note (Signed)
Mild to mod, for antibx course,  to f/u any worsening symptoms or concerns 

## 2015-04-23 NOTE — Telephone Encounter (Signed)
Patient called in to ask if it is ok for her to go into work today or if her virus is communicable.   Advised that per the visit notes it indicates that she had acute sinusitis. She advised that she didn't have a sinus infection- and that she thought she had the flu. She advised that you talked about it being a "virus".   Please advise on this and when it would be ok for the patient to return to work.

## 2015-04-23 NOTE — Assessment & Plan Note (Signed)
Large element today, d/w pt, tried to reassure, declines further tx,  to f/u any worsening symptoms or concerns

## 2015-04-23 NOTE — Assessment & Plan Note (Signed)
stable overall by history and exam, recent data reviewed with pt, and pt to continue medical treatment as before,  to f/u any worsening symptoms or concerns BP Readings from Last 3 Encounters:  04/22/15 120/80  04/16/15 124/87  04/13/15 134/86

## 2015-04-23 NOTE — Telephone Encounter (Signed)
Should be OK at this time since the fever has improved., thanks

## 2015-05-19 ENCOUNTER — Ambulatory Visit (INDEPENDENT_AMBULATORY_CARE_PROVIDER_SITE_OTHER)
Admission: RE | Admit: 2015-05-19 | Discharge: 2015-05-19 | Disposition: A | Discharge status: Home / Self Care | Payer: 59 | Source: Ambulatory Visit | Attending: Internal Medicine | Admitting: Internal Medicine

## 2015-05-19 DIAGNOSIS — Z780 Asymptomatic menopausal state: Secondary | ICD-10-CM

## 2015-05-19 DIAGNOSIS — Z8262 Family history of osteoporosis: Secondary | ICD-10-CM | POA: Diagnosis not present

## 2015-05-24 ENCOUNTER — Encounter: Payer: Self-pay | Admitting: Internal Medicine

## 2015-06-14 ENCOUNTER — Other Ambulatory Visit: Payer: Self-pay

## 2015-06-14 MED ORDER — LOSARTAN POTASSIUM-HCTZ 50-12.5 MG PO TABS: 1.0000 | ORAL_TABLET | Freq: Every day | ORAL | Status: DC

## 2015-08-10 ENCOUNTER — Other Ambulatory Visit: Payer: Self-pay | Admitting: Internal Medicine

## 2015-08-10 DIAGNOSIS — Z1231 Encounter for screening mammogram for malignant neoplasm of breast: Secondary | ICD-10-CM

## 2015-08-18 ENCOUNTER — Other Ambulatory Visit: Payer: Self-pay | Admitting: *Deleted

## 2015-08-18 MED ORDER — LOSARTAN POTASSIUM-HCTZ 50-12.5 MG PO TABS: 1.0000 | ORAL_TABLET | Freq: Every day | ORAL | Status: DC

## 2015-08-20 ENCOUNTER — Encounter: Payer: Self-pay | Admitting: Internal Medicine

## 2015-08-21 MED ORDER — LOSARTAN POTASSIUM-HCTZ 100-12.5 MG PO TABS: 1.0000 | ORAL_TABLET | Freq: Every day | ORAL | Status: DC

## 2015-08-23 ENCOUNTER — Ambulatory Visit
Admission: RE | Admit: 2015-08-23 | Discharge: 2015-08-23 | Disposition: A | Discharge status: Home / Self Care | Payer: 59 | Source: Ambulatory Visit | Attending: Internal Medicine | Admitting: Internal Medicine

## 2015-08-23 DIAGNOSIS — Z1231 Encounter for screening mammogram for malignant neoplasm of breast: Secondary | ICD-10-CM

## 2015-08-25 ENCOUNTER — Other Ambulatory Visit: Payer: Self-pay | Admitting: Internal Medicine

## 2015-08-25 DIAGNOSIS — R928 Other abnormal and inconclusive findings on diagnostic imaging of breast: Secondary | ICD-10-CM

## 2015-09-01 ENCOUNTER — Ambulatory Visit
Admission: RE | Admit: 2015-09-01 | Discharge: 2015-09-01 | Disposition: A | Discharge status: Home / Self Care | Payer: 59 | Source: Ambulatory Visit | Attending: Internal Medicine | Admitting: Internal Medicine

## 2015-09-01 DIAGNOSIS — R928 Other abnormal and inconclusive findings on diagnostic imaging of breast: Secondary | ICD-10-CM

## 2015-09-06 ENCOUNTER — Encounter: Payer: Self-pay | Admitting: Internal Medicine

## 2015-09-07 NOTE — Telephone Encounter (Signed)
Please call her to schedule a follow up for her htn.     She can go ahead and just stop the gabapentin - she does not need to taper off.

## 2015-10-07 ENCOUNTER — Encounter: Payer: Self-pay | Admitting: Internal Medicine

## 2015-10-07 ENCOUNTER — Ambulatory Visit (INDEPENDENT_AMBULATORY_CARE_PROVIDER_SITE_OTHER): Payer: 59 | Admitting: Internal Medicine

## 2015-10-07 VITALS — BP 124/80 | HR 63 | Temp 97.7°F | Resp 16 | Wt 180.0 lb

## 2015-10-07 DIAGNOSIS — I10 Essential (primary) hypertension: Secondary | ICD-10-CM

## 2015-10-07 NOTE — Progress Notes (Signed)
Subjective:    Patient ID: Whitney Blake, female    DOB: 1961/02/03, 55 y.o.   MRN: [id number]  HPI The patient is here for follow up.  Hypertension: She is taking her medication daily. She is compliant with a low sodium diet.  She denies chest pain, palpitations, edema, shortness of breath and regular headaches. She is exercising regularly.  She does monitor her blood pressure at home, it is controlled.     Medications and allergies reviewed with patient and updated if appropriate.  Patient Active Problem List   Diagnosis Date Noted  . Leg pain, bilateral 04/23/2015  . Anxiety state 04/23/2015  . Acute sinus infection 04/22/2015  . Nephrolithiasis 09/06/2012  . Essential hypertension 12/07/2011  . HOT FLASHES 02/03/2010  . Sleep disorder 05/25/2009  . Migraine 12/21/2008  . Postmenopausal 12/21/2008  . Anemia 09/03/2007  . NONSPECIFIC ABNORMAL ELECTROCARDIOGRAM 09/03/2007  . SPINAL STENOSIS 06/14/2006    Current Outpatient Prescriptions on File Prior to Visit  Medication Sig Dispense Refill  . aspirin 81 MG tablet Take 81 mg by mouth daily.    [name] losartan-hydrochlorothiazide (HYZAAR) 100-12.5 MG tablet Take 1 tablet by mouth daily. 30 tablet 3   No current facility-administered medications on file prior to visit.     Past Medical History:  Diagnosis Date  . Anemia    PMH of  . Cervical spinal stenosis    Dr Arnoldo Morale, [place]  . Hypertension   . Internal hemorrhoids   . Migraines    menstrual  . Nonspecific ST-T changes 2007  . Normal nuclear stress test 2007    Past Surgical History:  Procedure Laterality Date  . COLONOSCOPY  2012   neg; [facility]  . CONCUSSION     DUE TO FALL AT AGE 53  . FOOT SURGERY     Right   . LUMBAR LAMINECTOMY  2003   FOR RUPTURED DISC  . ORIF CLAVICULAR FRACTURE     bilaterally @ age 39  . TONSILLECTOMY AND ADENOIDECTOMY      Social History   Social History  . Marital status: Married    Spouse name: N/A  . Number of children:  2  . Years of education: N/A   Occupational History  . Computer Programmer [employer]   Social History Main Topics  . Smoking status: Never Smoker  . Smokeless tobacco: Never Used  . Alcohol use No  . Drug use: No  . Sexual activity: Not on file   Other Topics Concern  . Not on file   Social History Narrative   Exercises twice a week - [employer]   Daily caffeine     Family History  Problem Relation Age of Onset  . Asthma Father   . Hypertension Father   . Colon polyps Father   . Heart disease Father     S/P ablation for AF  . Stroke Father     cns bleed on coumadin  . Hypertension Mother     bilateral endarterectomies,pacer,mesenteric artery bypass, renal artery stenosis stented unilaterally, intestinal perforation, steroids for colitis induced osteoprosis, polyps   . Cancer Mother     uterine S/P Tamoxifen  . Transient ischemic attack Mother     in early 75's  . Colon polyps Mother   . Irritable bowel syndrome Mother   . Breast cancer Mother   . Heart disease Mother     pacer  . Transient ischemic attack Mother   . Hypertension Sister  2 sisters  . Cancer Maternal Grandmother     uterine  . Diabetes Maternal Grandmother   . Cancer Sister     cervical  . Colon cancer Neg Hx     Review of Systems  Constitutional: Negative for fever.  Respiratory: Negative for cough, shortness of breath and wheezing.   Cardiovascular: Negative for chest pain, palpitations and leg swelling.  Neurological: Negative for light-headedness and headaches.       Objective:   Vitals:   10/07/15 0826  BP: 124/80  Pulse: 63  Resp: 16  Temp: 97.7 F (36.5 C)   Filed Weights   10/07/15 0826  Weight: 180 lb (81.6 kg)   Body mass index is 29.95 kg/m.   Physical Exam    Constitutional: Appears well-developed and well-nourished. No distress.  HENT:  Head: Normocephalic and atraumatic.  Neck: Neck supple. No tracheal deviation present. No thyromegaly present.    Cardiovascular: Normal rate, regular rhythm and normal heart sounds.   No murmur heard. No carotid bruit  Pulmonary/Chest: Effort normal and breath sounds normal. No respiratory distress. No has no wheezes. No rales.  Musculoskeletal: No edema.  Lymphadenopathy: No cervical adenopathy.  Skin: Skin is warm and dry. Not diaphoretic.  Psychiatric: Normal mood and affect. Behavior is normal.     Assessment & Plan:    See Problem List for Assessment and Plan of chronic medical problems.

## 2015-10-07 NOTE — Patient Instructions (Addendum)
  All other Health Maintenance issues reviewed.   All recommended immunizations and age-appropriate screenings are up-to-date or discussed.  No immunizations administered today.   Medications reviewed and updated.  No changes recommended at this time.    Please followup in one year

## 2015-10-07 NOTE — Progress Notes (Signed)
Pre visit review using our clinic review tool, if applicable. No additional management support is needed unless otherwise documented below in the visit note. 

## 2015-10-07 NOTE — Assessment & Plan Note (Signed)
BP well controlled Current regimen effective and well tolerated Continue current medications at current doses  

## 2015-12-19 ENCOUNTER — Other Ambulatory Visit: Payer: Self-pay | Admitting: Internal Medicine

## 2016-02-15 ENCOUNTER — Encounter: Payer: Self-pay | Admitting: Nurse Practitioner

## 2016-02-15 ENCOUNTER — Ambulatory Visit (INDEPENDENT_AMBULATORY_CARE_PROVIDER_SITE_OTHER): Payer: 59 | Admitting: Nurse Practitioner

## 2016-02-15 VITALS — BP 136/84 | HR 64 | Temp 97.8°F | Ht 66.0 in | Wt 179.0 lb

## 2016-02-15 DIAGNOSIS — K6289 Other specified diseases of anus and rectum: Secondary | ICD-10-CM | POA: Diagnosis not present

## 2016-02-15 DIAGNOSIS — K644 Residual hemorrhoidal skin tags: Secondary | ICD-10-CM

## 2016-02-15 DIAGNOSIS — J0140 Acute pansinusitis, unspecified: Secondary | ICD-10-CM

## 2016-02-15 MED ORDER — DOXYCYCLINE HYCLATE 100 MG PO TABS: 100.0000 mg | ORAL_TABLET | Freq: Two times a day (BID) | ORAL | 0 refills | Status: AC

## 2016-02-15 MED ORDER — METHYLPREDNISOLONE ACETATE 40 MG/ML IJ SUSP
40.0000 mg | Freq: Once | INTRAMUSCULAR | Status: AC
  Administered 2016-02-15: 40 mg via INTRAMUSCULAR

## 2016-02-15 MED ORDER — DM-GUAIFENESIN ER 30-600 MG PO TB12: 1.0000 | ORAL_TABLET | Freq: Two times a day (BID) | ORAL | 0 refills | Status: DC | PRN

## 2016-02-15 MED ORDER — SALINE SPRAY 0.65 % NA SOLN: 1.0000 | NASAL | 0 refills | Status: DC | PRN

## 2016-02-15 MED ORDER — HYDROCODONE-HOMATROPINE 5-1.5 MG/5ML PO SYRP: 5.0000 mL | ORAL_SOLUTION | Freq: Every evening | ORAL | 0 refills | Status: DC | PRN

## 2016-02-15 MED ORDER — FLUTICASONE PROPIONATE 50 MCG/ACT NA SUSP: 2.0000 | Freq: Every day | NASAL | 0 refills | Status: DC

## 2016-02-15 MED ORDER — OXYMETAZOLINE HCL 0.05 % NA SOLN: 1.0000 | Freq: Two times a day (BID) | NASAL | 0 refills | Status: DC

## 2016-02-15 NOTE — Patient Instructions (Addendum)
[facility] Instructions: Flonase and Afrin use: apply 1spray of afrin in each nare, wait 87mins, then apply 2sprays of flonase in each nare. Use both nasal spray consecutively x 3days, then flonase only for at least 14days.  Encourage adequate oral hydration.  Use over-the-counter  "cold" medicines  such as "Tylenol cold" , "Advil cold",  "Mucinex" or" Mucinex D"  for cough and congestion.  Avoid decongestants if you have high blood pressure. Use" Delsym" or" Robitussin" cough syrup varietis for cough.  You can use plain "Tylenol" or "Advi"l for fever, chills and achyness.   May use combination of clotrimazole cream and hydrocortisone 1% cream on peri rectal region, twice a day x 1week then stop. If no improvement, call office for referral to proctologist.  May also use preparation H ointment as needed for inflammed hemorrhoids.

## 2016-02-15 NOTE — Progress Notes (Signed)
Subjective:  Patient ID: Whitney Blake, female    DOB: 07-15-1960  Age: 56 y.o. MRN: [id number]  CC: Cough (coughing yellow mucus,congestion,slight sore throat for 1 wk. taking OTC)   Cough  This is a new problem. The current episode started in the past 7 days. The problem has been gradually worsening. The problem occurs constantly. The cough is productive of purulent sputum. Associated symptoms include chills, headaches, nasal congestion, postnasal drip, rhinorrhea and a sore throat. Pertinent negatives include no chest pain, ear congestion, ear pain, fever, heartburn, hemoptysis, myalgias, shortness of breath or wheezing. The symptoms are aggravated by lying down and cold air. She has tried OTC cough suppressant for the symptoms. The treatment provided no relief.   Rectal irritation: Ongoing and intermittent for over 49months. Waxing and waning. Describes as occasional burning sensation. Denies any itching. No improvement with mosturizer. No rectal bleeding.  Outpatient Medications Prior to Visit  Medication Sig Dispense Refill  . aspirin 81 MG tablet Take 81 mg by mouth daily.    [name] losartan-hydrochlorothiazide (HYZAAR) 100-12.5 MG tablet TAKE 1 TABLET BY MOUTH DAILY. 30 tablet 5   No facility-administered medications prior to visit.     ROS See HPI  Objective:  BP 136/84   Pulse 64   Temp 97.8 F (36.6 C)   Ht 5\' 6"  (1.676 m)   Wt 179 lb (81.2 kg)   LMP 02/14/2012   SpO2 98%   BMI 28.89 kg/m   BP Readings from Last 3 Encounters:  02/15/16 136/84  10/07/15 124/80  04/22/15 120/80    Wt Readings from Last 3 Encounters:  02/15/16 179 lb (81.2 kg)  10/07/15 180 lb (81.6 kg)  04/22/15 168 lb (76.2 kg)    Physical Exam  Constitutional: She is oriented to person, place, and time.  HENT:  Right Ear: Tympanic membrane, external ear and ear canal normal.  Left Ear: Tympanic membrane, external ear and ear canal normal.  Nose: Mucosal edema and rhinorrhea present. Right  sinus exhibits maxillary sinus tenderness and frontal sinus tenderness. Left sinus exhibits maxillary sinus tenderness and frontal sinus tenderness.  Mouth/Throat: Uvula is midline. No trismus in the jaw. Posterior oropharyngeal erythema present. No oropharyngeal exudate.  Eyes: No scleral icterus.  Neck: Normal range of motion. Neck supple.  Cardiovascular: Normal rate and normal heart sounds.   Pulmonary/Chest: Effort normal and breath sounds normal.  Genitourinary: Rectal exam shows external hemorrhoid. Rectal exam shows no fissure, no mass and no tenderness.  Genitourinary Comments: Normal peri rectal and perineum skin.  Musculoskeletal: She exhibits no edema.  Lymphadenopathy:    She has cervical adenopathy.  Neurological: She is alert and oriented to person, place, and time.  Vitals reviewed.   Lab Results  Component Value Date   WBC 6.6 04/13/2015   HGB 14.0 04/13/2015   HCT 41.7 04/13/2015   PLT 235.0 04/13/2015   GLUCOSE 89 04/13/2015   CHOL 185 04/13/2015   TRIG 156.0 (H) 04/13/2015   HDL 50.80 04/13/2015   LDLCALC 103 (H) 04/13/2015   ALT 22 04/13/2015   AST 19 04/13/2015   NA 142 04/13/2015   K 4.2 04/13/2015   CL 103 04/13/2015   CREATININE 1.00 04/13/2015   BUN 12 04/13/2015   CO2 33 (H) 04/13/2015   TSH 1.77 04/13/2015   HGBA1C 5.7 04/13/2015    Mm Diag Breast Tomo Uni Right  Result Date: 09/01/2015 CLINICAL DATA:  Patient was called back from screening mammogram for possible distortion in the  right breast. EXAM: 2D DIGITAL DIAGNOSTIC UNILATERAL RIGHT MAMMOGRAM WITH CAD AND ADJUNCT TOMO COMPARISON:  Previous exam(s). ACR Breast Density Category c: The breast tissue is heterogeneously dense, which may obscure small masses. FINDINGS: Additional imaging of the right breast was performed. Parenchymal pattern is stable from prior exams. No distortion, mass or malignant type microcalcifications identified. Mammographic images were processed with CAD. IMPRESSION: No  evidence of malignancy in the right breast. RECOMMENDATION: Bilateral screening mammogram in 1 year is recommended. I have discussed the findings and recommendations with the patient. Results were also provided in writing at the conclusion of the visit. If applicable, a reminder letter will be sent to the patient regarding the next appointment. BI-RADS CATEGORY  1: Negative. Electronically Signed   By: Lillia Mountain M.D.   On: 09/01/2015 10:58    Assessment & Plan:   Whitney Blake was seen today for cough.  Diagnoses and all orders for this visit:  Acute non-recurrent pansinusitis -     fluticasone (FLONASE) 50 MCG/ACT nasal spray; Place 2 sprays into both nostrils daily. -     oxymetazoline (AFRIN NASAL SPRAY) 0.05 % nasal spray; Place 1 spray into both nostrils 2 (two) times daily. Use only for 3days, then stop -     sodium chloride (OCEAN) 0.65 % SOLN nasal spray; Place 1 spray into both nostrils as needed for congestion. -     dextromethorphan-guaiFENesin (MUCINEX DM) 30-600 MG 12hr tablet; Take 1 tablet by mouth 2 (two) times daily as needed for cough. -     doxycycline (VIBRA-TABS) 100 MG tablet; Take 1 tablet (100 mg total) by mouth 2 (two) times daily. -     HYDROcodone-homatropine (HYCODAN) 5-1.5 MG/5ML syrup; Take 5 mLs by mouth at bedtime as needed for cough. -     methylPREDNISolone acetate (DEPO-MEDROL) injection 40 mg; Inject 1 mL (40 mg total) into the muscle once.  Irritation of perirectal skin  External hemorrhoids without complication   I am having Whitney Blake start on fluticasone, oxymetazoline, sodium chloride, dextromethorphan-guaiFENesin, doxycycline, and HYDROcodone-homatropine. I am also having her maintain her aspirin and losartan-hydrochlorothiazide. We will continue to administer methylPREDNISolone acetate.  Meds ordered this encounter  Medications  . fluticasone (FLONASE) 50 MCG/ACT nasal spray    Sig: Place 2 sprays into both nostrils daily.    Dispense:  16 g     Refill:  0    Order Specific Question:   Supervising Provider    Answer:   Whitney Blake [[id number]]  . oxymetazoline (AFRIN NASAL SPRAY) 0.05 % nasal spray    Sig: Place 1 spray into both nostrils 2 (two) times daily. Use only for 3days, then stop    Dispense:  30 mL    Refill:  0    Order Specific Question:   Supervising Provider    Answer:   Whitney Blake [[id number]]  . sodium chloride (OCEAN) 0.65 % SOLN nasal spray    Sig: Place 1 spray into both nostrils as needed for congestion.    Dispense:  15 mL    Refill:  0    Order Specific Question:   Supervising Provider    Answer:   Whitney Blake [[id number]]  . dextromethorphan-guaiFENesin (MUCINEX DM) 30-600 MG 12hr tablet    Sig: Take 1 tablet by mouth 2 (two) times daily as needed for cough.    Dispense:  14 tablet    Refill:  0    Order Specific Question:   Supervising Provider  Answer:   Whitney Blake [[id number]]  . doxycycline (VIBRA-TABS) 100 MG tablet    Sig: Take 1 tablet (100 mg total) by mouth 2 (two) times daily.    Dispense:  14 tablet    Refill:  0    Order Specific Question:   Supervising Provider    Answer:   Whitney Blake [[id number]]  . HYDROcodone-homatropine (HYCODAN) 5-1.5 MG/5ML syrup    Sig: Take 5 mLs by mouth at bedtime as needed for cough.    Dispense:  120 mL    Refill:  0    Order Specific Question:   Supervising Provider    Answer:   Whitney Blake [[id number]]  . methylPREDNISolone acetate (DEPO-MEDROL) injection 40 mg    Follow-up: Return if symptoms worsen or fail to improve.  Wilfred Lacy, NP

## 2016-02-15 NOTE — Progress Notes (Signed)
Pre visit review using our clinic review tool, if applicable. No additional management support is needed unless otherwise documented below in the visit note. 

## 2016-03-05 DIAGNOSIS — J069 Acute upper respiratory infection, unspecified: Secondary | ICD-10-CM | POA: Diagnosis not present

## 2016-04-25 ENCOUNTER — Other Ambulatory Visit: Payer: Self-pay | Admitting: *Deleted

## 2016-04-25 MED ORDER — LOSARTAN POTASSIUM-HCTZ 100-12.5 MG PO TABS: 1.0000 | ORAL_TABLET | Freq: Every day | ORAL | 1 refills | Status: DC

## 2016-04-27 ENCOUNTER — Other Ambulatory Visit (INDEPENDENT_AMBULATORY_CARE_PROVIDER_SITE_OTHER): Payer: 59

## 2016-04-27 ENCOUNTER — Encounter: Payer: Self-pay | Admitting: Internal Medicine

## 2016-04-27 ENCOUNTER — Ambulatory Visit (INDEPENDENT_AMBULATORY_CARE_PROVIDER_SITE_OTHER): Payer: 59 | Admitting: Internal Medicine

## 2016-04-27 VITALS — BP 144/80 | HR 68 | Temp 97.9°F | Ht 66.0 in | Wt 181.0 lb

## 2016-04-27 DIAGNOSIS — Z0000 Encounter for general adult medical examination without abnormal findings: Secondary | ICD-10-CM

## 2016-04-27 DIAGNOSIS — R739 Hyperglycemia, unspecified: Secondary | ICD-10-CM

## 2016-04-27 DIAGNOSIS — I10 Essential (primary) hypertension: Secondary | ICD-10-CM

## 2016-04-27 DIAGNOSIS — N644 Mastodynia: Secondary | ICD-10-CM | POA: Diagnosis not present

## 2016-04-27 LAB — COMPREHENSIVE METABOLIC PANEL
ALBUMIN: 4.2 g/dL (ref 3.5–5.2)
ALK PHOS: 49 U/L (ref 39–117)
ALT: 28 U/L (ref 0–35)
AST: 21 U/L (ref 0–37)
BUN: 17 mg/dL (ref 6–23)
CO2: 31 mEq/L (ref 19–32)
CREATININE: 0.95 mg/dL (ref 0.40–1.20)
Calcium: 9.7 mg/dL (ref 8.4–10.5)
Chloride: 103 mEq/L (ref 96–112)
GFR: 64.5 mL/min (ref >60.00)
Glucose, Bld: 88 mg/dL (ref 70–99)
Potassium: 3.9 mEq/L (ref 3.5–5.1)
Sodium: 141 mEq/L (ref 135–145)
Total Bilirubin: 0.3 mg/dL (ref 0.2–1.2)
Total Protein: 7.1 g/dL (ref 6.0–8.3)

## 2016-04-27 LAB — CBC WITH DIFFERENTIAL/PLATELET
BASOS ABS: 0.1 10*3/uL (ref 0.0–0.1)
BASOS PCT: 1.1 % (ref 0.0–3.0)
EOS ABS: 0.2 10*3/uL (ref 0.0–0.7)
Eosinophils Relative: 3.2 % (ref 0.0–5.0)
HEMATOCRIT: 42.1 % (ref 36.0–46.0)
Hemoglobin: 14 g/dL (ref 12.0–15.0)
LYMPHS PCT: 43.4 % (ref 12.0–46.0)
Lymphs Abs: 3.3 10*3/uL (ref 0.7–4.0)
MCHC: 33.3 g/dL (ref 30.0–36.0)
MCV: 87.6 fl (ref 78.0–100.0)
MONO ABS: 0.5 10*3/uL (ref 0.1–1.0)
Monocytes Relative: 6.3 % (ref 3.0–12.0)
NEUTROS ABS: 3.5 10*3/uL (ref 1.4–7.7)
Neutrophils Relative %: 46 % (ref 43.0–77.0)
PLATELETS: 251 10*3/uL (ref 150.0–400.0)
RBC: 4.8 Mil/uL (ref 3.87–5.11)
RDW: 13.8 % (ref 11.5–15.5)
WBC: 7.5 10*3/uL (ref 4.0–10.5)

## 2016-04-27 LAB — HEMOGLOBIN A1C: Hgb A1c MFr Bld: 5.8 % (ref 4.6–6.5)

## 2016-04-27 LAB — LIPID PANEL
CHOLESTEROL: 210 mg/dL — AB (ref 0–200)
HDL: 60.5 mg/dL (ref >39.00)
LDL Cholesterol: 120 mg/dL — ABNORMAL HIGH (ref 0–99)
NONHDL: 149.58
Total CHOL/HDL Ratio: 3
Triglycerides: 146 mg/dL (ref 0.0–149.0)
VLDL: 29.2 mg/dL (ref 0.0–40.0)

## 2016-04-27 LAB — TSH: TSH: 2.92 u[IU]/mL (ref 0.35–4.50)

## 2016-04-27 LAB — VITAMIN D 25 HYDROXY (VIT D DEFICIENCY, FRACTURES): VITD: 24.71 ng/mL — ABNORMAL LOW (ref 30.00–100.00)

## 2016-04-27 MED ORDER — THERA VITAL M PO TABS: 1.0000 | ORAL_TABLET | Freq: Every day | ORAL | Status: DC

## 2016-04-27 NOTE — Progress Notes (Signed)
Pre visit review using our clinic review tool, if applicable. No additional management support is needed unless otherwise documented below in the visit note. 

## 2016-04-27 NOTE — Progress Notes (Signed)
Subjective:    Patient ID: Whitney Blake, female    DOB: 1960/04/07, 56 y.o.   MRN: [id number]  HPI She is here for a physical exam.   She has a slight pain in her left breat.  It is not daily.  It may come 1-2 times a day.  She can not tell if it is her breast or deeper in her chest wall.  Movement does not increase the pain.   She noticed it over the past few months.  Her last mammogram was 08/2015.  Yesterday she woke up with left posterior neck pain.  She thinks she just slept wrong.  She used heat last night. She has had it in the past.    She has vulvar tenderness - it Daniel Ritthaler sometimes when she urinates.  Sex is painful.  She does see gyn annually.  She denies lesions. Her symptoms are intermittent.    Medications and allergies reviewed with patient and updated if appropriate.  Patient Active Problem List   Diagnosis Date Noted  . Leg pain, bilateral 04/23/2015  . Anxiety state 04/23/2015  . Nephrolithiasis 09/06/2012  . Essential hypertension 12/07/2011  . HOT FLASHES 02/03/2010  . Sleep disorder 05/25/2009  . Migraine 12/21/2008  . Postmenopausal 12/21/2008  . Anemia 09/03/2007  . NONSPECIFIC ABNORMAL ELECTROCARDIOGRAM 09/03/2007  . SPINAL STENOSIS 06/14/2006    Current Outpatient Prescriptions on File Prior to Visit  Medication Sig Dispense Refill  . aspirin 81 MG tablet Take 81 mg by mouth daily.    [name] losartan-hydrochlorothiazide (HYZAAR) 100-12.5 MG tablet Take 1 tablet by mouth daily. 90 tablet 1  . fluticasone (FLONASE) 50 MCG/ACT nasal spray Place 2 sprays into both nostrils daily. 16 g 0  . oxymetazoline (AFRIN NASAL SPRAY) 0.05 % nasal spray Place 1 spray into both nostrils 2 (two) times daily. Use only for 3days, then stop 30 mL 0  . sodium chloride (OCEAN) 0.65 % SOLN nasal spray Place 1 spray into both nostrils as needed for congestion. 15 mL 0   No current facility-administered medications on file prior to visit.     Past Medical History:  Diagnosis  Date  . Anemia    PMH of  . Cervical spinal stenosis    Dr Arnoldo Morale, [place]  . Hypertension   . Internal hemorrhoids   . Migraines    menstrual  . Nonspecific ST-T changes 2007  . Normal nuclear stress test 2007    Past Surgical History:  Procedure Laterality Date  . COLONOSCOPY  2012   neg; [facility]  . CONCUSSION     DUE TO FALL AT AGE 82  . FOOT SURGERY     Right   . LUMBAR LAMINECTOMY  2003   FOR RUPTURED DISC  . ORIF CLAVICULAR FRACTURE     bilaterally @ age 80  . TONSILLECTOMY AND ADENOIDECTOMY      Social History   Social History  . Marital status: Married    Spouse name: N/A  . Number of children: 2  . Years of education: N/A   Occupational History  . Computer Programmer [employer]   Social History Main Topics  . Smoking status: Never Smoker  . Smokeless tobacco: Never Used  . Alcohol use No  . Drug use: No  . Sexual activity: Not Asked   Other Topics Concern  . None   Social History Narrative   Exercises twice a week - boot camp   Daily caffeine     Family  History  Problem Relation Age of Onset  . Hypertension Mother     bilateral endarterectomies,pacer,mesenteric artery bypass, renal artery stenosis stented unilaterally, intestinal perforation, steroids for colitis induced osteoprosis, polyps   . Cancer Mother     uterine S/P Tamoxifen  . Transient ischemic attack Mother     in early 12's  . Colon polyps Mother   . Irritable bowel syndrome Mother   . Breast cancer Mother   . Heart disease Mother     pacer  . Asthma Father   . Hypertension Father   . Colon polyps Father   . Heart disease Father     S/P ablation for AF  . Stroke Father     cns bleed on coumadin  . Hypertension Sister     2 sisters  . Cancer Maternal Grandmother     uterine  . Diabetes Maternal Grandmother   . Cancer Sister     cervical  . Colon cancer Neg Hx     Review of Systems  Constitutional: Negative for chills and fever.       Hot flashes - improving  slowly  Eyes: Negative for visual disturbance.  Respiratory: Negative for cough, shortness of breath and wheezing.   Cardiovascular: Positive for palpitations (occasional). Negative for chest pain and leg swelling.  Gastrointestinal: Negative for abdominal pain, blood in stool, constipation, diarrhea and nausea.       No gerd  Genitourinary: Positive for frequency. Negative for dysuria and hematuria.  Musculoskeletal: Positive for neck pain. Negative for arthralgias, back pain and myalgias.  Skin: Negative for color change and rash.  Neurological: Negative for dizziness, light-headedness and headaches.  Psychiatric/Behavioral: Positive for sleep disturbance (hot flashes). Negative for dysphoric mood. The patient is not nervous/anxious.        Objective:   Vitals:   04/27/16 0913  BP: (!) 144/80  Pulse: 68  Temp: 97.9 F (36.6 C)   Filed Weights   04/27/16 0913  Weight: 181 lb (82.1 kg)   Body mass index is 29.21 kg/m.  Wt Readings from Last 3 Encounters:  04/27/16 181 lb (82.1 kg)  02/15/16 179 lb (81.2 kg)  10/07/15 180 lb (81.6 kg)     Physical Exam Constitutional: She appears well-developed and well-nourished. No distress.  HENT:  Head: Normocephalic and atraumatic.  Right Ear: External ear normal. Normal ear canal and TM Left Ear: External ear normal.  Normal ear canal and TM Mouth/Throat: Oropharynx is clear and moist.  Eyes: Conjunctivae and EOM are normal.  Neck: Neck supple. No tracheal deviation present. No thyromegaly present.  No carotid bruit  Cardiovascular: Normal rate, regular rhythm and normal heart sounds.   No murmur heard.  No edema. Pulmonary/Chest: Effort normal and breath sounds normal. No respiratory distress. She has no wheezes. She has no rales.  Breast: no palpable mass in left breast, left breast non tender, no chest wall tenderness Abdominal: Soft. She exhibits no distension. There is no tenderness.  Lymphadenopathy: She has no cervical  adenopathy.  Skin: Skin is warm and dry. She is not diaphoretic.  Psychiatric: She has a normal mood and affect. Her behavior is normal.         Assessment & Plan:   Physical exam: Screening blood work  ordered Immunizations  Up to date  Colonoscopy   Up to date  Mammogram  Up to date  Gyn   Up to date  - Dr [facility]  Up to date  EKG - done  03/2015 - repeat today given abnormality -- Sinus  Bradycardia at 55bpm -Nonspecific ST depression and Negative precordial T-waves  -- no change compared to prior EKG from 2017 Exercise - twice a week boot camp Weight - dadiadvised sed weight loss Skin no concerns Substance abuse  none  See Problem List for Assessment and Plan of chronic medical problems.  FU in one year

## 2016-04-27 NOTE — Assessment & Plan Note (Signed)
BP high today, but has been controlled She will monitor regularly at home She will work on weight loss Current regimen effective and well tolerated Continue current medications at current doses cmp

## 2016-04-27 NOTE — Assessment & Plan Note (Signed)
Check a1c 

## 2016-04-27 NOTE — Patient Instructions (Addendum)
Test(s) ordered today. Your results will be released to [facility] (or called to you) after review, usually within 72hours after test completion. If any changes need to be made, you will be notified at that same time.  All other Health Maintenance issues reviewed.   All recommended immunizations and age-appropriate screenings are up-to-date or discussed.  No immunizations administered today.  An EKG was done today.  Medications reviewed and updated.  No changes recommended at this time.   Please followup in one year for a physical - sooner if your BP is elevated at home   Health Maintenance, Female Adopting a healthy lifestyle and getting preventive care can go a long way to promote health and wellness. Talk with your health care provider about what schedule of regular examinations is right for you. This is a good chance for you to check in with your provider about disease prevention and staying healthy. In between checkups, there are plenty of things you can do on your own. Experts have done a lot of research about which lifestyle changes and preventive measures are most likely to keep you healthy. Ask your health care provider for more information. Weight and diet Eat a healthy diet  Be sure to include plenty of vegetables, fruits, low-fat dairy products, and lean protein.  Do not eat a lot of foods high in solid fats, added sugars, or salt.  Get regular exercise. This is one of the most important things you can do for your health.  Most adults should exercise for at least 150 minutes each week. The exercise should increase your heart rate and make you sweat (moderate-intensity exercise).  Most adults should also do strengthening exercises at least twice a week. This is in addition to the moderate-intensity exercise. Maintain a healthy weight  Body mass index (BMI) is a measurement that can be used to identify possible weight problems. It estimates body fat based on height and weight.  Your health care provider can help determine your BMI and help you achieve or maintain a healthy weight.  For females 40 years of age and older:  A BMI below 18.5 is considered underweight.  A BMI of 18.5 to 24.9 is normal.  A BMI of 25 to 29.9 is considered overweight.  A BMI of 30 and above is considered obese. Watch levels of cholesterol and blood lipids  You should start having your blood tested for lipids and cholesterol at 57 years of age, then have this test every 5 years.  You may need to have your cholesterol levels checked more often if:  Your lipid or cholesterol levels are high.  You are older than 57 years of age.  You are at high risk for heart disease. Cancer screening Lung Cancer  Lung cancer screening is recommended for adults 77-28 years old who are at high risk for lung cancer because of a history of smoking.  A yearly low-dose CT scan of the lungs is recommended for people who:  Currently smoke.  Have quit within the past 15 years.  Have at least a 30-pack-year history of smoking. A pack year is smoking an average of one pack of cigarettes a day for 1 year.  Yearly screening should continue until it has been 15 years since you quit.  Yearly screening should stop if you develop a health problem that would prevent you from having lung cancer treatment. Breast Cancer  Practice breast self-awareness. This means understanding how your breasts normally appear and feel.  It also means  doing regular breast self-exams. Let your health care provider know about any changes, no matter how small.  If you are in your 20s or 30s, you should have a clinical breast exam (CBE) by a health care provider every 1-3 years as part of a regular health exam.  If you are 80 or older, have a CBE every year. Also consider having a breast X-ray (mammogram) every year.  If you have a family history of breast cancer, talk to your health care provider about genetic  screening.  If you are at high risk for breast cancer, talk to your health care provider about having an MRI and a mammogram every year.  Breast cancer gene (BRCA) assessment is recommended for women who have family members with BRCA-related cancers. BRCA-related cancers include:  Breast.  Ovarian.  Tubal.  Peritoneal cancers.  Results of the assessment will determine the need for genetic counseling and BRCA1 and BRCA2 testing. Cervical Cancer  Your health care provider may recommend that you be screened regularly for cancer of the pelvic organs (ovaries, uterus, and vagina). This screening involves a pelvic examination, including checking for microscopic changes to the surface of your cervix (Pap test). You may be encouraged to have this screening done every 3 years, beginning at age 36.  For women ages 51-65, health care providers may recommend pelvic exams and Pap testing every 3 years, or they may recommend the Pap and pelvic exam, combined with testing for human papilloma virus (HPV), every 5 years. Some types of HPV increase your risk of cervical cancer. Testing for HPV may also be done on women of any age with unclear Pap test results.  Other health care providers may not recommend any screening for nonpregnant women who are considered low risk for pelvic cancer and who do not have symptoms. Ask your health care provider if a screening pelvic exam is right for you.  If you have had past treatment for cervical cancer or a condition that could lead to cancer, you need Pap tests and screening for cancer for at least 20 years after your treatment. If Pap tests have been discontinued, your risk factors (such as having a new sexual partner) need to be reassessed to determine if screening should resume. Some women have medical problems that increase the chance of getting cervical cancer. In these cases, your health care provider may recommend more frequent screening and Pap tests. Colorectal  Cancer  This type of cancer can be detected and often prevented.  Routine colorectal cancer screening usually begins at 57 years of age and continues through 57 years of age.  Your health care provider may recommend screening at an earlier age if you have risk factors for colon cancer.  Your health care provider may also recommend using home test kits to check for hidden blood in the stool.  A small camera at the end of a tube can be used to examine your colon directly (sigmoidoscopy or colonoscopy). This is done to check for the earliest forms of colorectal cancer.  Routine screening usually begins at age 10.  Direct examination of the colon should be repeated every 5-10 years through 57 years of age. However, you may need to be screened more often if early forms of precancerous polyps or small growths are found. Skin Cancer  Check your skin from head to toe regularly.  Tell your health care provider about any new moles or changes in moles, especially if there is a change in a mole's shape  or color.  Also tell your health care provider if you have a mole that is larger than the size of a pencil eraser.  Always use sunscreen. Apply sunscreen liberally and repeatedly throughout the day.  Protect yourself by wearing long sleeves, pants, a wide-brimmed hat, and sunglasses whenever you are outside. Heart disease, diabetes, and high blood pressure  High blood pressure causes heart disease and increases the risk of stroke. High blood pressure is more likely to develop in:  People who have blood pressure in the high end of the normal range (130-139/85-89 mm Hg).  People who are overweight or obese.  People who are African American.  If you are 66-5 years of age, have your blood pressure checked every 3-5 years. If you are 66 years of age or older, have your blood pressure checked every year. You should have your blood pressure measured twice-once when you are at a hospital or clinic,  and once when you are not at a hospital or clinic. Record the average of the two measurements. To check your blood pressure when you are not at a hospital or clinic, you can use:  An automated blood pressure machine at a pharmacy.  A home blood pressure monitor.  If you are between 63 years and 49 years old, ask your health care provider if you should take aspirin to prevent strokes.  Have regular diabetes screenings. This involves taking a blood sample to check your fasting blood sugar level.  If you are at a normal weight and have a low risk for diabetes, have this test once every three years after 57 years of age.  If you are overweight and have a high risk for diabetes, consider being tested at a younger age or more often. Preventing infection Hepatitis B  If you have a higher risk for hepatitis B, you should be screened for this virus. You are considered at high risk for hepatitis B if:  You were born in a country where hepatitis B is common. Ask your health care provider which countries are considered high risk.  Your parents were born in a high-risk country, and you have not been immunized against hepatitis B (hepatitis B vaccine).  You have HIV or AIDS.  You use needles to inject street drugs.  You live with someone who has hepatitis B.  You have had sex with someone who has hepatitis B.  You get hemodialysis treatment.  You take certain medicines for conditions, including cancer, organ transplantation, and autoimmune conditions. Hepatitis C  Blood testing is recommended for:  Everyone born from [birth year] through 1966.  Anyone with known risk factors for hepatitis C. Sexually transmitted infections (STIs)  You should be screened for sexually transmitted infections (STIs) including gonorrhea and chlamydia if:  You are sexually active and are younger than 57 years of age.  You are older than 57 years of age and your health care provider tells you that you are at risk  for this type of infection.  Your sexual activity has changed since you were last screened and you are at an increased risk for chlamydia or gonorrhea. Ask your health care provider if you are at risk.  If you do not have HIV, but are at risk, it may be recommended that you take a prescription medicine daily to prevent HIV infection. This is called pre-exposure prophylaxis (PrEP). You are considered at risk if:  You are sexually active and do not regularly use condoms or know the HIV status of  your partner(s).  You take drugs by injection.  You are sexually active with a partner who has HIV. Talk with your health care provider about whether you are at high risk of being infected with HIV. If you choose to begin PrEP, you should first be tested for HIV. You should then be tested every 3 months for as long as you are taking PrEP. Pregnancy  If you are premenopausal and you may become pregnant, ask your health care provider about preconception counseling.  If you may become pregnant, take 400 to 800 micrograms (mcg) of folic acid every day.  If you want to prevent pregnancy, talk to your health care provider about birth control (contraception). Osteoporosis and menopause  Osteoporosis is a disease in which the bones lose minerals and strength with aging. This can result in serious bone fractures. Your risk for osteoporosis can be identified using a bone density scan.  If you are 39 years of age or older, or if you are at risk for osteoporosis and fractures, ask your health care provider if you should be screened.  Ask your health care provider whether you should take a calcium or vitamin D supplement to lower your risk for osteoporosis.  Menopause may have certain physical symptoms and risks.  Hormone replacement therapy may reduce some of these symptoms and risks. Talk to your health care provider about whether hormone replacement therapy is right for you. Follow these instructions at  home:  Schedule regular health, dental, and eye exams.  Stay current with your immunizations.  Do not use any tobacco products including cigarettes, chewing tobacco, or electronic cigarettes.  If you are pregnant, do not drink alcohol.  If you are breastfeeding, limit how much and how often you drink alcohol.  Limit alcohol intake to no more than 1 drink per day for nonpregnant women. One drink equals 12 ounces of beer, 5 ounces of wine, or 1 ounces of hard liquor.  Do not use street drugs.  Do not share needles.  Ask your health care provider for help if you need support or information about quitting drugs.  Tell your health care provider if you often feel depressed.  Tell your health care provider if you have ever been abused or do not feel safe at home. This information is not intended to replace advice given to you by your health care provider. Make sure you discuss any questions you have with your health care provider. Document Released: 08/15/2010 Document Revised: 07/08/2015 Document Reviewed: 11/03/2014 Elsevier Interactive Patient Education  2017 [employer].

## 2016-04-27 NOTE — Assessment & Plan Note (Signed)
Normal exam Discussed getting a diagnostic mammo, but she deferred - she will monitor for now and if pain persists advised her to call and I can order a mammo

## 2016-04-29 ENCOUNTER — Encounter: Payer: Self-pay | Admitting: Internal Medicine

## 2016-04-29 DIAGNOSIS — R7303 Prediabetes: Secondary | ICD-10-CM | POA: Insufficient documentation

## 2016-05-02 DIAGNOSIS — N760 Acute vaginitis: Secondary | ICD-10-CM | POA: Diagnosis not present

## 2016-05-02 DIAGNOSIS — N951 Menopausal and female climacteric states: Secondary | ICD-10-CM | POA: Diagnosis not present

## 2016-05-02 DIAGNOSIS — Z01419 Encounter for gynecological examination (general) (routine) without abnormal findings: Secondary | ICD-10-CM | POA: Diagnosis not present

## 2016-05-08 ENCOUNTER — Encounter (HOSPITAL_COMMUNITY): Payer: Self-pay | Admitting: Emergency Medicine

## 2016-05-08 ENCOUNTER — Telehealth: Payer: Self-pay | Admitting: Adult Health

## 2016-05-08 ENCOUNTER — Ambulatory Visit (HOSPITAL_COMMUNITY)
Admission: EM | Admit: 2016-05-08 | Discharge: 2016-05-08 | Disposition: A | Discharge status: Home / Self Care | Payer: 59 | Attending: Internal Medicine | Admitting: Internal Medicine

## 2016-05-08 DIAGNOSIS — S39012A Strain of muscle, fascia and tendon of lower back, initial encounter: Secondary | ICD-10-CM

## 2016-05-08 DIAGNOSIS — M5440 Lumbago with sciatica, unspecified side: Secondary | ICD-10-CM

## 2016-05-08 DIAGNOSIS — Y9329 Activity, other involving ice and snow: Secondary | ICD-10-CM

## 2016-05-08 MED ORDER — KETOROLAC TROMETHAMINE 30 MG/ML IJ SOLN
30.0000 mg | Freq: Once | INTRAMUSCULAR | Status: AC
  Administered 2016-05-08: 30 mg via INTRAMUSCULAR

## 2016-05-08 MED ORDER — METHYLPREDNISOLONE ACETATE 80 MG/ML IJ SUSP
80.0000 mg | Freq: Once | INTRAMUSCULAR | Status: AC
  Administered 2016-05-08: 80 mg via INTRAMUSCULAR

## 2016-05-08 MED ORDER — METHYLPREDNISOLONE ACETATE 80 MG/ML IJ SUSP
INTRAMUSCULAR | Status: AC
  Filled 2016-05-08: qty 1

## 2016-05-08 MED ORDER — DICLOFENAC SODIUM 75 MG PO TBEC: 75.0000 mg | DELAYED_RELEASE_TABLET | Freq: Two times a day (BID) | ORAL | 0 refills | Status: DC

## 2016-05-08 MED ORDER — KETOROLAC TROMETHAMINE 30 MG/ML IJ SOLN
INTRAMUSCULAR | Status: AC
  Filled 2016-05-08: qty 1

## 2016-05-08 MED ORDER — METHOCARBAMOL 500 MG PO TABS: 500.0000 mg | ORAL_TABLET | Freq: Two times a day (BID) | ORAL | 0 refills | Status: DC

## 2016-05-08 NOTE — Progress Notes (Signed)
Based on what you shared with me it looks like you have a serious condition that should be evaluated in a face to face office visit.  NOTE: Even if you have entered your credit card information for this eVisit, you will not be charged.   If you are having a true medical emergency please call 911.  If you need an urgent face to face visit, [facility] has four urgent care centers for your convenience.  If you need care fast and have a high deductible or no insurance consider:   [email or web address]  [phone]  [address], [address] [place], [place] [address] 8 am to 8 pm Monday-Friday 10 am to 4 pm Saturday-Sunday   The following sites will take your  insurance:    . [facility]  [phone] Get Driving Directions Find a Provider at this Location  [address] [place], [place] [address] . 10 am to 8 pm Monday-Friday . 12 pm to 8 pm Saturday-Sunday   . [facility]  [phone] Get Driving Directions Find a Provider at this Location  [address], [address] [place], [place] [address] . 8 am to 8 pm Monday-Friday . 9 am to 6 pm Saturday . 11 am to 6 pm Sunday   . [facility] [place]  [phone] Get Driving Directions  [address] [place], [place] [address] . 8 am to 8 pm Monday-Friday . 8 am to 4 pm Saturday-Sunday   Your e-visit answers were reviewed by a board certified advanced clinical practitioner to complete your personal care plan.  Thank you for using e-Visits.  

## 2016-05-08 NOTE — Discharge Instructions (Signed)
You most likely have a strained muscle in your lower back. I have prescribed two medicines for your pain. The first is diclofenac, take 1 tablet twice a day and the other is Robaxin, take 1 tablet twice a day. Robaxin may cause drowsiness so do not drive until you know how this medicine affects you. Also do not drink any alcohol either. You may apply ice and alternate with heat for 15 minutes at a time 4 times daily and for additional pain control you may take tylenol over the counter ever 4 hours but do not take more than 4000 mg a day. Should your pain continue or fail to resolve, follow up with your primary care provider or return to clinic as needed.  °

## 2016-05-08 NOTE — ED Provider Notes (Signed)
CSN: [id number]     Arrival date & time 05/08/16  1002 History   None    Chief Complaint  Patient presents with  . Back Pain   (Consider location/radiation/quality/duration/timing/severity/associated sxs/prior Treatment) 56 year old female presents to clinic for evaluation of lower back pain radiating to the left leg. Denies any traumatic cause, however states she was shoveling significanct amounts of snow yesterday.   The history is provided by the patient.  Back Pain  Quality:  Aching, cramping and shooting Radiates to:  L foot Pain severity:  Severe Pain is:  Same all the time Onset quality:  Gradual Timing:  Constant Progression:  Worsening Chronicity:  New Context: lifting heavy objects (snow clearance ) and twisting   Context: not emotional stress, not falling, not MCA, not MVA, not occupational injury, not physical stress and not recent illness   Relieved by:  Being still and NSAIDs Worsened by:  Ambulation, bending, lying down and movement Ineffective treatments:  Bed rest and lying down Associated symptoms: leg pain and tingling   Associated symptoms: no abdominal pain, no abdominal swelling, no bladder incontinence, no bowel incontinence, no chest pain, no dysuria, no fever, no headaches, no numbness, no paresthesias, no weakness and no weight loss   Risk factors: menopause   Risk factors: not obese, not pregnant and no recent surgery     Past Medical History:  Diagnosis Date  . Anemia    PMH of  . Cervical spinal stenosis    Dr Arnoldo Morale, [place]  . Hypertension   . Internal hemorrhoids   . Migraines    menstrual  . Nonspecific ST-T changes 2007  . Normal nuclear stress test 2007   Past Surgical History:  Procedure Laterality Date  . COLONOSCOPY  2012   neg; [facility]  . CONCUSSION     DUE TO FALL AT AGE 33  . FOOT SURGERY     Right   . LUMBAR LAMINECTOMY  2003   FOR RUPTURED DISC  . ORIF CLAVICULAR FRACTURE     bilaterally @ age 64  . TONSILLECTOMY AND  ADENOIDECTOMY     Family History  Problem Relation Age of Onset  . Hypertension Mother     bilateral endarterectomies,pacer,mesenteric artery bypass, renal artery stenosis stented unilaterally, intestinal perforation, steroids for colitis induced osteoprosis, polyps   . Cancer Mother     uterine S/P Tamoxifen  . Transient ischemic attack Mother     in early 68's  . Colon polyps Mother   . Irritable bowel syndrome Mother   . Breast cancer Mother   . Heart disease Mother     pacer  . Asthma Father   . Hypertension Father   . Colon polyps Father   . Heart disease Father     S/P ablation for AF  . Stroke Father     cns bleed on coumadin  . Hypertension Sister     2 sisters  . Cancer Maternal Grandmother     uterine  . Diabetes Maternal Grandmother   . Cancer Sister     cervical  . Colon cancer Neg Hx    Social History  Substance Use Topics  . Smoking status: Never Smoker  . Smokeless tobacco: Never Used  . Alcohol use No   OB History    Gravida Para Term Preterm AB Living   2 2           SAB TAB Ectopic Multiple Live Births  Obstetric Comments   C-Section x1     Review of Systems  Reason unable to perform ROS: as covered in HPI.  Constitutional: Negative for fever and weight loss.  Cardiovascular: Negative for chest pain.  Gastrointestinal: Negative for abdominal pain and bowel incontinence.  Genitourinary: Negative for bladder incontinence and dysuria.  Musculoskeletal: Positive for back pain.  Neurological: Positive for tingling. Negative for weakness, numbness, headaches and paresthesias.  All other systems reviewed and are negative.   Allergies  Rofecoxib and Coreg [carvedilol]  Home Medications   Prior to Admission medications   Medication Sig Start Date End Date Taking? Authorizing Provider  aspirin 81 MG tablet Take 81 mg by mouth daily.   Yes Historical Provider, MD  cholecalciferol (VITAMIN D) 1000 units tablet Take 2,000 Units by  mouth daily.   Yes Historical Provider, MD  ibuprofen (ADVIL,MOTRIN) 800 MG tablet Take 800 mg by mouth every 8 (eight) hours as needed.   Yes Historical Provider, MD  losartan-hydrochlorothiazide (HYZAAR) 100-12.5 MG tablet Take 1 tablet by mouth daily. 04/25/16  Yes Binnie Rail, MD  Multiple Vitamins-Minerals (MULTIVITAMIN) tablet Take 1 tablet by mouth daily. 04/27/16  Yes Binnie Rail, MD  diclofenac (VOLTAREN) 75 MG EC tablet Take 1 tablet (75 mg total) by mouth 2 (two) times daily. 05/08/16   Barnet Glasgow, NP  methocarbamol (ROBAXIN) 500 MG tablet Take 1 tablet (500 mg total) by mouth 2 (two) times daily. 05/08/16   Barnet Glasgow, NP   Meds Ordered and Administered this Visit   Medications  ketorolac (TORADOL) 30 MG/ML injection 30 mg (30 mg Intramuscular Given 05/08/16 1123)  methylPREDNISolone acetate (DEPO-MEDROL) injection 80 mg (80 mg Intramuscular Given 05/08/16 1123)    BP (!) 177/90 (BP Location: Left Arm)   Pulse 70   Temp 97.6 F (36.4 C) (Oral)   LMP 02/14/2012   SpO2 100%  No data found.   Physical Exam  Constitutional: She is oriented to person, place, and time. She appears well-developed and well-nourished. No distress.  HENT:  Head: Normocephalic and atraumatic.  Neck: Normal range of motion.  Cardiovascular: Normal rate and regular rhythm.   Pulmonary/Chest: Effort normal and breath sounds normal.  Abdominal: Soft. Bowel sounds are normal. She exhibits no distension. There is no tenderness.  Musculoskeletal:       Lumbar back: She exhibits tenderness and pain. She exhibits normal range of motion, no bony tenderness, no swelling, no spasm and normal pulse.       Back:  Negative straight leg test  Neurological: She is alert and oriented to person, place, and time.  Skin: Skin is warm and dry. Capillary refill takes less than 2 seconds. She is not diaphoretic.  Psychiatric: She has a normal mood and affect. Her behavior is normal.  Nursing note and vitals  reviewed.   Urgent Care Course     Procedures (including critical care time)  Labs Review Labs Reviewed - No data to display  Imaging Review No results found.     MDM   1. Strain of lumbar region, initial encounter    Received an injection of Toradol, and Depo-Medrol clinic today, discharged home with prescriptions for diclofenac, and Robaxin. Advised follow-up with primary care in 1-2 weeks if symptoms persist. Advised to go to the emergency room at any time if loss of sensation in the extremities, loss of control of bowel or bladder     Barnet Glasgow, NP 05/08/16 1131

## 2016-05-08 NOTE — ED Triage Notes (Signed)
Pt was shoveling snow yesterday when she felt a pull in her back.  It wasn't until later that she felt the pain in her right buttocks, hip, her posterior thigh and all the way down to the foot.

## 2016-05-15 ENCOUNTER — Ambulatory Visit (INDEPENDENT_AMBULATORY_CARE_PROVIDER_SITE_OTHER): Payer: 59 | Admitting: Internal Medicine

## 2016-05-15 ENCOUNTER — Encounter: Payer: Self-pay | Admitting: Internal Medicine

## 2016-05-15 VITALS — BP 170/96 | HR 70 | Temp 98.0°F | Resp 16 | Wt 182.0 lb

## 2016-05-15 DIAGNOSIS — M5416 Radiculopathy, lumbar region: Secondary | ICD-10-CM | POA: Diagnosis not present

## 2016-05-15 DIAGNOSIS — I10 Essential (primary) hypertension: Secondary | ICD-10-CM

## 2016-05-15 MED ORDER — GABAPENTIN 100 MG PO CAPS: ORAL_CAPSULE | ORAL | 3 refills | Status: DC

## 2016-05-15 MED ORDER — DIAZEPAM 5 MG PO TABS: ORAL_TABLET | ORAL | 0 refills | Status: DC

## 2016-05-15 NOTE — Progress Notes (Signed)
Pre visit review using our clinic review tool, if applicable. No additional management support is needed unless otherwise documented below in the visit note. 

## 2016-05-15 NOTE — Patient Instructions (Addendum)
An MRI of your back was ordered.    Medications reviewed and updated.  Changes include taking gabapentin at night.   Your prescription(s) have been submitted to your pharmacy. Please take as directed and contact our office if you believe you are having problem(s) with the medication(s).  A referral was ordered for Dr Arnoldo Morale

## 2016-05-15 NOTE — Assessment & Plan Note (Signed)
BP elevated here and at urgent care - likely related to severe pain She will start monitoring at home - if remains elevated will need to start another medication temporarily Continue current medication for now

## 2016-05-15 NOTE — Progress Notes (Signed)
Subjective:    Patient ID: Whitney Blake, female    DOB: 1960-07-07, 56 y.o.   MRN: [id number]  HPI The patient is here for follow up of back pain.  She went to the urgent care 05/08/16 for back pain.  The pain was in her right leg.  She did not have any back pain.  She did have numbness/ tingling in her right leg.  She did a lot of shoveling of snow the day prior and felt a pull, but denies other injuries or accidents.  Her pain was worse with bending and laying down.  She received Toradol 30 mg IM and Depo-Medrol 80 mg IM in the ED.  She was discharged home with prescriptions for diclofenac and robaxin.  She has been taking them as prescribed, but just stopped due to stomach upset/pain and they have not helped.   Pain is 6-7/10.  She has pain in her right lower leg and it is numb to touch.  She has pain and numbness in her foot. Her upper leg is achy.  She denies lower back pain.  Her pain is constant.  Walking around for a while improves it.  Laying down is very painful.    She had back surgery 18 years ago and had similar symptoms  Her BP was elevated here today and at urgent care.  She is taking her medication daily.    Medications and allergies reviewed with patient and updated if appropriate.  Patient Active Problem List   Diagnosis Date Noted  . Prediabetes 04/29/2016  . Breast pain, left 04/27/2016  . Hyperglycemia 04/27/2016  . Leg pain, bilateral 04/23/2015  . Anxiety state 04/23/2015  . Nephrolithiasis 09/06/2012  . Essential hypertension 12/07/2011  . HOT FLASHES 02/03/2010  . Sleep disorder 05/25/2009  . Migraine 12/21/2008  . Postmenopausal 12/21/2008  . Anemia 09/03/2007  . NONSPECIFIC ABNORMAL ELECTROCARDIOGRAM 09/03/2007  . SPINAL STENOSIS 06/14/2006    Current Outpatient Prescriptions on File Prior to Visit  Medication Sig Dispense Refill  . aspirin 81 MG tablet Take 81 mg by mouth daily.    . cholecalciferol (VITAMIN D) 1000 units tablet Take 2,000 Units by  mouth daily.    [name] ibuprofen (ADVIL,MOTRIN) 800 MG tablet Take 800 mg by mouth every 8 (eight) hours as needed.    [name] losartan-hydrochlorothiazide (HYZAAR) 100-12.5 MG tablet Take 1 tablet by mouth daily. 90 tablet 1  . Multiple Vitamins-Minerals (MULTIVITAMIN) tablet Take 1 tablet by mouth daily.     No current facility-administered medications on file prior to visit.     Past Medical History:  Diagnosis Date  . Anemia    PMH of  . Cervical spinal stenosis    Dr Arnoldo Morale, [place]  . Hypertension   . Internal hemorrhoids   . Migraines    menstrual  . Nonspecific ST-T changes 2007  . Normal nuclear stress test 2007    Past Surgical History:  Procedure Laterality Date  . COLONOSCOPY  2012   neg; [facility]  . CONCUSSION     DUE TO FALL AT AGE 34  . FOOT SURGERY     Right   . LUMBAR LAMINECTOMY  2003   FOR RUPTURED DISC  . ORIF CLAVICULAR FRACTURE     bilaterally @ age 2  . TONSILLECTOMY AND ADENOIDECTOMY      Social History   Social History  . Marital status: Married    Spouse name: N/A  . Number of children: 2  . Years  of education: N/A   Occupational History  . Computer Programmer [employer]   Social History Main Topics  . Smoking status: Never Smoker  . Smokeless tobacco: Never Used  . Alcohol use No  . Drug use: No  . Sexual activity: Not Asked   Other Topics Concern  . None   Social History Narrative   Exercises twice a week - boot camp   Daily caffeine     Family History  Problem Relation Age of Onset  . Hypertension Mother     bilateral endarterectomies,pacer,mesenteric artery bypass, renal artery stenosis stented unilaterally, intestinal perforation, steroids for colitis induced osteoprosis, polyps   . Cancer Mother     uterine S/P Tamoxifen  . Transient ischemic attack Mother     in early 30's  . Colon polyps Mother   . Irritable bowel syndrome Mother   . Breast cancer Mother   . Heart disease Mother     pacer  . Asthma Father   .  Hypertension Father   . Colon polyps Father   . Heart disease Father     S/P ablation for AF  . Stroke Father     cns bleed on coumadin  . Hypertension Sister     2 sisters  . Cancer Maternal Grandmother     uterine  . Diabetes Maternal Grandmother   . Cancer Sister     cervical  . Colon cancer Neg Hx     Review of Systems  Constitutional: Negative for chills and fever.  Gastrointestinal: Positive for abdominal pain and diarrhea. Negative for nausea.       No gerd; no fecal incontinence  Genitourinary:       No urinary incontinence  Musculoskeletal: Positive for myalgias (cramping of right calf and foot). Negative for back pain.  Neurological: Positive for weakness (right leg) and numbness (right leg).       No anal numbness/tingling       Objective:   Vitals:   05/15/16 1604  BP: (!) 170/96  Pulse: 70  Resp: 16  Temp: 98 F (36.7 C)   Wt Readings from Last 3 Encounters:  05/15/16 182 lb (82.6 kg)  04/27/16 181 lb (82.1 kg)  02/15/16 179 lb (81.2 kg)   Body mass index is 29.38 kg/m.   Physical Exam  Constitutional: She appears well-developed and well-nourished. No distress.  Musculoskeletal: She exhibits no edema.  No lower back pain with palpation, no lumbar spinal deformity or tenderness  Neurological:  Gait with a limp, decreased sensation lateral right calf and foot, mild weakness in right leg compared to left  Skin: She is not diaphoretic.            Assessment & Plan:    See Problem List for Assessment and Plan of chronic medical problems.

## 2016-05-15 NOTE — Assessment & Plan Note (Signed)
Started over one week ago - ? Improvement in back pain - currently she has no back pain and thinks she had it originally. Weakness, pain and numbness in right leg/foot Concern for herniated disc, especially with her history of herniated disc requiring surgery No incontinence, anal numbness Stat MRI Stat referral to neurosurgery Gabapentin at bedtime - increase as tolerated Deferred narcotics at this time

## 2016-05-17 ENCOUNTER — Ambulatory Visit
Admission: RE | Admit: 2016-05-17 | Discharge: 2016-05-17 | Disposition: A | Discharge status: Home / Self Care | Payer: 59 | Source: Ambulatory Visit | Attending: Internal Medicine | Admitting: Internal Medicine

## 2016-05-17 DIAGNOSIS — M5126 Other intervertebral disc displacement, lumbar region: Secondary | ICD-10-CM | POA: Diagnosis not present

## 2016-05-17 DIAGNOSIS — M5416 Radiculopathy, lumbar region: Secondary | ICD-10-CM

## 2016-05-18 ENCOUNTER — Telehealth: Payer: Self-pay | Admitting: Internal Medicine

## 2016-05-18 NOTE — Telephone Encounter (Signed)
Patient called office back about her mri results. I informed her of notes. She sates she does not have an appointment yet with the surgeon she is still waiting on a call. She would like to know her next step. Thank you.

## 2016-05-18 NOTE — Telephone Encounter (Signed)
Spoke with pt to inform that documents were sent to [facility] and they will be contacting her to schedule appt.

## 2016-05-19 ENCOUNTER — Telehealth: Payer: Self-pay | Admitting: Internal Medicine

## 2016-05-19 NOTE — Telephone Encounter (Signed)
Pt called regarding the recent STAT MRI that she had done. She said that they told her that the disk is pressing on a nerve which is causing the pain. She was told that the Neurosurgeon would be in contact with her but she never heard anything from them. She called their office this morning to follow up with them but they said that the Neurosurgeon that she was referred to does not have an appointment until April 24th and is out of the office until Wednesday. They also did not have it as a STAT result. She asked them if the on call doctor could review it because she does not think that it can wait that long. She wanted to know if there was anything that Dr Quay Burow could do to move the process along? Please advise.

## 2016-05-19 NOTE — Telephone Encounter (Signed)
Spoke with pt to inform. She is going to try to call Dr Arnoldo Morale office once he returns.

## 2016-05-19 NOTE — Telephone Encounter (Signed)
We sent the referral over to them 4/3 - we can not influence how quickly they get her in.  The referral was stat and the MRI was stat - that is why they were processed so quickly.    Dr Arnoldo Morale will probably review the  MRI on Wednesday and determine if she needs to be seen sooner. Or we can refer her to someone else.  Their office needs to triage to determine how quickly she needs to be seen

## 2016-05-19 NOTE — Telephone Encounter (Signed)
Please advise 

## 2016-05-23 DIAGNOSIS — M5126 Other intervertebral disc displacement, lumbar region: Secondary | ICD-10-CM | POA: Diagnosis not present

## 2016-05-23 DIAGNOSIS — M5416 Radiculopathy, lumbar region: Secondary | ICD-10-CM | POA: Diagnosis not present

## 2016-06-12 DIAGNOSIS — M5116 Intervertebral disc disorders with radiculopathy, lumbar region: Secondary | ICD-10-CM | POA: Diagnosis not present

## 2016-06-12 DIAGNOSIS — M5126 Other intervertebral disc displacement, lumbar region: Secondary | ICD-10-CM | POA: Diagnosis not present

## 2016-09-04 ENCOUNTER — Other Ambulatory Visit: Payer: Self-pay | Admitting: Obstetrics and Gynecology

## 2016-09-04 DIAGNOSIS — Z1231 Encounter for screening mammogram for malignant neoplasm of breast: Secondary | ICD-10-CM

## 2016-09-11 ENCOUNTER — Ambulatory Visit
Admission: RE | Admit: 2016-09-11 | Discharge: 2016-09-11 | Disposition: A | Discharge status: Home / Self Care | Payer: 59 | Source: Ambulatory Visit | Attending: Obstetrics and Gynecology | Admitting: Obstetrics and Gynecology

## 2016-09-11 DIAGNOSIS — Z1231 Encounter for screening mammogram for malignant neoplasm of breast: Secondary | ICD-10-CM | POA: Diagnosis not present

## 2016-10-18 ENCOUNTER — Encounter: Payer: Self-pay | Admitting: Internal Medicine

## 2016-10-18 DIAGNOSIS — K6289 Other specified diseases of anus and rectum: Secondary | ICD-10-CM

## 2016-11-20 ENCOUNTER — Other Ambulatory Visit: Payer: Self-pay | Admitting: Internal Medicine

## 2016-12-13 DIAGNOSIS — I10 Essential (primary) hypertension: Secondary | ICD-10-CM | POA: Diagnosis not present

## 2016-12-13 DIAGNOSIS — M5126 Other intervertebral disc displacement, lumbar region: Secondary | ICD-10-CM | POA: Diagnosis not present

## 2016-12-13 DIAGNOSIS — Z23 Encounter for immunization: Secondary | ICD-10-CM | POA: Diagnosis not present

## 2016-12-19 ENCOUNTER — Encounter: Payer: Self-pay | Admitting: Gastroenterology

## 2016-12-19 ENCOUNTER — Ambulatory Visit: Payer: 59 | Admitting: Gastroenterology

## 2016-12-19 ENCOUNTER — Other Ambulatory Visit (INDEPENDENT_AMBULATORY_CARE_PROVIDER_SITE_OTHER): Payer: 59

## 2016-12-19 VITALS — BP 110/80 | HR 72 | Ht 66.0 in | Wt 176.0 lb

## 2016-12-19 DIAGNOSIS — L309 Dermatitis, unspecified: Secondary | ICD-10-CM

## 2016-12-19 DIAGNOSIS — R197 Diarrhea, unspecified: Secondary | ICD-10-CM

## 2016-12-19 LAB — CBC WITH DIFFERENTIAL/PLATELET
Basophils Absolute: 0.1 K/uL (ref 0.0–0.1)
Basophils Relative: 1.1 % (ref 0.0–3.0)
Eosinophils Absolute: 0.2 K/uL (ref 0.0–0.7)
Eosinophils Relative: 2.2 % (ref 0.0–5.0)
HCT: 42.2 % (ref 36.0–46.0)
Hemoglobin: 13.8 g/dL (ref 12.0–15.0)
Lymphocytes Relative: 42.5 % (ref 12.0–46.0)
Lymphs Abs: 3.5 K/uL (ref 0.7–4.0)
MCHC: 32.8 g/dL (ref 30.0–36.0)
MCV: 88.5 fl (ref 78.0–100.0)
Monocytes Absolute: 0.6 K/uL (ref 0.1–1.0)
Monocytes Relative: 7.2 % (ref 3.0–12.0)
Neutro Abs: 3.8 K/uL (ref 1.4–7.7)
Neutrophils Relative %: 47 % (ref 43.0–77.0)
Platelets: 276 K/uL (ref 150.0–400.0)
RBC: 4.77 Mil/uL (ref 3.87–5.11)
RDW: 14.1 % (ref 11.5–15.5)
WBC: 8.1 K/uL (ref 4.0–10.5)

## 2016-12-19 LAB — BASIC METABOLIC PANEL
BUN: 13 mg/dL (ref 6–23)
CHLORIDE: 102 meq/L (ref 96–112)
CO2: 31 mEq/L (ref 19–32)
Calcium: 9.5 mg/dL (ref 8.4–10.5)
Creatinine, Ser: 0.93 mg/dL (ref 0.40–1.20)
GFR: 65.95 mL/min (ref >60.00)
GLUCOSE: 82 mg/dL (ref 70–99)
POTASSIUM: 3.6 meq/L (ref 3.5–5.1)
SODIUM: 140 meq/L (ref 135–145)

## 2016-12-19 LAB — HEPATIC FUNCTION PANEL
ALT: 21 U/L (ref 0–35)
AST: 17 U/L (ref 0–37)
Albumin: 4.2 g/dL (ref 3.5–5.2)
Alkaline Phosphatase: 50 U/L (ref 39–117)
Bilirubin, Direct: 0 mg/dL (ref 0.0–0.3)
Total Bilirubin: 0.3 mg/dL (ref 0.2–1.2)
Total Protein: 7.1 g/dL (ref 6.0–8.3)

## 2016-12-19 LAB — TSH: TSH: 2.24 u[IU]/mL (ref 0.35–4.50)

## 2016-12-19 MED ORDER — CLOTRIMAZOLE 1 % EX CREA: 1.0000 "application " | TOPICAL_CREAM | Freq: Two times a day (BID) | CUTANEOUS | 0 refills | Status: AC

## 2016-12-19 NOTE — Progress Notes (Signed)
History of Present Illness: This is a 56 year old referred by Binnie Rail, MD for the evaluation of mild diarrhea and pain in perineum.  She relates worsening burning pain in her perineum for about 4 years.  Symptoms worsen after urination and after bowel movements however symptoms are present almost all the time.  She has been evaluated by her gynecologist and PCP with multiple topical medications recommended but none with a lasting effect.  In addition she relates 2 loose stools per day with mild lower abdominal cramping prior to bowel movements.  These symptoms have been presents for about 2 years.  She denies any recent antibiotic usage or diet changes. Denies weight loss, constipation, change in stool caliber, melena, hematochezia, nausea, vomiting, dysphagia, reflux symptoms, chest pain.   Colonoscopy 02/2011: Internal hemorrhoids, otherwise normal  Allergies  Allergen Reactions  . Rofecoxib Swelling    Vioxx caused  swelling of hands& feet  . Coreg [Carvedilol] Other (See Comments)    Caused hot flashes   Outpatient Medications Prior to Visit  Medication Sig Dispense Refill  . aspirin 81 MG tablet Take 81 mg by mouth daily.    . cholecalciferol (VITAMIN D) 1000 units tablet Take 2,000 Units by mouth daily.    . diazepam (VALIUM) 5 MG tablet Take one tab 1 hour prior to MRI, may repeat x 1 if needed 2 tablet 0  . gabapentin (NEURONTIN) 100 MG capsule Take 2 tabs at night, increase by 100 mg every two nights as tolerated 90 capsule 3  . ibuprofen (ADVIL,MOTRIN) 800 MG tablet Take 800 mg by mouth every 8 (eight) hours as needed.    [name] losartan-hydrochlorothiazide (HYZAAR) 100-12.5 MG tablet TAKE 1 TABLET BY MOUTH DAILY. 90 tablet 1  . Multiple Vitamins-Minerals (MULTIVITAMIN) tablet Take 1 tablet by mouth daily.     No facility-administered medications prior to visit.    Past Medical History:  Diagnosis Date  . Anemia    PMH of  . Cervical spinal stenosis    Dr Arnoldo Morale, [place]    . Hypertension   . Internal hemorrhoids   . Migraines    menstrual  . Nonspecific ST-T changes 2007  . Normal nuclear stress test 2007   Past Surgical History:  Procedure Laterality Date  . COLONOSCOPY  2012   neg; [facility]  . CONCUSSION     DUE TO FALL AT AGE 43  . FOOT SURGERY     Right   . LUMBAR LAMINECTOMY  2003   FOR RUPTURED DISC  . ORIF CLAVICULAR FRACTURE     bilaterally @ age 5  . TONSILLECTOMY AND ADENOIDECTOMY     Social History   Socioeconomic History  . Marital status: Married    Spouse name: Not on file  . Number of children: 2  . Years of education: Not on file  . Highest education level: Not on file  Social Needs  . Financial resource strain: Not on file  . Food insecurity - worry: Not on file  . Food insecurity - inability: Not on file  . Transportation needs - medical: Not on file  . Transportation needs - non-medical: Not on file  Occupational History  . Occupation: Chemical engineer: [employer]  Tobacco Use  . Smoking status: Never Smoker  . Smokeless tobacco: Never Used  Substance and Sexual Activity  . Alcohol use: No  . Drug use: No  . Sexual activity: Not on file  Other Topics Concern  .  Not on file  Social History Narrative   Exercises twice a week - boot camp   Daily caffeine    Family History  Problem Relation Age of Onset  . Hypertension Mother        bilateral endarterectomies,pacer,mesenteric artery bypass, renal artery stenosis stented unilaterally, intestinal perforation, steroids for colitis induced osteoprosis, polyps   . Cancer Mother        uterine S/P Tamoxifen  . Transient ischemic attack Mother        in early 68's  . Colon polyps Mother   . Irritable bowel syndrome Mother   . Breast cancer Mother   . Heart disease Mother        pacer  . Asthma Father   . Hypertension Father   . Colon polyps Father   . Heart disease Father        S/P ablation for AF  . Stroke Father        cns bleed on  coumadin  . Hypertension Sister        2 sisters  . Cancer Maternal Grandmother        uterine  . Diabetes Maternal Grandmother   . Cancer Sister        cervical  . Colon cancer Neg Hx      ROS: as in HPI, otherwise negative   Physical Exam: General: Well developed, well nourished, no acute distress Head: Normocephalic and atraumatic Eyes:  sclerae anicteric, EOMI Ears: Normal auditory acuity Mouth: No deformity or lesions Neck: Supple, no masses or thyromegaly Lungs: Clear throughout to auscultation Heart: Regular rate and rhythm; no murmurs, rubs or bruits Abdomen: Soft, non tender and non distended. No masses, hepatosplenomegaly or hernias noted. Normal Bowel sounds Rectal: no lesions, heme neg brown stool. Fissures with erythema in the perineum clearly separate from the anal area.  Musculoskeletal: Symmetrical with no gross deformities  Skin: No lesions on visible extremities Pulses:  Normal pulses noted Extremities: No clubbing, cyanosis, edema or deformities noted Neurological: Alert oriented x 4, grossly nonfocal Cervical Nodes:  No significant cervical adenopathy Inguinal Nodes: No significant inguinal adenopathy Psychological:  Alert and cooperative. Normal mood and affect  Assessment and Recommendations:  1.  Diarrhea, mild.  Rule out food intolerances, IBS, chronic infection and other disorders.  Obtain CMP, CBC, TSH and a GI pathogen panel.  Begin a 7-day lactose-free diet followed by a low-fat diet and then a low caffeine diet if symptoms do not abate.  REV in 6-8 weeks.  2.  Perineum dermatitis.  Does not appear to be a GI process.  Possible fungal dermatitis.  Lotrimin AF twice daily topically for 10 days.  Wash with a mild soap, such as Dove, and water.  Do not apply any other creams, ointments or medications to this area while on Lotrimin AF.  If symptoms do not resolve she is advised to follow-up with her dermatologist for further care.   cc: Binnie Rail,  MD [address] [place], [place] [address]

## 2016-12-19 NOTE — Patient Instructions (Signed)
We have sent the following medications to your pharmacy for you to pick up at your convenience: Lotrimin cream to use to affected area twice daily x 10 days. If not better in 10 days then schedule a Dermatology appointment.   Your physician has requested that you go to the basement for lab work before leaving today.  Thank you for choosing me and [facility].  Pricilla Riffle. Dagoberto Ligas., MD., Marval Regal

## 2017-02-20 ENCOUNTER — Ambulatory Visit: Payer: 59 | Admitting: Gastroenterology

## 2017-03-14 DIAGNOSIS — N762 Acute vulvitis: Secondary | ICD-10-CM | POA: Diagnosis not present

## 2017-05-03 DIAGNOSIS — E559 Vitamin D deficiency, unspecified: Secondary | ICD-10-CM | POA: Insufficient documentation

## 2017-05-03 NOTE — Patient Instructions (Addendum)
Test(s) ordered today. Your results will be released to [place] (or called to you) after review, usually within 72hours after test completion. If any changes need to be made, you will be notified at that same time.  All other Health Maintenance issues reviewed.   All recommended immunizations and age-appropriate screenings are up-to-date or discussed.  No immunizations administered today.   Medications reviewed and updated.  Changes include discontinuing losartan-hctz and start telmisartan-hctz daily.  Monitor your BP at home.  Your prescription(s) have been submitted to your pharmacy. Please take as directed and contact our office if you believe you are having problem(s) with the medication(s).   Please followup in 1 year   Health Maintenance, Female Adopting a healthy lifestyle and getting preventive care can go a long way to promote health and wellness. Talk with your health care provider about what schedule of regular examinations is right for you. This is a good chance for you to check in with your provider about disease prevention and staying healthy. In between checkups, there are plenty of things you can do on your own. Experts have done a lot of research about which lifestyle changes and preventive measures are most likely to keep you healthy. Ask your health care provider for more information. Weight and diet Eat a healthy diet  Be sure to include plenty of vegetables, fruits, low-fat dairy products, and lean protein.  Do not eat a lot of foods high in solid fats, added sugars, or salt.  Get regular exercise. This is one of the most important things you can do for your health. ? Most adults should exercise for at least 150 minutes each week. The exercise should increase your heart rate and make you sweat (moderate-intensity exercise). ? Most adults should also do strengthening exercises at least twice a week. This is in addition to the moderate-intensity exercise.  Maintain a  healthy weight  Body mass index (BMI) is a measurement that can be used to identify possible weight problems. It estimates body fat based on height and weight. Your health care provider can help determine your BMI and help you achieve or maintain a healthy weight.  For females 78 years of age and older: ? A BMI below 18.5 is considered underweight. ? A BMI of 18.5 to 24.9 is normal. ? A BMI of 25 to 29.9 is considered overweight. ? A BMI of 30 and above is considered obese.  Watch levels of cholesterol and blood lipids  You should start having your blood tested for lipids and cholesterol at 58 years of age, then have this test every 5 years.  You may need to have your cholesterol levels checked more often if: ? Your lipid or cholesterol levels are high. ? You are older than 58 years of age. ? You are at high risk for heart disease.  Cancer screening Lung Cancer  Lung cancer screening is recommended for adults 37-57 years old who are at high risk for lung cancer because of a history of smoking.  A yearly low-dose CT scan of the lungs is recommended for people who: ? Currently smoke. ? Have quit within the past 15 years. ? Have at least a 30-pack-year history of smoking. A pack year is smoking an average of one pack of cigarettes a day for 1 year.  Yearly screening should continue until it has been 15 years since you quit.  Yearly screening should stop if you develop a health problem that would prevent you from having lung cancer  treatment.  Breast Cancer  Practice breast self-awareness. This means understanding how your breasts normally appear and feel.  It also means doing regular breast self-exams. Let your health care provider know about any changes, no matter how small.  If you are in your 20s or 30s, you should have a clinical breast exam (CBE) by a health care provider every 1-3 years as part of a regular health exam.  If you are 6 or older, have a CBE every year. Also  consider having a breast X-ray (mammogram) every year.  If you have a family history of breast cancer, talk to your health care provider about genetic screening.  If you are at high risk for breast cancer, talk to your health care provider about having an MRI and a mammogram every year.  Breast cancer gene (BRCA) assessment is recommended for women who have family members with BRCA-related cancers. BRCA-related cancers include: ? Breast. ? Ovarian. ? Tubal. ? Peritoneal cancers.  Results of the assessment will determine the need for genetic counseling and BRCA1 and BRCA2 testing.  Cervical Cancer Your health care provider may recommend that you be screened regularly for cancer of the pelvic organs (ovaries, uterus, and vagina). This screening involves a pelvic examination, including checking for microscopic changes to the surface of your cervix (Pap test). You may be encouraged to have this screening done every 3 years, beginning at age 67.  For women ages 49-65, health care providers may recommend pelvic exams and Pap testing every 3 years, or they may recommend the Pap and pelvic exam, combined with testing for human papilloma virus (HPV), every 5 years. Some types of HPV increase your risk of cervical cancer. Testing for HPV may also be done on women of any age with unclear Pap test results.  Other health care providers may not recommend any screening for nonpregnant women who are considered low risk for pelvic cancer and who do not have symptoms. Ask your health care provider if a screening pelvic exam is right for you.  If you have had past treatment for cervical cancer or a condition that could lead to cancer, you need Pap tests and screening for cancer for at least 20 years after your treatment. If Pap tests have been discontinued, your risk factors (such as having a new sexual partner) need to be reassessed to determine if screening should resume. Some women have medical problems that  increase the chance of getting cervical cancer. In these cases, your health care provider may recommend more frequent screening and Pap tests.  Colorectal Cancer  This type of cancer can be detected and often prevented.  Routine colorectal cancer screening usually begins at 58 years of age and continues through 58 years of age.  Your health care provider may recommend screening at an earlier age if you have risk factors for colon cancer.  Your health care provider may also recommend using home test kits to check for hidden blood in the stool.  A small camera at the end of a tube can be used to examine your colon directly (sigmoidoscopy or colonoscopy). This is done to check for the earliest forms of colorectal cancer.  Routine screening usually begins at age 43.  Direct examination of the colon should be repeated every 5-10 years through 57 years of age. However, you may need to be screened more often if early forms of precancerous polyps or small growths are found.  Skin Cancer  Check your skin from head to toe regularly.  Tell your health care provider about any new moles or changes in moles, especially if there is a change in a mole's shape or color.  Also tell your health care provider if you have a mole that is larger than the size of a pencil eraser.  Always use sunscreen. Apply sunscreen liberally and repeatedly throughout the day.  Protect yourself by wearing long sleeves, pants, a wide-brimmed hat, and sunglasses whenever you are outside.  Heart disease, diabetes, and high blood pressure  High blood pressure causes heart disease and increases the risk of stroke. High blood pressure is more likely to develop in: ? People who have blood pressure in the high end of the normal range (130-139/85-89 mm Hg). ? People who are overweight or obese. ? People who are African American.  If you are 37-95 years of age, have your blood pressure checked every 3-5 years. If you are 55  years of age or older, have your blood pressure checked every year. You should have your blood pressure measured twice-once when you are at a hospital or clinic, and once when you are not at a hospital or clinic. Record the average of the two measurements. To check your blood pressure when you are not at a hospital or clinic, you can use: ? An automated blood pressure machine at a pharmacy. ? A home blood pressure monitor.  If you are between 37 years and 39 years old, ask your health care provider if you should take aspirin to prevent strokes.  Have regular diabetes screenings. This involves taking a blood sample to check your fasting blood sugar level. ? If you are at a normal weight and have a low risk for diabetes, have this test once every three years after 58 years of age. ? If you are overweight and have a high risk for diabetes, consider being tested at a younger age or more often. Preventing infection Hepatitis B  If you have a higher risk for hepatitis B, you should be screened for this virus. You are considered at high risk for hepatitis B if: ? You were born in a country where hepatitis B is common. Ask your health care provider which countries are considered high risk. ? Your parents were born in a high-risk country, and you have not been immunized against hepatitis B (hepatitis B vaccine). ? You have HIV or AIDS. ? You use needles to inject street drugs. ? You live with someone who has hepatitis B. ? You have had sex with someone who has hepatitis B. ? You get hemodialysis treatment. ? You take certain medicines for conditions, including cancer, organ transplantation, and autoimmune conditions.  Hepatitis C  Blood testing is recommended for: ? Everyone born from [birth year] through 1966. ? Anyone with known risk factors for hepatitis C.  Sexually transmitted infections (STIs)  You should be screened for sexually transmitted infections (STIs) including gonorrhea and chlamydia  if: ? You are sexually active and are younger than 58 years of age. ? You are older than 58 years of age and your health care provider tells you that you are at risk for this type of infection. ? Your sexual activity has changed since you were last screened and you are at an increased risk for chlamydia or gonorrhea. Ask your health care provider if you are at risk.  If you do not have HIV, but are at risk, it may be recommended that you take a prescription medicine daily to prevent HIV infection. This is called  pre-exposure prophylaxis (PrEP). You are considered at risk if: ? You are sexually active and do not regularly use condoms or know the HIV status of your partner(s). ? You take drugs by injection. ? You are sexually active with a partner who has HIV.  Talk with your health care provider about whether you are at high risk of being infected with HIV. If you choose to begin PrEP, you should first be tested for HIV. You should then be tested every 3 months for as long as you are taking PrEP. Pregnancy  If you are premenopausal and you may become pregnant, ask your health care provider about preconception counseling.  If you may become pregnant, take 400 to 800 micrograms (mcg) of folic acid every day.  If you want to prevent pregnancy, talk to your health care provider about birth control (contraception). Osteoporosis and menopause  Osteoporosis is a disease in which the bones lose minerals and strength with aging. This can result in serious bone fractures. Your risk for osteoporosis can be identified using a bone density scan.  If you are 85 years of age or older, or if you are at risk for osteoporosis and fractures, ask your health care provider if you should be screened.  Ask your health care provider whether you should take a calcium or vitamin D supplement to lower your risk for osteoporosis.  Menopause may have certain physical symptoms and risks.  Hormone replacement therapy  may reduce some of these symptoms and risks. Talk to your health care provider about whether hormone replacement therapy is right for you. Follow these instructions at home:  Schedule regular health, dental, and eye exams.  Stay current with your immunizations.  Do not use any tobacco products including cigarettes, chewing tobacco, or electronic cigarettes.  If you are pregnant, do not drink alcohol.  If you are breastfeeding, limit how much and how often you drink alcohol.  Limit alcohol intake to no more than 1 drink per day for nonpregnant women. One drink equals 12 ounces of beer, 5 ounces of wine, or 1 ounces of hard liquor.  Do not use street drugs.  Do not share needles.  Ask your health care provider for help if you need support or information about quitting drugs.  Tell your health care provider if you often feel depressed.  Tell your health care provider if you have ever been abused or do not feel safe at home. This information is not intended to replace advice given to you by your health care provider. Make sure you discuss any questions you have with your health care provider. Document Released: 08/15/2010 Document Revised: 07/08/2015 Document Reviewed: 11/03/2014 Elsevier Interactive Patient Education  [employer].

## 2017-05-03 NOTE — Progress Notes (Signed)
Subjective:    Patient ID: Whitney Blake, female    DOB: Jun 29, 1960, 57 y.o.   MRN: [id number]  HPI She is here for a physical exam.   Cold symptoms:  It started 4 days ago.  She has a sore throat that has resolved.  She has nasal congestion and a dry cough.  She took dayquil and got diarrhea so stopped.  She is now only taking tylenol.    She would like to change her losartan to a different medication due to the recall.     Medications and allergies reviewed with patient and updated if appropriate.  Patient Active Problem List   Diagnosis Date Noted  . Lichen sclerosus of female genitalia 05/04/2017  . Vitamin D deficiency 05/03/2017  . Lumbar radiculopathy 05/15/2016  . Prediabetes 04/29/2016  . Breast pain, left 04/27/2016  . Leg pain, bilateral 04/23/2015  . Anxiety state 04/23/2015  . Nephrolithiasis 09/06/2012  . Essential hypertension 12/07/2011  . HOT FLASHES 02/03/2010  . Sleep disorder 05/25/2009  . Migraine 12/21/2008  . Postmenopausal 12/21/2008  . Anemia 09/03/2007  . NONSPECIFIC ABNORMAL ELECTROCARDIOGRAM 09/03/2007  . SPINAL STENOSIS 06/14/2006    Current Outpatient Medications on File Prior to Visit  Medication Sig Dispense Refill  . aspirin 81 MG tablet Take 81 mg by mouth daily.    . cholecalciferol (VITAMIN D) 1000 units tablet Take 2,000 Units by mouth daily.    [name] losartan-hydrochlorothiazide (HYZAAR) 100-12.5 MG tablet TAKE 1 TABLET BY MOUTH DAILY. 90 tablet 1   No current facility-administered medications on file prior to visit.     Past Medical History:  Diagnosis Date  . Anemia    PMH of  . Cervical spinal stenosis    Dr Arnoldo Morale, [place]  . Hypertension   . Internal hemorrhoids   . Migraines    menstrual  . Nonspecific ST-T changes 2007  . Normal nuclear stress test 2007    Past Surgical History:  Procedure Laterality Date  . COLONOSCOPY  2012   neg; [facility]  . CONCUSSION     DUE TO FALL AT AGE 35  . FOOT SURGERY     Right   .  LUMBAR LAMINECTOMY  2003   FOR RUPTURED DISC  . ORIF CLAVICULAR FRACTURE     bilaterally @ age 78  . TONSILLECTOMY AND ADENOIDECTOMY      Social History   Socioeconomic History  . Marital status: Married    Spouse name: Not on file  . Number of children: 2  . Years of education: Not on file  . Highest education level: Not on file  Occupational History  . Occupation: Chemical engineer: Editor, commissioning  Social Needs  . Financial resource strain: Not on file  . Food insecurity:    Worry: Not on file    Inability: Not on file  . Transportation needs:    Medical: Not on file    Non-medical: Not on file  Tobacco Use  . Smoking status: Never Smoker  . Smokeless tobacco: Never Used  Substance and Sexual Activity  . Alcohol use: No  . Drug use: No  . Sexual activity: Not on file  Lifestyle  . Physical activity:    Days per week: Not on file    Minutes per session: Not on file  . Stress: Not on file  Relationships  . Social connections:    Talks on phone: Not on file    Gets together: Not on  file    Attends religious service: Not on file    Active member of club or organization: Not on file    Attends meetings of clubs or organizations: Not on file    Relationship status: Not on file  Other Topics Concern  . Not on file  Social History Narrative   Exercises twice a week - [employer]   Daily caffeine     Family History  Problem Relation Age of Onset  . Hypertension Mother        bilateral endarterectomies,pacer,mesenteric artery bypass, renal artery stenosis stented unilaterally, intestinal perforation, steroids for colitis induced osteoprosis, polyps   . Cancer Mother        uterine S/P Tamoxifen  . Transient ischemic attack Mother        in early 21's  . Colon polyps Mother   . Irritable bowel syndrome Mother   . Breast cancer Mother   . Heart disease Mother        pacer  . Asthma Father   . Hypertension Father   . Colon polyps Father   . Heart  disease Father        S/P ablation for AF  . Stroke Father        cns bleed on coumadin  . Hypertension Sister        2 sisters  . Cancer Maternal Grandmother        uterine  . Diabetes Maternal Grandmother   . Cancer Sister        cervical  . Colon cancer Neg Hx     Review of Systems  Constitutional: Negative for chills and fever.  HENT: Positive for congestion, postnasal drip and sinus pressure. Negative for ear pain, sinus pain and sore throat.   Eyes: Negative for visual disturbance.  Respiratory: Positive for cough (dry this week with cold). Negative for shortness of breath and wheezing.   Cardiovascular: Positive for chest pain (left sided chest pain - lasts 1 sec x 2-3 months, not increasing, not daily) and palpitations (ooc, chronic). Negative for leg swelling.  Gastrointestinal: Positive for diarrhea (just recently). Negative for abdominal pain, blood in stool, constipation and nausea.       No gerd  Genitourinary: Negative for dysuria and hematuria.  Musculoskeletal: Positive for myalgias (with cold). Negative for neck pain.  Skin: Negative for color change and rash.  Neurological: Positive for headaches (mild frontal pressure with cold). Negative for dizziness and light-headedness.  Psychiatric/Behavioral: Negative for dysphoric mood. The patient is not nervous/anxious.        Objective:   Vitals:   05/04/17 0944  BP: 128/80  Pulse: 68  Resp: 16  Temp: 98.2 F (36.8 C)  SpO2: 98%   Filed Weights   05/04/17 0944  Weight: 181 lb (82.1 kg)   Body mass index is 29.21 kg/m.  Wt Readings from Last 3 Encounters:  05/04/17 181 lb (82.1 kg)  12/19/16 176 lb (79.8 kg)  05/15/16 182 lb (82.6 kg)     Physical Exam Constitutional: She appears well-developed and well-nourished. No distress.  HENT:  Head: Normocephalic and atraumatic.  Right Ear: External ear normal. Normal ear canal and TM Left Ear: External ear normal.  Normal ear canal and TM Mouth/Throat:  Oropharynx has mild erythema and moist.  Eyes: Conjunctivae and EOM are normal.  Neck: Neck supple. No tracheal deviation present. No thyromegaly present.  No carotid bruit  Cardiovascular: Normal rate, regular rhythm and normal heart sounds.   No murmur  heard.  No edema. Pulmonary/Chest: Effort normal and breath sounds normal. No respiratory distress. She has no wheezes. She has no rales.  Breast: deferred to Gyn Abdominal: Soft. She exhibits no distension. There is no tenderness.  Lymphadenopathy: She has no cervical adenopathy.  Skin: Skin is warm and dry. She is not diaphoretic.  Psychiatric: She has a normal mood and affect. Her behavior is normal.        Assessment & Plan:   Physical exam: Screening blood work   ordered Immunizations  Discussed shingrix,  Others up to date Colonoscopy   Up to date  Mammogram   Up to date  72    - Dr Willis Modena Dexa      Up to date  Eye exams   Up to date  EKG   04/2016 Exercise  Exercising at least 3/week Weight  Encouraged weight loss Skin   No concerns Substance abuse  none  See Problem List for Assessment and Plan of chronic medical problems.   FU in one year

## 2017-05-04 ENCOUNTER — Encounter: Payer: Self-pay | Admitting: Internal Medicine

## 2017-05-04 ENCOUNTER — Other Ambulatory Visit (INDEPENDENT_AMBULATORY_CARE_PROVIDER_SITE_OTHER): Payer: 59

## 2017-05-04 ENCOUNTER — Ambulatory Visit (INDEPENDENT_AMBULATORY_CARE_PROVIDER_SITE_OTHER): Payer: 59 | Admitting: Internal Medicine

## 2017-05-04 VITALS — BP 128/80 | HR 68 | Temp 98.2°F | Resp 16 | Ht 66.0 in | Wt 181.0 lb

## 2017-05-04 DIAGNOSIS — E559 Vitamin D deficiency, unspecified: Secondary | ICD-10-CM

## 2017-05-04 DIAGNOSIS — I10 Essential (primary) hypertension: Secondary | ICD-10-CM

## 2017-05-04 DIAGNOSIS — R7303 Prediabetes: Secondary | ICD-10-CM | POA: Diagnosis not present

## 2017-05-04 DIAGNOSIS — Z0000 Encounter for general adult medical examination without abnormal findings: Secondary | ICD-10-CM | POA: Diagnosis not present

## 2017-05-04 DIAGNOSIS — M5416 Radiculopathy, lumbar region: Secondary | ICD-10-CM

## 2017-05-04 DIAGNOSIS — G43809 Other migraine, not intractable, without status migrainosus: Secondary | ICD-10-CM

## 2017-05-04 DIAGNOSIS — N904 Leukoplakia of vulva: Secondary | ICD-10-CM | POA: Insufficient documentation

## 2017-05-04 LAB — TSH: TSH: 2.32 u[IU]/mL (ref 0.35–4.50)

## 2017-05-04 LAB — COMPREHENSIVE METABOLIC PANEL
ALT: 41 U/L — AB (ref 0–35)
AST: 30 U/L (ref 0–37)
Albumin: 4.3 g/dL (ref 3.5–5.2)
Alkaline Phosphatase: 53 U/L (ref 39–117)
BILIRUBIN TOTAL: 0.4 mg/dL (ref 0.2–1.2)
BUN: 16 mg/dL (ref 6–23)
CO2: 26 meq/L (ref 19–32)
Calcium: 9.5 mg/dL (ref 8.4–10.5)
Chloride: 102 mEq/L (ref 96–112)
Creatinine, Ser: 1.05 mg/dL (ref 0.40–1.20)
GFR: 57.25 mL/min — ABNORMAL LOW (ref >60.00)
GLUCOSE: 96 mg/dL (ref 70–99)
Potassium: 4.1 mEq/L (ref 3.5–5.1)
SODIUM: 140 meq/L (ref 135–145)
Total Protein: 7.6 g/dL (ref 6.0–8.3)

## 2017-05-04 LAB — CBC WITH DIFFERENTIAL/PLATELET
BASOS ABS: 0.1 10*3/uL (ref 0.0–0.1)
Basophils Relative: 0.9 % (ref 0.0–3.0)
Eosinophils Absolute: 0.2 10*3/uL (ref 0.0–0.7)
Eosinophils Relative: 2.3 % (ref 0.0–5.0)
HCT: 43.5 % (ref 36.0–46.0)
HEMOGLOBIN: 14.8 g/dL (ref 12.0–15.0)
LYMPHS ABS: 2 10*3/uL (ref 0.7–4.0)
Lymphocytes Relative: 24.8 % (ref 12.0–46.0)
MCHC: 34 g/dL (ref 30.0–36.0)
MCV: 86.8 fl (ref 78.0–100.0)
Monocytes Absolute: 0.6 10*3/uL (ref 0.1–1.0)
Monocytes Relative: 7.5 % (ref 3.0–12.0)
NEUTROS PCT: 64.5 % (ref 43.0–77.0)
Neutro Abs: 5.2 10*3/uL (ref 1.4–7.7)
Platelets: 253 10*3/uL (ref 150.0–400.0)
RBC: 5 Mil/uL (ref 3.87–5.11)
RDW: 14.1 % (ref 11.5–15.5)
WBC: 8.1 10*3/uL (ref 4.0–10.5)

## 2017-05-04 LAB — HEMOGLOBIN A1C: Hgb A1c MFr Bld: 5.8 % (ref 4.6–6.5)

## 2017-05-04 LAB — LIPID PANEL
CHOL/HDL RATIO: 3
Cholesterol: 208 mg/dL — ABNORMAL HIGH (ref 0–200)
HDL: 62 mg/dL (ref >39.00)
LDL Cholesterol: 117 mg/dL — ABNORMAL HIGH (ref 0–99)
NONHDL: 146.4
Triglycerides: 149 mg/dL (ref 0.0–149.0)
VLDL: 29.8 mg/dL (ref 0.0–40.0)

## 2017-05-04 LAB — VITAMIN D 25 HYDROXY (VIT D DEFICIENCY, FRACTURES): VITD: 38.28 ng/mL (ref 30.00–100.00)

## 2017-05-04 MED ORDER — TELMISARTAN-HCTZ 40-12.5 MG PO TABS: 1.0000 | ORAL_TABLET | Freq: Every day | ORAL | 5 refills | Status: DC

## 2017-05-04 NOTE — Assessment & Plan Note (Addendum)
BP well controlled Will change losartan due to recall - start telmisartan-hctz daily Monitor BP at home  Continue regular exercise

## 2017-05-04 NOTE — Assessment & Plan Note (Signed)
Check a1c Low sugar / carb diet Stressed regular exercise   

## 2017-05-04 NOTE — Assessment & Plan Note (Signed)
Taking vitamin d Check level 

## 2017-05-04 NOTE — Assessment & Plan Note (Signed)
S/p surgery Now no pain

## 2017-05-04 NOTE — Assessment & Plan Note (Signed)
Has not had a migraine in a long time It was hormonal

## 2017-05-05 ENCOUNTER — Encounter: Payer: Self-pay | Admitting: Internal Medicine

## 2017-06-12 DIAGNOSIS — Z130 Encounter for screening for diseases of the blood and blood-forming organs and certain disorders involving the immune mechanism: Secondary | ICD-10-CM | POA: Diagnosis not present

## 2017-06-12 DIAGNOSIS — Z01419 Encounter for gynecological examination (general) (routine) without abnormal findings: Secondary | ICD-10-CM | POA: Diagnosis not present

## 2017-06-12 DIAGNOSIS — Z1389 Encounter for screening for other disorder: Secondary | ICD-10-CM | POA: Diagnosis not present

## 2017-09-01 ENCOUNTER — Other Ambulatory Visit: Payer: Self-pay | Admitting: Internal Medicine

## 2017-09-17 ENCOUNTER — Other Ambulatory Visit: Payer: Self-pay | Admitting: Obstetrics and Gynecology

## 2017-09-17 DIAGNOSIS — Z1231 Encounter for screening mammogram for malignant neoplasm of breast: Secondary | ICD-10-CM

## 2017-10-11 ENCOUNTER — Ambulatory Visit
Admission: RE | Admit: 2017-10-11 | Discharge: 2017-10-11 | Disposition: A | Discharge status: Home / Self Care | Payer: 59 | Source: Ambulatory Visit | Attending: Obstetrics and Gynecology | Admitting: Obstetrics and Gynecology

## 2017-10-11 DIAGNOSIS — Z1231 Encounter for screening mammogram for malignant neoplasm of breast: Secondary | ICD-10-CM | POA: Diagnosis not present

## 2017-10-12 ENCOUNTER — Other Ambulatory Visit: Payer: Self-pay | Admitting: Obstetrics and Gynecology

## 2017-10-12 DIAGNOSIS — R928 Other abnormal and inconclusive findings on diagnostic imaging of breast: Secondary | ICD-10-CM

## 2017-10-19 ENCOUNTER — Ambulatory Visit: Payer: 59

## 2017-10-19 ENCOUNTER — Ambulatory Visit
Admission: RE | Admit: 2017-10-19 | Discharge: 2017-10-19 | Disposition: A | Discharge status: Home / Self Care | Payer: 59 | Source: Ambulatory Visit | Attending: Obstetrics and Gynecology | Admitting: Obstetrics and Gynecology

## 2017-10-19 DIAGNOSIS — R928 Other abnormal and inconclusive findings on diagnostic imaging of breast: Secondary | ICD-10-CM

## 2017-11-08 ENCOUNTER — Encounter: Payer: Self-pay | Admitting: Internal Medicine

## 2017-11-08 DIAGNOSIS — M79673 Pain in unspecified foot: Secondary | ICD-10-CM

## 2017-12-11 DIAGNOSIS — M79672 Pain in left foot: Secondary | ICD-10-CM | POA: Diagnosis not present

## 2017-12-11 DIAGNOSIS — N941 Unspecified dyspareunia: Secondary | ICD-10-CM | POA: Insufficient documentation

## 2017-12-11 DIAGNOSIS — Z23 Encounter for immunization: Secondary | ICD-10-CM | POA: Diagnosis not present

## 2017-12-11 DIAGNOSIS — S96912A Strain of unspecified muscle and tendon at ankle and foot level, left foot, initial encounter: Secondary | ICD-10-CM | POA: Diagnosis not present

## 2017-12-18 ENCOUNTER — Ambulatory Visit (INDEPENDENT_AMBULATORY_CARE_PROVIDER_SITE_OTHER): Payer: 59 | Admitting: Sports Medicine

## 2017-12-18 ENCOUNTER — Ambulatory Visit (INDEPENDENT_AMBULATORY_CARE_PROVIDER_SITE_OTHER): Payer: 59

## 2017-12-18 ENCOUNTER — Encounter: Payer: Self-pay | Admitting: Sports Medicine

## 2017-12-18 ENCOUNTER — Other Ambulatory Visit: Payer: Self-pay | Admitting: Sports Medicine

## 2017-12-18 VITALS — BP 150/85 | HR 58 | Resp 16

## 2017-12-18 DIAGNOSIS — M792 Neuralgia and neuritis, unspecified: Secondary | ICD-10-CM

## 2017-12-18 DIAGNOSIS — M779 Enthesopathy, unspecified: Secondary | ICD-10-CM

## 2017-12-18 DIAGNOSIS — M79671 Pain in right foot: Secondary | ICD-10-CM | POA: Diagnosis not present

## 2017-12-18 DIAGNOSIS — M79672 Pain in left foot: Secondary | ICD-10-CM

## 2017-12-18 DIAGNOSIS — M5416 Radiculopathy, lumbar region: Secondary | ICD-10-CM | POA: Diagnosis not present

## 2017-12-18 DIAGNOSIS — L84 Corns and callosities: Secondary | ICD-10-CM

## 2017-12-18 DIAGNOSIS — M7741 Metatarsalgia, right foot: Secondary | ICD-10-CM

## 2017-12-18 NOTE — Progress Notes (Signed)
Subjective: Whitney Blake is a 57 y.o. female patient who presents to office for evaluation of right and left foot pain. Patient complains of progressive pain especially over the last 5 to 6 years states that she has had pain ever since surgery on the right foot admits to a history of bunionectomy as well as excision of neuroma performed by another podiatrist states that after the surgery she developed increased pain across the toes and stiffness.  Patient reports that pain is starting to slowly become more noticeable and more unbearable patient is also concerned about callus formation on the right foot and the increased pain even with light touch states that she also notices that she gets cramps across the top of the foot that seems like it radiates up to her knee for the last 6 months.  Patient also admits that there is some pain to the dorsal surface of her left foot states that she went to urgent care last Tuesday and was told that she likely had an issue with her tendons had x-ray which was negative and states that this foot is slowly getting better but she is concerned about her right foot especially with her history of previous surgery. Patient has tried ice and Aleve with no relief in symptoms. Patient denies any other pedal complaints.  Review of Systems  Musculoskeletal: Positive for back pain and joint pain.  Neurological: Positive for sensory change.  All other systems reviewed and are negative.    Patient Active Problem List   Diagnosis Date Noted  . Unspecified dyspareunia (CODE) 12/11/2017  . Lichen sclerosus of female genitalia 05/04/2017  . Vitamin D deficiency 05/03/2017  . Lumbar radiculopathy 05/15/2016  . Prediabetes 04/29/2016  . Breast pain, left 04/27/2016  . Leg pain, bilateral 04/23/2015  . Nephrolithiasis 09/06/2012  . Essential hypertension 12/07/2011  . Sleep disorder 05/25/2009  . Migraine 12/21/2008  . Postmenopausal 12/21/2008  . Anemia 09/03/2007  .  NONSPECIFIC ABNORMAL ELECTROCARDIOGRAM 09/03/2007  . SPINAL STENOSIS 06/14/2006    Current Outpatient Medications on File Prior to Visit  Medication Sig Dispense Refill  . aspirin 81 MG tablet Take 81 mg by mouth daily.    . cholecalciferol (VITAMIN D) 1000 units tablet Take 2,000 Units by mouth daily.    . cholecalciferol (VITAMIN D) 1000 units tablet Vitamin D    . telmisartan-hydrochlorothiazide (MICARDIS HCT) 40-12.5 MG tablet TAKE 1 TABLET BY MOUTH EVERY DAY 90 tablet 1   No current facility-administered medications on file prior to visit.     Allergies  Allergen Reactions  . Rofecoxib Swelling    Vioxx caused  swelling of hands& feet  . Coreg [Carvedilol] Other (See Comments)    Caused hot flashes    Objective:  General: Alert and oriented x3 in no acute distress  Dermatology: Well-healed surgical scars right foot, no open lesions bilateral lower extremities, no webspace macerations, mild reactive callus formation noted plantar first metatarsal head and plantar medial hallux right foot, no ecchymosis bilateral, all nails x 10 are well manicured.  Vascular: Dorsalis Pedis and Posterior Tibial pedal pulses palpable, Capillary Fill Time 3 seconds,(+) pedal hair growth bilateral, no edema bilateral lower extremities, Temperature gradient within normal limits.  Neurology: Johney Maine sensation intact via light touch bilateral, there is increased sensitivity even with light touch noted to the right foot concerning for possible neuritis with history of radiculopathy, (- )Tinels sign bilateral.  There is subjective numbness plantar right foot.  Musculoskeletal: There is limitation on range of motion  at the right first metatarsophalangeal joint and to the lesser toes.  There is no pain to palpation to the left dorsal midfoot.  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  Assessment and Plan: Problem List Items Addressed This Visit      Nervous and Auditory   Lumbar radiculopathy    Other Visit Diagnoses    Right foot pain    -  Primary   Relevant Orders   DG Foot Complete Right   Left foot pain       Relevant Orders   DG Foot Complete Left   Neuritis       Callus       Tendinitis       Metatarsalgia, right foot           -Complete examination performed -Xrays reviewed -Discussed treatement options for likely pain related to condition of her nerves versus mechanical on the right foot versus scar tissue and likely tendinitis on the left -Rx nerve conduction studies and advised patient that we will review these first and if they are negative then may be consider injection therapy versus physical therapy and remake of custom orthotics -Patient to continue with supportive measures including rest ice elevation and over-the-counter anti-inflammatories as needed -Patient to return to office after nerve conduction test or sooner if condition worsens.  Landis Martins, DPM

## 2017-12-19 ENCOUNTER — Telehealth: Payer: Self-pay | Admitting: *Deleted

## 2017-12-19 ENCOUNTER — Telehealth: Payer: 59 | Admitting: Family

## 2017-12-19 DIAGNOSIS — M79672 Pain in left foot: Secondary | ICD-10-CM

## 2017-12-19 DIAGNOSIS — R05 Cough: Secondary | ICD-10-CM

## 2017-12-19 DIAGNOSIS — M79671 Pain in right foot: Secondary | ICD-10-CM

## 2017-12-19 DIAGNOSIS — R059 Cough, unspecified: Secondary | ICD-10-CM

## 2017-12-19 DIAGNOSIS — M5416 Radiculopathy, lumbar region: Secondary | ICD-10-CM

## 2017-12-19 MED ORDER — BENZONATATE 100 MG PO CAPS: 100.0000 mg | ORAL_CAPSULE | Freq: Three times a day (TID) | ORAL | 0 refills | Status: DC | PRN

## 2017-12-19 NOTE — Telephone Encounter (Signed)
I called pt and she states she sees Dr. Arnoldo Morale. I told pt Dr. Arnoldo Morale is with [facility] and I would schedule the NCV with EMG there. Faxed required form, orders and demographics to [facility].

## 2017-12-19 NOTE — Progress Notes (Signed)
We are sorry that you are not feeling well.  Here is how we plan to help!  Based on your presentation I believe you most likely have A cough due to allergies.  I recommend that you start the an over-the counter-allergy medication such as Claritin 10 mg or Zyrtec 10 mg daily.     In addition you may use A non-prescription cough medication called Robitussin DAC. Take 2 teaspoons every 8 hours or Delsym: take 2 teaspoons every 12 hours.  Prednisone 5 mg daily for 6 days (see taper instructions below) has been sent to the pharmacy for you.  From your responses in the eVisit questionnaire you describe inflammation in the upper respiratory tract which is causing a significant cough.  This is commonly called Bronchitis and has four common causes:    Allergies  Viral Infections  Acid Reflux  Bacterial Infection Allergies, viruses and acid reflux are treated by controlling symptoms or eliminating the cause. An example might be a cough caused by taking certain blood pressure medications. You stop the cough by changing the medication. Another example might be a cough caused by acid reflux. Controlling the reflux helps control the cough.  USE OF BRONCHODILATOR ("RESCUE") INHALERS: There is a risk from using your bronchodilator too frequently.  The risk is that over-reliance on a medication which only relaxes the muscles surrounding the breathing tubes can reduce the effectiveness of medications prescribed to reduce swelling and congestion of the tubes themselves.  Although you feel brief relief from the bronchodilator inhaler, your asthma may actually be worsening with the tubes becoming more swollen and filled with mucus.  This can delay other crucial treatments, such as oral steroid medications. If you need to use a bronchodilator inhaler daily, several times per day, you should discuss this with your provider.  There are probably better treatments that could be used to keep your asthma under control.      HOME CARE . Only take medications as instructed by your medical team. . Complete the entire course of an antibiotic. . Drink plenty of fluids and get plenty of rest. . Avoid close contacts especially the very young and the elderly . Cover your mouth if you cough or cough into your sleeve. . Always remember to wash your hands . A steam or ultrasonic humidifier can help congestion.   GET HELP RIGHT AWAY IF: . You develop worsening fever. . You become short of breath . You cough up blood. . Your symptoms persist after you have completed your treatment plan MAKE SURE YOU   Understand these instructions.  Will watch your condition.  Will get help right away if you are not doing well or get worse.  Your e-visit answers were reviewed by a board certified advanced clinical practitioner to complete your personal care plan.  Depending on the condition, your plan could have included both over the counter or prescription medications. If there is a problem please reply  once you have received a response from your provider. Your safety is important to [name].  If you have drug allergies check your prescription carefully.    You can use MyChart to ask questions about today's visit, request a non-urgent call back, or ask for a work or school excuse for 24 hours related to this e-Visit. If it has been greater than 24 hours you will need to follow up with your provider, or enter a new e-Visit to address those concerns. You will get an e-mail in the next two days  asking about your experience.  I hope that your e-visit has been valuable and will speed your recovery. Thank you for using e-visits.

## 2017-12-19 NOTE — Progress Notes (Signed)

## 2017-12-19 NOTE — Telephone Encounter (Signed)
-----   Message from Landis Martins, [place] sent at 12/19/2017  1:18 PM EST ----- Regarding: RE: NCV Patient said she thinks her back surgeon was affiliated with [facility] but you can confirm this with the patient before ordering.  Thanks Dr. Cannon Kettle ----- Message ----- From: Andres Ege, RN Sent: 12/19/2017   9:30 AM EST To: Landis Martins, DPM Subject: RE: NCV                                        Dr. Cannon Kettle, I am afraid I don't understand who you would like pt referred to for nerve testing. Pt's internal medicine is [facility]. Do you want me to refer pt to [facility] for testing? Marcy Siren ----- Message ----- From: Landis Martins, DPM Sent: 12/18/2017  11:35 AM EST To: Andres Ege, RN Subject: NCV                                            R foot pain and hypersensitivity and numbness after foot surgery in 2013 and history of radiculopathy and back surgery Patient prefers to go to [facility] to have testing done states that her back surgeon she believes is associated with this practice.

## 2017-12-19 NOTE — Telephone Encounter (Deleted)
-----   Message from [place], [place] sent at 12/18/2017 11:35 AM EST ----- Regarding: NCV R foot pain and hypersensitivity and numbness after foot surgery in 2013 and history of radiculopathy and back surgery Patient prefers to go to [facility] to have testing done states that her back surgeon she believes is associated with this practice.

## 2017-12-19 NOTE — Telephone Encounter (Deleted)
-----   Message from [place], [place] sent at 12/18/2017 11:35 AM EST ----- Regarding: NCV R foot pain and hypersensitivity and numbness after foot surgery in 2013 and history of radiculopathy and back surgery Patient prefers to go to [facility] to have testing done states that her back surgeon she believes is associated with this practice.

## 2017-12-25 ENCOUNTER — Telehealth: Payer: Self-pay | Admitting: *Deleted

## 2017-12-25 NOTE — Telephone Encounter (Signed)
[facility] states there is no indicator if the NCV is to be upper or lower extremities. I told Whitney Blake it would be B/L lower extremities. Whitney Blake states pt is scheduled for 12/31/2017 with Dr. Brien Few.

## 2017-12-31 DIAGNOSIS — M5416 Radiculopathy, lumbar region: Secondary | ICD-10-CM | POA: Diagnosis not present

## 2018-01-07 ENCOUNTER — Telehealth: Payer: Self-pay | Admitting: Sports Medicine

## 2018-01-07 NOTE — Telephone Encounter (Signed)
Left message for NCV with EMG results to be faxed to [phone].

## 2018-01-07 NOTE — Telephone Encounter (Signed)
Pt was referred to have nerve test and is following up to see if Dr. Cannon Kettle has received any results. Give pt a call.

## 2018-01-08 ENCOUNTER — Telehealth: Payer: Self-pay | Admitting: Sports Medicine

## 2018-01-08 NOTE — Telephone Encounter (Signed)
Patient was calling about results.

## 2018-01-08 NOTE — Telephone Encounter (Signed)
Please let patient know that her NCV/EMG show changes consistent with her radiculopathy (nerve issues from her spine) as well changes at the level of her foot consistent with her old neuroma surgery. She should talk with her neurosurgeon about her back as far as her foot. We can try a injection of steroid injection to help with her foot symptoms. She can make an appt to be seen in 1-2 weeks Thanks Dr. Cannon Kettle

## 2018-01-08 NOTE — Telephone Encounter (Signed)
Received report from [facility], copied and gave to Dr. Cannon Kettle. Original sent to be scanned.

## 2018-01-09 NOTE — Telephone Encounter (Signed)
I informed pt of Dr. Leeanne Rio review of results and recommendations. Pt states understanding and would like to schedule to get an injection. Transferred to scheduler.

## 2018-01-22 ENCOUNTER — Ambulatory Visit: Payer: 59 | Admitting: Sports Medicine

## 2018-01-29 ENCOUNTER — Ambulatory Visit: Payer: 59 | Admitting: Sports Medicine

## 2018-01-29 ENCOUNTER — Encounter: Payer: Self-pay | Admitting: Sports Medicine

## 2018-01-29 DIAGNOSIS — M792 Neuralgia and neuritis, unspecified: Secondary | ICD-10-CM | POA: Diagnosis not present

## 2018-01-29 DIAGNOSIS — M7741 Metatarsalgia, right foot: Secondary | ICD-10-CM

## 2018-01-29 DIAGNOSIS — M779 Enthesopathy, unspecified: Secondary | ICD-10-CM

## 2018-01-29 DIAGNOSIS — M79671 Pain in right foot: Secondary | ICD-10-CM

## 2018-01-29 DIAGNOSIS — M5416 Radiculopathy, lumbar region: Secondary | ICD-10-CM

## 2018-01-29 MED ORDER — TRIAMCINOLONE ACETONIDE 10 MG/ML IJ SUSP
10.0000 mg | Freq: Once | INTRAMUSCULAR | Status: AC
  Administered 2018-01-29: 10 mg

## 2018-01-29 NOTE — Progress Notes (Signed)
Subjective: Whitney Blake is a 57 y.o. female patient who presents to office for evaluation of right foot pain. Patient complains of continued burning pain to the right foot and pain that is worsening with pressure with cramps and shooting pain that even goes up her leg.  Patient had nerve conduction studies done which revealed radiculopathy.  My nurse called patient and discussed treatment options and patient elects at this time to try an injection in her foot to see if this will help with her right foot pain and symptoms. Denies any changes with medical history since last visit.  Patient Active Problem List   Diagnosis Date Noted  . Unspecified dyspareunia (CODE) 12/11/2017  . Lichen sclerosus of female genitalia 05/04/2017  . Vitamin D deficiency 05/03/2017  . Lumbar radiculopathy 05/15/2016  . Prediabetes 04/29/2016  . Breast pain, left 04/27/2016  . Leg pain, bilateral 04/23/2015  . Nephrolithiasis 09/06/2012  . Essential hypertension 12/07/2011  . Sleep disorder 05/25/2009  . Migraine 12/21/2008  . Postmenopausal 12/21/2008  . Anemia 09/03/2007  . NONSPECIFIC ABNORMAL ELECTROCARDIOGRAM 09/03/2007  . SPINAL STENOSIS 06/14/2006    Current Outpatient Medications on File Prior to Visit  Medication Sig Dispense Refill  . aspirin 81 MG tablet Take 81 mg by mouth daily.    . benzonatate (TESSALON PERLES) 100 MG capsule Take 1 capsule (100 mg total) by mouth 3 (three) times daily as needed. 20 capsule 0  . cholecalciferol (VITAMIN D) 1000 units tablet Take 2,000 Units by mouth daily.    . cholecalciferol (VITAMIN D) 1000 units tablet Vitamin D    . telmisartan-hydrochlorothiazide (MICARDIS HCT) 40-12.5 MG tablet TAKE 1 TABLET BY MOUTH EVERY DAY 90 tablet 1   No current facility-administered medications on file prior to visit.     Allergies  Allergen Reactions  . Rofecoxib Swelling    Vioxx caused  swelling of hands& feet  . Coreg [Carvedilol] Other (See Comments)    Caused hot  flashes    Objective:  General: Alert and oriented x3 in no acute distress  Dermatology: Old surgical scars right foot.  No open lesions bilateral lower extremities, no webspace macerations, no ecchymosis bilateral, all nails x 10 are well manicured.  Minimal reactive callus formation noted plantar aspect of first metatarsal head and medial hallux right foot.  Vascular: Dorsalis Pedis and Posterior Tibial pedal pulses palpable, Capillary Fill Time 3 seconds,(+) pedal hair growth bilateral, no edema bilateral lower extremities, Temperature gradient within normal limits.  Neurology: Johney Maine sensation intact via light touch bilateral, subjective numbness to plantar aspect of right foot with history of radiculopathy and previous neuroma excision.  Musculoskeletal: Mild tenderness with palpation at lesser MPJs with most pain noted on today's exam plantar second metatarsal phalangeal joint and second interspace greater than the third interspace where she has had previous neurectomy on right foot no pain with calf compression bilateral. There is decreased first metatarsal phalangeal joint range of motion on right likely significant of functional limitus.  Strength within normal limits in all groups bilateral.   Assessment and Plan: Problem List Items Addressed This Visit      Nervous and Auditory   Lumbar radiculopathy    Other Visit Diagnoses    Right foot pain    -  Primary   Relevant Medications   triamcinolone acetonide (KENALOG) 10 MG/ML injection 10 mg (Completed) (Start on 01/29/2018 11:30 PM)   Neuritis       Relevant Medications   triamcinolone acetonide (KENALOG) 10 MG/ML injection  10 mg (Completed) (Start on 01/29/2018 11:30 PM)   Metatarsalgia, right foot       Relevant Medications   triamcinolone acetonide (KENALOG) 10 MG/ML injection 10 mg (Completed) (Start on 01/29/2018 11:30 PM)   Capsulitis       Relevant Medications   triamcinolone acetonide (KENALOG) 10 MG/ML injection 10  mg (Completed) (Start on 01/29/2018 11:30 PM)       -Complete examination performed -Discussed with patient nerve conduction velocity test results -Discussed treatement options for likely localized neuritis versus capsulitis right foot however I did advise patient that this issue is likely separate from her sharp shooting pain down the leg which could be related to her history of extensive back problems since there is evidence of nerve degeneration consistent with radiculopathy that was revealed on her nerve conduction test -After oral consent and aseptic prep, injected a mixture containing 1 ml of 2%  plain lidocaine, 1 ml 0.5% plain marcaine, 0.5 ml of kenalog 10 and 0.5 ml of dexamethasone phosphate into plantar aspect of the second metatarsophalangeal joint and interspace on the right foot without complication. Post-injection care discussed with patient.  -Rx metatarsal padding and advised patient if this works well would benefit long-term from custom orthotics -Patient to return to office as needed or sooner if condition worsens.  Landis Martins, DPM

## 2018-02-14 ENCOUNTER — Other Ambulatory Visit: Payer: Self-pay | Admitting: Neurosurgery

## 2018-02-14 DIAGNOSIS — M5416 Radiculopathy, lumbar region: Secondary | ICD-10-CM

## 2018-02-18 ENCOUNTER — Ambulatory Visit
Admission: RE | Admit: 2018-02-18 | Discharge: 2018-02-18 | Disposition: A | Discharge status: Home / Self Care | Payer: 59 | Source: Ambulatory Visit | Attending: Neurosurgery | Admitting: Neurosurgery

## 2018-02-18 DIAGNOSIS — M48061 Spinal stenosis, lumbar region without neurogenic claudication: Secondary | ICD-10-CM | POA: Diagnosis not present

## 2018-02-18 DIAGNOSIS — M5416 Radiculopathy, lumbar region: Secondary | ICD-10-CM

## 2018-02-18 MED ORDER — GADOBENATE DIMEGLUMINE 529 MG/ML IV SOLN
14.0000 mL | Freq: Once | INTRAVENOUS | Status: AC | PRN
  Administered 2018-02-18: 14 mL via INTRAVENOUS

## 2018-02-20 ENCOUNTER — Other Ambulatory Visit: Payer: Self-pay | Admitting: Internal Medicine

## 2018-05-03 ENCOUNTER — Ambulatory Visit: Payer: Self-pay | Admitting: *Deleted

## 2018-05-03 NOTE — Telephone Encounter (Signed)
Returned call to pt after receiving voicemail message on having a sore throat, diarrhea, headache and concerns that these symptoms could be related to the COVID-19. Pt states that on 3/11, her stomach felt really full like I was gong to explode after eating ribs and a baked potato at a restaurant. Pt states she did experience diarrhea next morning but was constipated for the next few days with abdominal pain. Pt has a history of diarrhea. Pt states that this past Monday she started to have a sore throat and diarrhea again for the last 2 days. Pt states she was working at [employer] and everyone in the office is from out of the country one man that sits near her is from [place] and had not been out of the country. Diarrhea in the past and was taking aspirin stopped taking in aspirin. Pain on left side after eating. Pt given home care advice regarding COVID-19 treatment, symptoms and testing. Pt verbalized understanding. Pt states she will return call to office if symptoms become worse.   Reason for Disposition . [1] No COVID-19 EXPOSURE BUT [2] questions about  Protocols used: CORONAVIRUS (COVID-19) EXPOSURE-A-AH

## 2018-05-19 ENCOUNTER — Other Ambulatory Visit: Payer: Self-pay | Admitting: Internal Medicine

## 2018-07-01 NOTE — Patient Instructions (Addendum)
Tests ordered today. Your results will be released to MyChart (or called to you) after review, usually within 72hours after test completion. If any changes need to be made, you will be notified at that same time.  All other Health Maintenance issues reviewed.   All recommended immunizations and age-appropriate screenings are up-to-date or discussed.  Tetanus immunizations administered today.   Medications reviewed and updated.  Changes include :  none    Please followup in one year   Health Maintenance, Female Adopting a healthy lifestyle and getting preventive care can go a long way to promote health and wellness. Talk with your health care provider about what schedule of regular examinations is right for you. This is a good chance for you to check in with your provider about disease prevention and staying healthy. In between checkups, there are plenty of things you can do on your own. Experts have done a lot of research about which lifestyle changes and preventive measures are most likely to keep you healthy. Ask your health care provider for more information. Weight and diet Eat a healthy diet  Be sure to include plenty of vegetables, fruits, low-fat dairy products, and lean protein.  Do not eat a lot of foods high in solid fats, added sugars, or salt.  Get regular exercise. This is one of the most important things you can do for your health. ? Most adults should exercise for at least 150 minutes each week. The exercise should increase your heart rate and make you sweat (moderate-intensity exercise). ? Most adults should also do strengthening exercises at least twice a week. This is in addition to the moderate-intensity exercise. Maintain a healthy weight  Body mass index (BMI) is a measurement that can be used to identify possible weight problems. It estimates body fat based on height and weight. Your health care provider can help determine your BMI and help you achieve or maintain a  healthy weight.  For females 20 years of age and older: ? A BMI below 18.5 is considered underweight. ? A BMI of 18.5 to 24.9 is normal. ? A BMI of 25 to 29.9 is considered overweight. ? A BMI of 30 and above is considered obese. Watch levels of cholesterol and blood lipids  You should start having your blood tested for lipids and cholesterol at 59 years of age, then have this test every 5 years.  You may need to have your cholesterol levels checked more often if: ? Your lipid or cholesterol levels are high. ? You are older than 59 years of age. ? You are at high risk for heart disease. Cancer screening Lung Cancer  Lung cancer screening is recommended for adults 55-80 years old who are at high risk for lung cancer because of a history of smoking.  A yearly low-dose CT scan of the lungs is recommended for people who: ? Currently smoke. ? Have quit within the past 15 years. ? Have at least a 30-pack-year history of smoking. A pack year is smoking an average of one pack of cigarettes a day for 1 year.  Yearly screening should continue until it has been 15 years since you quit.  Yearly screening should stop if you develop a health problem that would prevent you from having lung cancer treatment. Breast Cancer  Practice breast self-awareness. This means understanding how your breasts normally appear and feel.  It also means doing regular breast self-exams. Let your health care provider know about any changes, no matter how small.    If you are in your 20s or 30s, you should have a clinical breast exam (CBE) by a health care provider every 1-3 years as part of a regular health exam.  If you are 40 or older, have a CBE every year. Also consider having a breast X-ray (mammogram) every year.  If you have a family history of breast cancer, talk to your health care provider about genetic screening.  If you are at high risk for breast cancer, talk to your health care provider about having  an MRI and a mammogram every year.  Breast cancer gene (BRCA) assessment is recommended for women who have family members with BRCA-related cancers. BRCA-related cancers include: ? Breast. ? Ovarian. ? Tubal. ? Peritoneal cancers.  Results of the assessment will determine the need for genetic counseling and BRCA1 and BRCA2 testing. Cervical Cancer Your health care provider may recommend that you be screened regularly for cancer of the pelvic organs (ovaries, uterus, and vagina). This screening involves a pelvic examination, including checking for microscopic changes to the surface of your cervix (Pap test). You may be encouraged to have this screening done every 3 years, beginning at age 21.  For women ages 30-65, health care providers may recommend pelvic exams and Pap testing every 3 years, or they may recommend the Pap and pelvic exam, combined with testing for human papilloma virus (HPV), every 5 years. Some types of HPV increase your risk of cervical cancer. Testing for HPV may also be done on women of any age with unclear Pap test results.  Other health care providers may not recommend any screening for nonpregnant women who are considered low risk for pelvic cancer and who do not have symptoms. Ask your health care provider if a screening pelvic exam is right for you.  If you have had past treatment for cervical cancer or a condition that could lead to cancer, you need Pap tests and screening for cancer for at least 20 years after your treatment. If Pap tests have been discontinued, your risk factors (such as having a new sexual partner) need to be reassessed to determine if screening should resume. Some women have medical problems that increase the chance of getting cervical cancer. In these cases, your health care provider may recommend more frequent screening and Pap tests. Colorectal Cancer  This type of cancer can be detected and often prevented.  Routine colorectal cancer screening  usually begins at 59 years of age and continues through 59 years of age.  Your health care provider may recommend screening at an earlier age if you have risk factors for colon cancer.  Your health care provider may also recommend using home test kits to check for hidden blood in the stool.  A small camera at the end of a tube can be used to examine your colon directly (sigmoidoscopy or colonoscopy). This is done to check for the earliest forms of colorectal cancer.  Routine screening usually begins at age 50.  Direct examination of the colon should be repeated every 5-10 years through 59 years of age. However, you may need to be screened more often if early forms of precancerous polyps or small growths are found. Skin Cancer  Check your skin from head to toe regularly.  Tell your health care provider about any new moles or changes in moles, especially if there is a change in a mole's shape or color.  Also tell your health care provider if you have a mole that is larger than the   size of a pencil eraser.  Always use sunscreen. Apply sunscreen liberally and repeatedly throughout the day.  Protect yourself by wearing long sleeves, pants, a wide-brimmed hat, and sunglasses whenever you are outside. Heart disease, diabetes, and high blood pressure  High blood pressure causes heart disease and increases the risk of stroke. High blood pressure is more likely to develop in: ? People who have blood pressure in the high end of the normal range (130-139/85-89 mm Hg). ? People who are overweight or obese. ? People who are African American.  If you are 18-39 years of age, have your blood pressure checked every 3-5 years. If you are 40 years of age or older, have your blood pressure checked every year. You should have your blood pressure measured twice-once when you are at a hospital or clinic, and once when you are not at a hospital or clinic. Record the average of the two measurements. To check your  blood pressure when you are not at a hospital or clinic, you can use: ? An automated blood pressure machine at a pharmacy. ? A home blood pressure monitor.  If you are between 55 years and 79 years old, ask your health care provider if you should take aspirin to prevent strokes.  Have regular diabetes screenings. This involves taking a blood sample to check your fasting blood sugar level. ? If you are at a normal weight and have a low risk for diabetes, have this test once every three years after 59 years of age. ? If you are overweight and have a high risk for diabetes, consider being tested at a younger age or more often. Preventing infection Hepatitis B  If you have a higher risk for hepatitis B, you should be screened for this virus. You are considered at high risk for hepatitis B if: ? You were born in a country where hepatitis B is common. Ask your health care provider which countries are considered high risk. ? Your parents were born in a high-risk country, and you have not been immunized against hepatitis B (hepatitis B vaccine). ? You have HIV or AIDS. ? You use needles to inject street drugs. ? You live with someone who has hepatitis B. ? You have had sex with someone who has hepatitis B. ? You get hemodialysis treatment. ? You take certain medicines for conditions, including cancer, organ transplantation, and autoimmune conditions. Hepatitis C  Blood testing is recommended for: ? Everyone born from 1945 through 1965. ? Anyone with known risk factors for hepatitis C. Sexually transmitted infections (STIs)  You should be screened for sexually transmitted infections (STIs) including gonorrhea and chlamydia if: ? You are sexually active and are younger than 59 years of age. ? You are older than 59 years of age and your health care provider tells you that you are at risk for this type of infection. ? Your sexual activity has changed since you were last screened and you are at an  increased risk for chlamydia or gonorrhea. Ask your health care provider if you are at risk.  If you do not have HIV, but are at risk, it may be recommended that you take a prescription medicine daily to prevent HIV infection. This is called pre-exposure prophylaxis (PrEP). You are considered at risk if: ? You are sexually active and do not regularly use condoms or know the HIV status of your partner(s). ? You take drugs by injection. ? You are sexually active with a partner who has HIV.   Talk with your health care provider about whether you are at high risk of being infected with HIV. If you choose to begin PrEP, you should first be tested for HIV. You should then be tested every 3 months for as long as you are taking PrEP. Pregnancy  If you are premenopausal and you may become pregnant, ask your health care provider about preconception counseling.  If you may become pregnant, take 400 to 800 micrograms (mcg) of folic acid every day.  If you want to prevent pregnancy, talk to your health care provider about birth control (contraception). Osteoporosis and menopause  Osteoporosis is a disease in which the bones lose minerals and strength with aging. This can result in serious bone fractures. Your risk for osteoporosis can be identified using a bone density scan.  If you are 65 years of age or older, or if you are at risk for osteoporosis and fractures, ask your health care provider if you should be screened.  Ask your health care provider whether you should take a calcium or vitamin D supplement to lower your risk for osteoporosis.  Menopause may have certain physical symptoms and risks.  Hormone replacement therapy may reduce some of these symptoms and risks. Talk to your health care provider about whether hormone replacement therapy is right for you. Follow these instructions at home:  Schedule regular health, dental, and eye exams.  Stay current with your immunizations.  Do not use  any tobacco products including cigarettes, chewing tobacco, or electronic cigarettes.  If you are pregnant, do not drink alcohol.  If you are breastfeeding, limit how much and how often you drink alcohol.  Limit alcohol intake to no more than 1 drink per day for nonpregnant women. One drink equals 12 ounces of beer, 5 ounces of wine, or 1 ounces of hard liquor.  Do not use street drugs.  Do not share needles.  Ask your health care provider for help if you need support or information about quitting drugs.  Tell your health care provider if you often feel depressed.  Tell your health care provider if you have ever been abused or do not feel safe at home. This information is not intended to replace advice given to you by your health care provider. Make sure you discuss any questions you have with your health care provider. Document Released: 08/15/2010 Document Revised: 07/08/2015 Document Reviewed: 11/03/2014 Elsevier Interactive Patient Education  2019 [employer]  

## 2018-07-01 NOTE — Progress Notes (Signed)
Subjective:    Patient ID: Whitney Blake, female    DOB: 1960-09-08, 58 y.o.   MRN: [id number]  HPI She is here for a physical exam.   She denies changes in her history since she was here.    Left leg cramping: The past few months she gets intermittent cramping in her entire right leg.  She had back surgery - 06/12/16.  Her surgeon did an EMG and said it was not from her back.  He also did an MRI and it was ok. She had foot surgery for a neuroma and also saw podiatrist and had an injection in her foot which did not help.   She only has cramping at night and she thinks it usually occurs when she moves her foot in a certain way.    Right cervical LN - it is intermittently swollen and tender.  It has occurred w/o any cold symptoms or allergies.  Right now it is fine.    She has a area of bruising or discoloration in her left mid abdomen for a few months.  She thought it was a bruise initially but just never went away.  She denies trauma to the area.  She denies any pain.    Medications and allergies reviewed with patient and updated if appropriate.  Patient Active Problem List   Diagnosis Date Noted  . Leg cramping 07/02/2018  . Poison ivy dermatitis 07/02/2018  . Unspecified dyspareunia (CODE) 12/11/2017  . Lichen sclerosus of female genitalia 05/04/2017  . Vitamin D deficiency 05/03/2017  . Lumbar radiculopathy 05/15/2016  . Prediabetes 04/29/2016  . Breast pain, left 04/27/2016  . Leg pain, bilateral 04/23/2015  . Nephrolithiasis 09/06/2012  . Essential hypertension 12/07/2011  . Sleep disorder 05/25/2009  . Migraine 12/21/2008  . Postmenopausal 12/21/2008  . Anemia 09/03/2007  . NONSPECIFIC ABNORMAL ELECTROCARDIOGRAM 09/03/2007  . SPINAL STENOSIS 06/14/2006    Current Outpatient Medications on File Prior to Visit  Medication Sig Dispense Refill  . cholecalciferol (VITAMIN D) 1000 units tablet Take 2,000 Units by mouth daily.    [name] telmisartan-hydrochlorothiazide (MICARDIS  HCT) 40-12.5 MG tablet TAKE 1 TABLET BY MOUTH DAILY. -- OFFICE VISIT NEEDED FOR FURTHER REFILLS 90 tablet 0   No current facility-administered medications on file prior to visit.     Past Medical History:  Diagnosis Date  . Anemia    [facility]  . Cervical spinal stenosis    Dr Arnoldo Morale, [place]  . Hypertension   . Internal hemorrhoids   . Migraines    menstrual  . Nonspecific ST-T changes 2007  . Normal nuclear stress test 2007    Past Surgical History:  Procedure Laterality Date  . COLONOSCOPY  2012   neg; [facility]  . CONCUSSION     DUE TO FALL AT AGE 59  . FOOT SURGERY     Right   . LUMBAR LAMINECTOMY  2003   FOR RUPTURED DISC  . ORIF CLAVICULAR FRACTURE     bilaterally @ age 89  . TONSILLECTOMY AND ADENOIDECTOMY      Social History   Socioeconomic History  . Marital status: Married    Spouse name: Not on file  . Number of children: 2  . Years of education: Not on file  . Highest education level: Not on file  Occupational History  . Occupation: Chemical engineer: Editor, commissioning  Social Needs  . Financial resource strain: Not on file  . Food insecurity:  Worry: Not on file    Inability: Not on file  . Transportation needs:    Medical: Not on file    Non-medical: Not on file  Tobacco Use  . Smoking status: Never Smoker  . Smokeless tobacco: Never Used  Substance and Sexual Activity  . Alcohol use: No  . Drug use: No  . Sexual activity: Not on file  Lifestyle  . Physical activity:    Days per week: Not on file    Minutes per session: Not on file  . Stress: Not on file  Relationships  . Social connections:    Talks on phone: Not on file    Gets together: Not on file    Attends religious service: Not on file    Active member of club or organization: Not on file    Attends meetings of clubs or organizations: Not on file    Relationship status: Not on file  Other Topics Concern  . Not on file  Social History Narrative   Exercises 3 a week     Daily caffeine     Family History  Problem Relation Age of Onset  . Hypertension Mother        bilateral endarterectomies,pacer,mesenteric artery bypass, renal artery stenosis stented unilaterally, intestinal perforation, steroids for colitis induced osteoprosis, polyps   . Cancer Mother        uterine S/P Tamoxifen  . Transient ischemic attack Mother        in early 68's  . Colon polyps Mother   . Irritable bowel syndrome Mother   . Breast cancer Mother   . Heart disease Mother        pacer  . Asthma Father   . Hypertension Father   . Colon polyps Father   . Heart disease Father        S/P ablation for AF  . Stroke Father        cns bleed on coumadin  . Hypertension Sister        2 sisters  . Cancer Maternal Grandmother        uterine  . Diabetes Maternal Grandmother   . Cancer Sister        cervical  . Colon cancer Neg Hx     Review of Systems  Constitutional: Negative for chills and fever.  Respiratory: Negative for cough, shortness of breath and wheezing.   Cardiovascular: Negative for chest pain, palpitations and leg swelling.  Gastrointestinal: Negative for abdominal pain, blood in stool, constipation, diarrhea and nausea.       No gerd  Genitourinary: Negative for dysuria and hematuria.  Musculoskeletal: Negative for arthralgias and back pain.  Skin: Positive for color change and rash.  Neurological: Negative for light-headedness and headaches.  Psychiatric/Behavioral: Negative for dysphoric mood. The patient is not nervous/anxious.        Objective:   Vitals:   07/02/18 0904  BP: 126/80  Pulse: (!) 59  Resp: 16  Temp: 98.2 F (36.8 C)  SpO2: 99%   Filed Weights   07/02/18 0904  Weight: 176 lb 12.8 oz (80.2 kg)   Body mass index is 28.54 kg/m.  BP Readings from Last 3 Encounters:  07/02/18 126/80  12/18/17 (!) 150/85  05/04/17 128/80    Wt Readings from Last 3 Encounters:  07/02/18 176 lb 12.8 oz (80.2 kg)  05/04/17 181 lb (82.1 kg)   12/19/16 176 lb (79.8 kg)     Physical Exam Constitutional: She appears well-developed and well-nourished. No  distress.  HENT:  Head: Normocephalic and atraumatic.  Right Ear: External ear normal. Normal ear canal and TM Left Ear: External ear normal.  Normal ear canal and TM Mouth/Throat: Oropharynx is clear and moist.  Eyes: Conjunctivae and EOM are normal.  Neck: Neck supple. No tracheal deviation present. No thyromegaly present. No carotid bruit  Cardiovascular: Normal rate, regular rhythm and normal heart sounds.   No murmur heard.  No edema. Pulmonary/Chest: Effort normal and breath sounds normal. No respiratory distress. She has no wheezes. She has no rales.  Breast: deferred   Abdominal: Soft. She exhibits no distension. There is no tenderness.  Lymphadenopathy: She has no cervical adenopathy.  Skin: Skin is warm and dry. She is not diaphoretic.  Area of mild hyperpigmentation left mid abdomen-nontender. Psychiatric: She has a normal mood and affect. Her behavior is normal.        Assessment & Plan:   Physical exam: Screening blood work ordered Immunizations  Discussed tdap and shingrix - tdap today Colonoscopy   Up to date  Mammogram   Up to date  Gyn   Up to date  Dexa   Up to date  Eye exams   Up to date  Exercise  regular - 5 /week Weight   Overweight  - ok for age Skin  discoloration in abdomen.  Substance abuse   none  See Problem List for Assessment and Plan of chronic medical problems.  FU in one year

## 2018-07-02 ENCOUNTER — Encounter: Payer: Self-pay | Admitting: Internal Medicine

## 2018-07-02 ENCOUNTER — Other Ambulatory Visit (INDEPENDENT_AMBULATORY_CARE_PROVIDER_SITE_OTHER): Payer: 59

## 2018-07-02 ENCOUNTER — Ambulatory Visit (INDEPENDENT_AMBULATORY_CARE_PROVIDER_SITE_OTHER): Payer: 59 | Admitting: Internal Medicine

## 2018-07-02 ENCOUNTER — Other Ambulatory Visit: Payer: Self-pay

## 2018-07-02 VITALS — BP 126/80 | HR 59 | Temp 98.2°F | Resp 16 | Ht 66.0 in | Wt 176.8 lb

## 2018-07-02 DIAGNOSIS — Z0000 Encounter for general adult medical examination without abnormal findings: Secondary | ICD-10-CM

## 2018-07-02 DIAGNOSIS — I10 Essential (primary) hypertension: Secondary | ICD-10-CM

## 2018-07-02 DIAGNOSIS — Z23 Encounter for immunization: Secondary | ICD-10-CM

## 2018-07-02 DIAGNOSIS — R252 Cramp and spasm: Secondary | ICD-10-CM

## 2018-07-02 DIAGNOSIS — R7303 Prediabetes: Secondary | ICD-10-CM

## 2018-07-02 DIAGNOSIS — L237 Allergic contact dermatitis due to plants, except food: Secondary | ICD-10-CM | POA: Insufficient documentation

## 2018-07-02 DIAGNOSIS — L819 Disorder of pigmentation, unspecified: Secondary | ICD-10-CM | POA: Insufficient documentation

## 2018-07-02 LAB — CBC WITH DIFFERENTIAL/PLATELET
Basophils Absolute: 0 10*3/uL (ref 0.0–0.1)
Basophils Relative: 0.7 % (ref 0.0–3.0)
Eosinophils Absolute: 0.1 10*3/uL (ref 0.0–0.7)
Eosinophils Relative: 1.9 % (ref 0.0–5.0)
HCT: 42.3 % (ref 36.0–46.0)
Hemoglobin: 14.2 g/dL (ref 12.0–15.0)
Lymphocytes Relative: 39.6 % (ref 12.0–46.0)
Lymphs Abs: 2.5 10*3/uL (ref 0.7–4.0)
MCHC: 33.7 g/dL (ref 30.0–36.0)
MCV: 88.3 fl (ref 78.0–100.0)
Monocytes Absolute: 0.4 10*3/uL (ref 0.1–1.0)
Monocytes Relative: 6.9 % (ref 3.0–12.0)
Neutro Abs: 3.2 10*3/uL (ref 1.4–7.7)
Neutrophils Relative %: 50.9 % (ref 43.0–77.0)
Platelets: 244 10*3/uL (ref 150.0–400.0)
RBC: 4.79 Mil/uL (ref 3.87–5.11)
RDW: 14.6 % (ref 11.5–15.5)
WBC: 6.2 10*3/uL (ref 4.0–10.5)

## 2018-07-02 LAB — LIPID PANEL
Cholesterol: 213 mg/dL — ABNORMAL HIGH (ref 0–200)
HDL: 56 mg/dL (ref >39.00)
LDL Cholesterol: 127 mg/dL — ABNORMAL HIGH (ref 0–99)
NonHDL: 156.76
Total CHOL/HDL Ratio: 4
Triglycerides: 150 mg/dL — ABNORMAL HIGH (ref 0.0–149.0)
VLDL: 30 mg/dL (ref 0.0–40.0)

## 2018-07-02 LAB — COMPREHENSIVE METABOLIC PANEL
ALT: 21 U/L (ref 0–35)
AST: 17 U/L (ref 0–37)
Albumin: 4.2 g/dL (ref 3.5–5.2)
Alkaline Phosphatase: 53 U/L (ref 39–117)
BUN: 15 mg/dL (ref 6–23)
CO2: 30 mEq/L (ref 19–32)
Calcium: 9.1 mg/dL (ref 8.4–10.5)
Chloride: 102 mEq/L (ref 96–112)
Creatinine, Ser: 0.95 mg/dL (ref 0.40–1.20)
GFR: 60.22 mL/min (ref >60.00)
Glucose, Bld: 89 mg/dL (ref 70–99)
Potassium: 3.8 mEq/L (ref 3.5–5.1)
Sodium: 139 mEq/L (ref 135–145)
Total Bilirubin: 0.5 mg/dL (ref 0.2–1.2)
Total Protein: 7.1 g/dL (ref 6.0–8.3)

## 2018-07-02 LAB — HEMOGLOBIN A1C: Hgb A1c MFr Bld: 5.8 % (ref 4.6–6.5)

## 2018-07-02 LAB — TSH: TSH: 2.99 u[IU]/mL (ref 0.35–4.50)

## 2018-07-02 NOTE — Assessment & Plan Note (Signed)
Entire left leg cramps at night Often related to certain movement or position of foot and lower leg Saw her surgeon that did her back surgery-EMG done and he did not feel that the cramping was related to her back She also had foot surgery and did see the podiatrist ?  Related to muscle tightness in the left leg-can try stretching Will refer to sports medicine to see if there is any other imbalance, muscle tightness that could be the potential cause

## 2018-07-02 NOTE — Assessment & Plan Note (Signed)
Area on left abdomen-unsure of cause Does not look like skin cancer or concerning rash She will just monitor for now

## 2018-07-02 NOTE — Assessment & Plan Note (Signed)
Check a1c Low sugar / carb diet Stressed regular exercise   

## 2018-07-02 NOTE — Assessment & Plan Note (Signed)
BP well controlled Current regimen effective and well tolerated Continue current medications at current doses cmp  

## 2018-07-11 NOTE — Progress Notes (Signed)
[facility] [address] [place], [name] [address] Phone: [phone] Subjective:   Fontaine No, am serving as a scribe for Dr. Hulan Saas.  I'm seeing this patient by the request  of:  Binnie Rail, MD   CC: Back pain  [id number]  Whitney Blake is a 58 y.o. female coming in with complaint of back pain. Had foot surgery 10 years ago and suffered a ruptured disc 2 years ago. Had surgery for back as well. For past 2 months patient has been having cramps in the right leg. Occurs when lying supine and when she internally rotates her hip. Calf will begin to tingle and she will feel a cramp from her great toe around the lateral malleolus and into the hamstring and glute. Has to get out of bed and walk around and stretch to alleviate her pain. Does use the bowflex 5 days a week but does not have pain with activity. Also notes that she has a calus under 1st ray due to an abnormal gait from her back and the pain she has. Has had nerve conduction test in 2019 and MRI of lumbar spine 2020.  MRI was independently visualized by me.  I do believe that there is significant discogenic changes that could be contributing to the patient's back pain.  Patient does not have true nerve impingement but does seem to get fairly close to the L5 nerve root on the right side.  Mild impingement noted in the left side but patient has been asymptomatic on that side.      Past Medical History:  Diagnosis Date  . Anemia    PMH of  . Cervical spinal stenosis    Dr Arnoldo Morale, [place]  . Hypertension   . Internal hemorrhoids   . Migraines    menstrual  . Nonspecific ST-T changes 2007  . Normal nuclear stress test 2007   Past Surgical History:  Procedure Laterality Date  . COLONOSCOPY  2012   neg; [facility]  . CONCUSSION     DUE TO FALL AT AGE 37  . FOOT SURGERY     Right   . LUMBAR LAMINECTOMY  2003   FOR RUPTURED DISC  . ORIF CLAVICULAR FRACTURE     bilaterally @ age 78  .  TONSILLECTOMY AND ADENOIDECTOMY     Social History   Socioeconomic History  . Marital status: Married    Spouse name: Not on file  . Number of children: 2  . Years of education: Not on file  . Highest education level: Not on file  Occupational History  . Occupation: Chemical engineer: Editor, commissioning  Social Needs  . Financial resource strain: Not on file  . Food insecurity:    Worry: Not on file    Inability: Not on file  . Transportation needs:    Medical: Not on file    Non-medical: Not on file  Tobacco Use  . Smoking status: Never Smoker  . Smokeless tobacco: Never Used  Substance and Sexual Activity  . Alcohol use: No  . Drug use: No  . Sexual activity: Not on file  Lifestyle  . Physical activity:    Days per week: Not on file    Minutes per session: Not on file  . Stress: Not on file  Relationships  . Social connections:    Talks on phone: Not on file    Gets together: Not on file  Attends religious service: Not on file    Active member of club or organization: Not on file    Attends meetings of clubs or organizations: Not on file    Relationship status: Not on file  Other Topics Concern  . Not on file  Social History Narrative   Exercises 3 a week    Daily caffeine    Allergies  Allergen Reactions  . Rofecoxib Swelling    Vioxx caused  swelling of hands& feet  . Coreg [Carvedilol] Other (See Comments)    Caused hot flashes   Family History  Problem Relation Age of Onset  . Hypertension Mother        bilateral endarterectomies,pacer,mesenteric artery bypass, renal artery stenosis stented unilaterally, intestinal perforation, steroids for colitis induced osteoprosis, polyps   . Cancer Mother        uterine S/P Tamoxifen  . Transient ischemic attack Mother        in early 51's  . Colon polyps Mother   . Irritable bowel syndrome Mother   . Breast cancer Mother   . Heart disease Mother        pacer  . Asthma Father   . Hypertension  Father   . Colon polyps Father   . Heart disease Father        S/P ablation for AF  . Stroke Father        cns bleed on coumadin  . Hypertension Sister        2 sisters  . Cancer Maternal Grandmother        uterine  . Diabetes Maternal Grandmother   . Cancer Sister        cervical  . Colon cancer Neg Hx      Current Outpatient Medications (Cardiovascular):  .  telmisartan-hydrochlorothiazide (MICARDIS HCT) 40-12.5 MG tablet, TAKE 1 TABLET BY MOUTH DAILY. -- OFFICE VISIT NEEDED FOR FURTHER REFILLS     Current Outpatient Medications (Other):  .  cholecalciferol (VITAMIN D) 1000 units tablet, Take 2,000 Units by mouth daily.    Past medical history, social, surgical and family history all reviewed in electronic medical record.  No pertanent information unless stated regarding to the chief complaint.   Review of Systems:  No headache, visual changes, nausea, vomiting, diarrhea, constipation, dizziness, abdominal pain, skin rash, fevers, chills, night sweats, weight loss, swollen lymph nodes, body aches, joint swelling, muscle aches, chest pain, shortness of breath, mood changes.  Positive muscle aches  Objective  Blood pressure 130/90, pulse 62, height 5\' 6"  (1.676 m), weight 176 lb (79.8 kg), last menstrual period 02/14/2012, SpO2 98 %.   General: No apparent distress alert and oriented x3 mood and affect normal, dressed appropriately.  HEENT: Pupils equal, extraocular movements intact  Respiratory: Patient's speak in full sentences and does not appear short of breath  Cardiovascular: No lower extremity edema, non tender, no erythema  Skin: Warm dry intact with no signs of infection or rash on extremities or on axial skeleton.  Abdomen: Soft nontender  Neuro: Cranial nerves II through XII are intact, neurovascularly intact in all extremities with 2+ DTRs and 2+ pulses.  Lymph: No lymphadenopathy of posterior or anterior cervical chain or axillae bilaterally.  Gait mild  antalgic MSK:  Non tender with full range of motion and good stability and symmetric strength and tone of shoulders, elbows, wrist, hip, knee and ankles bilaterally.  Back exam shows the patient does have some loss of lordosis.  Postsurgical changes noted of the skin.  He seems well-healed. Significant tightness with straight test on the right side with mild radicular symptoms.  Patient does have a mild positive Corky Sox on the right side with some tenderness over the piriformis.  Right sacroiliac joint minorly tender as well.  Patient's tender to palpation diffusely on the right side of the lumbar spine. Right foot does have postsurgical changes noted.  Patient does have breakdown the longitudinal and transverse arch.  Patient has hallux limitus secondary to surgical intervention.  [id number]; 15 additional minutes spent for Therapeutic exercises as stated in above notes.  This included exercises focusing on stretching, strengthening, with significant focus on eccentric aspects.   Long term goals include an improvement in range of motion, strength, endurance as well as avoiding reinjury. Patient's frequency would include in 1-2 times a day, 3-5 times a week for a duration of 6-12 weeks. Low back exercises that included:  Pelvic tilt/bracing instruction to focus on control of the pelvic girdle and lower abdominal muscles  Glute strengthening exercises, focusing on proper firing of the glutes without engaging the low back muscles Proper stretching techniques for maximum relief for the hamstrings, hip flexors, low back and some rotation where tolerated   Proper technique shown and discussed handout in great detail with ATC.  All questions were discussed and answered.     Impression and Recommendations:     This case required medical decision making of moderate complexity. The above documentation has been reviewed and is accurate and complete Lyndal Pulley, DO       Note: This dictation was prepared  with Dragon dictation along with smaller phrase technology. Any transcriptional errors that result from this process are unintentional.

## 2018-07-12 ENCOUNTER — Ambulatory Visit (INDEPENDENT_AMBULATORY_CARE_PROVIDER_SITE_OTHER): Payer: 59 | Admitting: Family Medicine

## 2018-07-12 ENCOUNTER — Other Ambulatory Visit: Payer: Self-pay

## 2018-07-12 ENCOUNTER — Encounter: Payer: Self-pay | Admitting: Family Medicine

## 2018-07-12 VITALS — BP 130/90 | HR 62 | Ht 66.0 in | Wt 176.0 lb

## 2018-07-12 DIAGNOSIS — M5416 Radiculopathy, lumbar region: Secondary | ICD-10-CM

## 2018-07-12 NOTE — Assessment & Plan Note (Signed)
I believe that patient is having pain.  I am concerned for a right L5 nerve root impingement on the right side.  This is consistent with patient's symptoms.  I believe the patient's foot pain could have been secondary to more of the back.  I do think that an epidural could be diagnostic and therapeutic.  Patient declined multiple different medications we discussed and patient states that she has been on that for quite some time.  We discussed over-the-counter natural supplementations that could be good.  We discussed icing regimen.  Work with Product/process development scientist to learn home exercises in greater detail.  Patient will follow-up with me again in 4 weeks

## 2018-07-12 NOTE — Patient Instructions (Signed)
Great to see you  Ice 20 minutes 2 times daily. Usually after activity and before bed. Exercises 3 times a week.  B12 1058mcg daily  B6 100mg  daily  Epidural ordered  See me again in 4 weeks

## 2018-07-29 ENCOUNTER — Ambulatory Visit
Admission: RE | Admit: 2018-07-29 | Discharge: 2018-07-29 | Disposition: A | Discharge status: Home / Self Care | Payer: 59 | Source: Ambulatory Visit | Attending: Family Medicine | Admitting: Family Medicine

## 2018-07-29 ENCOUNTER — Other Ambulatory Visit: Payer: Self-pay | Admitting: Family Medicine

## 2018-07-29 ENCOUNTER — Other Ambulatory Visit: Payer: Self-pay

## 2018-07-29 DIAGNOSIS — M5416 Radiculopathy, lumbar region: Secondary | ICD-10-CM

## 2018-07-29 MED ORDER — IOPAMIDOL (ISOVUE-M 200) INJECTION 41%
1.0000 mL | Freq: Once | INTRAMUSCULAR | Status: AC
  Administered 2018-07-29: 08:00:00 1 mL via EPIDURAL

## 2018-07-29 MED ORDER — METHYLPREDNISOLONE ACETATE 40 MG/ML INJ SUSP (RADIOLOG
120.0000 mg | Freq: Once | INTRAMUSCULAR | Status: AC
  Administered 2018-07-29: 120 mg via EPIDURAL

## 2018-07-29 NOTE — Discharge Instructions (Signed)

## 2018-07-30 LAB — HM PAP SMEAR

## 2018-08-12 ENCOUNTER — Encounter: Payer: Self-pay | Admitting: Internal Medicine

## 2018-08-13 ENCOUNTER — Encounter: Payer: Self-pay | Admitting: Internal Medicine

## 2018-08-13 ENCOUNTER — Ambulatory Visit: Payer: Self-pay | Admitting: *Deleted

## 2018-08-13 ENCOUNTER — Telehealth: Payer: Self-pay | Admitting: *Deleted

## 2018-08-13 ENCOUNTER — Ambulatory Visit (INDEPENDENT_AMBULATORY_CARE_PROVIDER_SITE_OTHER): Payer: 59 | Admitting: Internal Medicine

## 2018-08-13 DIAGNOSIS — Z20828 Contact with and (suspected) exposure to other viral communicable diseases: Secondary | ICD-10-CM | POA: Diagnosis not present

## 2018-08-13 DIAGNOSIS — R6889 Other general symptoms and signs: Secondary | ICD-10-CM | POA: Diagnosis not present

## 2018-08-13 DIAGNOSIS — Z20822 Contact with and (suspected) exposure to covid-19: Secondary | ICD-10-CM

## 2018-08-13 NOTE — Progress Notes (Signed)
Virtual Visit via Video Note  I connected with Whitney Blake on 08/13/18 at 11:00 AM EDT by a video enabled telemedicine application and verified that I am speaking with the correct person using two identifiers.  The patient and the provider were at separate locations throughout the entire encounter.   I discussed the limitations of evaluation and management by telemedicine and the availability of in person appointments. The patient expressed understanding and agreed to proceed.  History of Present Illness: The patient is a 57 y.o. female with visit for covid-19 symptoms. Went on trip to [facility] and started feeling sick after she arrived. She has had fevers, diarrhea, loss of smell, congestion and body aches. Started about 1 week ago. Has been worsening. Denies SOB. Overall it is not improving. Has tried tylenol which helped with fevers. She is still feeling poorly. She had been quarantining but about 3 weeks ago started loosening this and went to a wedding shower, then the following weekend went to an outdoor wedding (they wore masks but not all in attendence did) and then last weekend went to [facility]. She is concerned because some family members that they were around have started getting sick since her illness started.   Observations/Objective: Appearance: normal, breathing appears normal, casual grooming, abdomen does not appear distended, throat not examined well due to quality, appears sick, mental status is A and O times 3  Assessment and Plan: See problem oriented charting  Follow Up Instructions: needs covid-19 testing, asked to quarantine until symptom free then wait an additional 3 days  Visit time 25 minutes: greater than 50% of that time was spent in face to face counseling and coordination of care with the patient: counseled about likely coronavirus infection and treatment as well as course and signs and symptoms to watch for as well as quarantine and need to maintain strict  quarantine as well as consideration of notification of contacts if testing returns positive  I discussed the assessment and treatment plan with the patient. The patient was provided an opportunity to ask questions and all were answered. The patient agreed with the plan and demonstrated an understanding of the instructions.   The patient was advised to call back or seek an in-person evaluation if the symptoms worsen or if the condition fails to improve as anticipated.  Hoyt Koch, MD

## 2018-08-13 NOTE — Telephone Encounter (Signed)
appt scheduled

## 2018-08-13 NOTE — Telephone Encounter (Signed)
Scheduled patient for [place] tomorrow at 8:45 am.  Testing protocol reviewed.

## 2018-08-13 NOTE — Assessment & Plan Note (Signed)
Testing ordered. Advised to self-quarantine and the timeline for this. Advised on symptom management as well as approach if her symptoms or breathing worsen.

## 2018-08-13 NOTE — Telephone Encounter (Signed)
-----   Message from Cresenciano Lick, [place] sent at 08/13/2018 11:06 AM EDT ----- Dr. Sharlet Salina wants patient tested for loss of taste, fever, diarrhea, congestion. Thanks! SS/CMA

## 2018-08-13 NOTE — Telephone Encounter (Signed)
Pt called with stating she has been sick with diarrhea for about a week, fever about 2 days ago, congestion  in nose, loss of taste and smell. And also body aches. She went to [facility] but the symptoms started the day after she had gotten there. She feels somewhat better today. Temp max 99.3.  She took Tylenol and started sweating and now the fever is gone. She denies shortness of breath. She is requesting to be tested for covid-19. Advised of having a virtual appointment, she voiced understanding. [facility] notified for an appointment, and call transferred to them. Routing triage to the practice.  Reason for Disposition . [1] COVID-19 infection suspected by caller or triager AND [2] mild symptoms (cough, fever, or others) AND [0] no complications or SOB  Answer Assessment - Initial Assessment Questions 1. COVID-19 DIAGNOSIS: "Who made your Coronavirus (COVID-19) diagnosis?" "Was it confirmed by a positive lab test?" If not diagnosed by a HCP, ask "Are there lots of cases (community spread) where you live?" (See public health department website, if unsure)     In the community 2. ONSET: "When did the COVID-19 symptoms start?"      Last Sunday 3. WORST SYMPTOM: "What is your worst symptom?" (e.g., cough, fever, shortness of breath, muscle aches)     Body aches  4. COUGH: "Do you have a cough?" If so, ask: "How bad is the cough?"       no 5. FEVER: "Do you have a fever?" If so, ask: "What is your temperature, how was it measured, and when did it start?"     Yes  99.3 T MAX yesterday 6. RESPIRATORY STATUS: "Describe your breathing?" (e.g., shortness of breath, wheezing, unable to speak)      normal 7. BETTER-SAME-WORSE: "Are you getting better, staying the same or getting worse compared to yesterday?"  If getting worse, ask, "In what way?"     Feeling better 8. HIGH RISK DISEASE: "Do you have any chronic medical problems?" (e.g., asthma, heart or lung disease, weak immune system,  etc.)     no 9. PREGNANCY: "Is there any chance you are pregnant?" "When was your last menstrual period?"     no 10. OTHER SYMPTOMS: "Do you have any other symptoms?"  (e.g., chills, fatigue, headache, loss of smell or taste, muscle pain, sore throat)       Chills, loss of taste and smell, sore throat last week  Protocols used: CORONAVIRUS (COVID-19) DIAGNOSED OR SUSPECTED-A-AH

## 2018-08-14 ENCOUNTER — Other Ambulatory Visit: Payer: 59

## 2018-08-14 DIAGNOSIS — Z20822 Contact with and (suspected) exposure to covid-19: Secondary | ICD-10-CM

## 2018-08-15 ENCOUNTER — Ambulatory Visit: Payer: 59 | Admitting: Family Medicine

## 2018-08-17 ENCOUNTER — Encounter: Payer: Self-pay | Admitting: Internal Medicine

## 2018-08-20 ENCOUNTER — Encounter: Payer: Self-pay | Admitting: Internal Medicine

## 2018-08-20 LAB — NOVEL CORONAVIRUS, NAA: SARS-CoV-2, NAA: DETECTED — AB

## 2018-08-21 ENCOUNTER — Telehealth: Payer: Self-pay | Admitting: Internal Medicine

## 2018-08-21 DIAGNOSIS — U071 COVID-19: Secondary | ICD-10-CM

## 2018-08-21 NOTE — Telephone Encounter (Signed)
Tested + COVID 19   Please call to inform the patient for further instructions  [place]

## 2018-08-22 NOTE — Telephone Encounter (Signed)
Pt aware of results. States she is feeling a little better.

## 2018-08-22 NOTE — Telephone Encounter (Signed)
Please call patient to let her know she does have coronavirus.    Stay quarantined.  See how she is feeling.   Symptomatic treatment.    Call with concerns.

## 2018-08-30 ENCOUNTER — Other Ambulatory Visit: Payer: Self-pay | Admitting: Internal Medicine

## 2018-09-12 NOTE — Progress Notes (Deleted)
Subjective:    Patient ID: Whitney Blake, female    DOB: 02/07/1961, 58 y.o.   MRN: [id number]  HPI The patient is here for follow up on her positive covid test.    Junes 21 st she started with symptoms and tested positive on July 1st.   Two days before her tests she lost her smell and taste.  She had a fever x 2/5 weeks.  Diarrhea x 4 weeks.  Still does not have smell and taste back.  Still mild ST - since last week in June.  Her eyes got very red.     Review of Systems  Constitutional: Positive for malaise/fatigue (at night). Negative for fever.  HENT: Positive for congestion (mild) and sore throat (ok during day, sore throat at night).        Loss of taste and smell  Respiratory: Negative for shortness of breath.   Gastrointestinal: Negative for diarrhea and nausea.  Neurological: Positive for headaches (in evening).     Medications and allergies reviewed with patient and updated if appropriate.  Patient Active Problem List   Diagnosis Date Noted  . COVID-19 virus detected 08/21/2018  . Suspected Covid-19 Virus Infection 08/13/2018  . Leg cramping 07/02/2018  . Discoloration of skin 07/02/2018  . Unspecified dyspareunia (CODE) 12/11/2017  . Lichen sclerosus of female genitalia 05/04/2017  . Vitamin D deficiency 05/03/2017  . Lumbar radiculopathy 05/15/2016  . Prediabetes 04/29/2016  . Breast pain, left 04/27/2016  . Leg pain, bilateral 04/23/2015  . Nephrolithiasis 09/06/2012  . Essential hypertension 12/07/2011  . Sleep disorder 05/25/2009  . Migraine 12/21/2008  . Postmenopausal 12/21/2008  . Anemia 09/03/2007  . NONSPECIFIC ABNORMAL ELECTROCARDIOGRAM 09/03/2007  . SPINAL STENOSIS 06/14/2006    Current Outpatient Medications on File Prior to Visit  Medication Sig Dispense Refill  . cholecalciferol (VITAMIN D) 1000 units tablet Take 2,000 Units by mouth daily.    [name] telmisartan-hydrochlorothiazide (MICARDIS HCT) 40-12.5 MG tablet Take 1 tablet by mouth daily.  90 tablet 2   No current facility-administered medications on file prior to visit.     Past Medical History:  Diagnosis Date  . Anemia    PMH of  . Cervical spinal stenosis    Dr Arnoldo Morale, [place]  . Hypertension   . Internal hemorrhoids   . Migraines    menstrual  . Nonspecific ST-T changes 2007  . Normal nuclear stress test 2007    Past Surgical History:  Procedure Laterality Date  . COLONOSCOPY  2012   neg; [facility]  . CONCUSSION     DUE TO FALL AT AGE 20  . FOOT SURGERY     Right   . LUMBAR LAMINECTOMY  2003   FOR RUPTURED DISC  . ORIF CLAVICULAR FRACTURE     bilaterally @ age 61  . TONSILLECTOMY AND ADENOIDECTOMY      Social History   Socioeconomic History  . Marital status: Married    Spouse name: Not on file  . Number of children: 2  . Years of education: Not on file  . Highest education level: Not on file  Occupational History  . Occupation: Chemical engineer: Editor, commissioning  Social Needs  . Financial resource strain: Not on file  . Food insecurity    Worry: Not on file    Inability: Not on file  . Transportation needs    Medical: Not on file    Non-medical: Not on file  Tobacco Use  .  Smoking status: Never Smoker  . Smokeless tobacco: Never Used  Substance and Sexual Activity  . Alcohol use: No  . Drug use: No  . Sexual activity: Not on file  Lifestyle  . Physical activity    Days per week: Not on file    Minutes per session: Not on file  . Stress: Not on file  Relationships  . Social Herbalist on phone: Not on file    Gets together: Not on file    Attends religious service: Not on file    Active member of club or organization: Not on file    Attends meetings of clubs or organizations: Not on file    Relationship status: Not on file  Other Topics Concern  . Not on file  Social History Narrative   Exercises 3 a week    Daily caffeine     Family History  Problem Relation Age of Onset  . Hypertension Mother         bilateral endarterectomies,pacer,mesenteric artery bypass, renal artery stenosis stented unilaterally, intestinal perforation, steroids for colitis induced osteoprosis, polyps   . Cancer Mother        uterine S/P Tamoxifen  . Transient ischemic attack Mother        in early 25's  . Colon polyps Mother   . Irritable bowel syndrome Mother   . Breast cancer Mother   . Heart disease Mother        pacer  . Asthma Father   . Hypertension Father   . Colon polyps Father   . Heart disease Father        S/P ablation for AF  . Stroke Father        cns bleed on coumadin  . Hypertension Sister        2 sisters  . Cancer Maternal Grandmother        uterine  . Diabetes Maternal Grandmother   . Cancer Sister        cervical  . Colon cancer Neg Hx     Review of Systems  Constitutional: Positive for malaise/fatigue (at night). Negative for fever.  HENT: Positive for congestion (mild) and sore throat (ok during day, sore throat at night).        Loss of taste and smell  Respiratory: Negative for shortness of breath.   Gastrointestinal: Negative for diarrhea and nausea.  Neurological: Positive for headaches (in evening).       Objective:  There were no vitals filed for this visit. BP Readings from Last 3 Encounters:  07/29/18 (!) 166/66  07/12/18 130/90  07/02/18 126/80   Wt Readings from Last 3 Encounters:  07/12/18 176 lb (79.8 kg)  07/02/18 176 lb 12.8 oz (80.2 kg)  05/04/17 181 lb (82.1 kg)   There is no height or weight on file to calculate BMI.   Physical Exam    Constitutional: Appears well-developed and well-nourished. No distress.  HENT:  Head: Normocephalic and atraumatic.  Neck: Neck supple. No tracheal deviation present. No thyromegaly present.  No cervical lymphadenopathy Cardiovascular: Normal rate, regular rhythm and normal heart sounds.   No murmur heard. No carotid bruit .  No edema Pulmonary/Chest: Effort normal and breath sounds normal. No respiratory  distress. No has no wheezes. No rales.  Skin: Skin is warm and dry. Not diaphoretic.  Psychiatric: Normal mood and affect. Behavior is normal.      Assessment & Plan:    See Problem List for Assessment and  Plan of chronic medical problems.

## 2018-09-13 ENCOUNTER — Ambulatory Visit (INDEPENDENT_AMBULATORY_CARE_PROVIDER_SITE_OTHER): Payer: 59 | Admitting: Internal Medicine

## 2018-09-13 DIAGNOSIS — U071 COVID-19: Secondary | ICD-10-CM

## 2018-09-13 MED ORDER — VITAMIN B-12 1000 MCG PO TABS: 1000.0000 ug | ORAL_TABLET | Freq: Every day | ORAL | Status: DC

## 2018-09-13 NOTE — Progress Notes (Signed)
Virtual Visit via Video Note  I connected with Ron Junco Mellin on 09/13/18 at  2:00 PM EDT by a video enabled telemedicine application and verified that I am speaking with the correct person using two identifiers.   I discussed the limitations of evaluation and management by telemedicine and the availability of in person appointments. The patient expressed understanding and agreed to proceed.  The patient is currently at home and I am in the office.    No referring provider.    History of Present Illness: This is an acute visit for follow up and questions regarding her COVID illness.    She started having cold symptoms June 21st and tested positive on July 1st.    Two days before her test she lost her smell and taste. She had a fever x 2.5 weeks. She had Diarrhea x 4 weeks. She had a sore throat and her eyes got very red. She never had SOB.    She is concerned because she still has a mild ST - since the last week in June. She still does not have her taste or smell back.  She feels ok during the day, but in the evening she starts to get fatigued and has a headache.  Every evening she feels like it is all coming back.      She was wondering if she needed blood work done to make sure something else was not going on. She wanted to know when these symptoms would go away.     Review of Systems  Constitutional: Positive for malaise/fatigue (at night). Negative for fever.  HENT: Positive for congestion (mild) and sore throat (ok during day, sore throat at night).  Loss of taste and smell  Respiratory: Negative for shortness of breath.  Gastrointestinal: Negative for diarrhea and nausea.  Neurological: Positive for headaches (in evening).     Social History   Socioeconomic History  . Marital status: Married    Spouse name: Not on file  . Number of children: 2  . Years of education: Not on file  . Highest education level: Not on file  Occupational History  . Occupation: Scientist, research (physical sciences): Editor, commissioning  Social Needs  . Financial resource strain: Not on file  . Food insecurity    Worry: Not on file    Inability: Not on file  . Transportation needs    Medical: Not on file    Non-medical: Not on file  Tobacco Use  . Smoking status: Never Smoker  . Smokeless tobacco: Never Used  Substance and Sexual Activity  . Alcohol use: No  . Drug use: No  . Sexual activity: Not on file  Lifestyle  . Physical activity    Days per week: Not on file    Minutes per session: Not on file  . Stress: Not on file  Relationships  . Social Herbalist on phone: Not on file    Gets together: Not on file    Attends religious service: Not on file    Active member of club or organization: Not on file    Attends meetings of clubs or organizations: Not on file    Relationship status: Not on file  Other Topics Concern  . Not on file  Social History Narrative   Exercises 3 a week    Daily caffeine      Observations/Objective: Appears well in NAD Breathing normally  Assessment and Plan:  See Problem List for  Assessment and Plan of chronic medical problems.   Follow Up Instructions:    I discussed the assessment and treatment plan with the patient. The patient was provided an opportunity to ask questions and all were answered. The patient agreed with the plan and demonstrated an understanding of the instructions.   The patient was advised to call back or seek an in-person evaluation if the symptoms worsen or if the condition fails to improve as anticipated.    Binnie Rail, MD

## 2018-09-14 ENCOUNTER — Encounter: Payer: Self-pay | Admitting: Internal Medicine

## 2018-09-14 DIAGNOSIS — U071 COVID-19: Secondary | ICD-10-CM | POA: Insufficient documentation

## 2018-09-14 NOTE — Assessment & Plan Note (Signed)
She was diagnosed 7/1 and still has some residual symptoms - fatigue, headaches, mild sore throat and loss of taste and smell.  No blood work is needed - discussed these symptoms are all related to [place] and have gotten better.  They can last for a while and there is no way to tell when they will go away completely.  Her taste and smell may never return back to normal    Symptomatic treatment of symptoms Call with questions or concerns

## 2018-11-28 ENCOUNTER — Other Ambulatory Visit: Payer: Self-pay

## 2018-11-28 ENCOUNTER — Ambulatory Visit (INDEPENDENT_AMBULATORY_CARE_PROVIDER_SITE_OTHER)
Admission: RE | Admit: 2018-11-28 | Discharge: 2018-11-28 | Disposition: A | Discharge status: Home / Self Care | Payer: 59 | Source: Ambulatory Visit

## 2018-11-28 DIAGNOSIS — Z20822 Contact with and (suspected) exposure to covid-19: Secondary | ICD-10-CM

## 2018-11-28 DIAGNOSIS — R6883 Chills (without fever): Secondary | ICD-10-CM | POA: Diagnosis not present

## 2018-11-28 DIAGNOSIS — H00025 Hordeolum internum left lower eyelid: Secondary | ICD-10-CM | POA: Diagnosis not present

## 2018-11-28 DIAGNOSIS — R0981 Nasal congestion: Secondary | ICD-10-CM | POA: Diagnosis not present

## 2018-11-28 MED ORDER — ERYTHROMYCIN 5 MG/GM OP OINT: TOPICAL_OINTMENT | OPHTHALMIC | 0 refills | Status: DC

## 2018-11-28 MED ORDER — FLUTICASONE PROPIONATE 50 MCG/ACT NA SUSP: 2.0000 | Freq: Every day | NASAL | 0 refills | Status: DC

## 2018-11-28 NOTE — Discharge Instructions (Addendum)
Start erythromycin ointment as directed. Warm compresses as directed. Flonase for nasal congestion/drainage. Monitor for any worsening of symptoms, changes in vision, sensitivity to light, eye swelling, painful eye movement, follow up with ophthalmology for further evaluation.   As discussed, we are retesting for Covid given new onset of symptoms.  Quarantine until testing results return.  If developing other symptoms, fever, shortness of breath, cough, may need in person evaluation.  If you become so short of breath he cannot speak in full sentences, or walking is difficult, go to the emergency department for further evaluation.

## 2018-11-28 NOTE — ED Provider Notes (Signed)
Virtual Visit via Video Note:  Whitney Blake  initiated request for Telemedicine visit with [facility] team. I connected with Dystiny Sehorn Fisk  on 11/28/2018 at 9:09 AM  for a synchronized telemedicine visit using a video enabled HIPPA compliant telemedicine application. I verified that I am speaking with Cristina Gong [place]  using two identifiers. Ok Edwards, PA-C  was physically located in a [facility]gent care site and ANGELIYAH DALUZ was located at a different location.   The limitations of evaluation and management by telemedicine as well as the availability of in-person appointments were discussed. Patient was informed that she  may incur a bill ( including co-pay) for this virtual visit encounter. Kinisha G Neuharth  expressed understanding and gave verbal consent to proceed with virtual visit.   History of Present Illness:Whitney Blake  is a 58 y.o. female presents with 5 day history of left eyelid swelling, soreness, pain.  Has also noticed some nasal congestion, throat irritation.  She denies any eye discharge/crusting.  She notices conjunctive a the left lower eyelid is more red, but no injection to the sclera.  Denies vision changes, photophobia.  Denies contact lens use, does wear reading glasses.  She has been taking allergy medicines for her symptoms.  Patient with positive Covid test end of June.  States most symptoms had resolved, but still had loss of taste/smell.  Since current symptom onset, she denies any worsening of loss of taste/smell.  She denies cough, fever, body aches.  Does admit to occasional chills.  Denies shortness of breath.  Denies abdominal pain, nausea, vomiting, diarrhea.  Past Medical History:  Diagnosis Date  . Anemia    PMH of  . Cervical spinal stenosis    Dr Arnoldo Morale, [place]  . Hypertension   . Internal hemorrhoids   . Migraines    menstrual  . Nonspecific ST-T changes 2007  . Normal nuclear stress test 2007    Allergies  Allergen Reactions  . Rofecoxib  Swelling    Vioxx caused  swelling of hands& feet  . Coreg [Carvedilol] Other (See Comments)    Caused hot flashes        Observations/Objective: General: Well appearing, nontoxic, no acute distress. Sitting comfortably. Head: Normocephalic, atraumatic Eye: left lower eyelid with swelling, no erythema/warmth. Per patient, slight tenderness to palpation. Small wound/swelling to the left lower mid eyelid, slightly interior, though unable to see clearly on video. Area is more tender per patient. No obvious conjunctival/sclera injection noticed on video. EOMI. Unable to visual pupils on video.  ENT: Mucus membranes moist, no lip cracking. No obvious nasal drainage. Pulm: Speaking in full sentences without difficulty. Normal effort. No respiratory distress, accessory muscle use. Neuro: Normal mental status. Alert and oriented x 3.   Assessment and Plan: Will treat for hordeolum with erythromycin and warm compress. Discussed with patient, it is unclear whether retesting within 3 months of COVID is necessary. Patient would like retesting given new onset of symptoms. Discussed will need to remain in quarantine until testing results return. Symptomatic treatment discussed. Return precautions given. Patient expresses understanding and agrees to plan.   Follow Up Instructions:    I discussed the assessment and treatment plan with the patient. The patient was provided an opportunity to ask questions and all were answered. The patient agreed with the plan and demonstrated an understanding of the instructions.   The patient was advised to call back or seek an in-person evaluation if the symptoms  worsen or if the condition fails to improve as anticipated.  I provided 25 minutes of non-face-to-face time during this encounter.    Ok Edwards, PA-C  11/28/2018 9:09 AM        Ok Edwards, PA-C 11/28/18 [id number]

## 2018-11-30 LAB — NOVEL CORONAVIRUS, NAA: SARS-CoV-2, NAA: NOT DETECTED

## 2018-12-26 ENCOUNTER — Encounter: Payer: Self-pay | Admitting: Internal Medicine

## 2018-12-30 ENCOUNTER — Other Ambulatory Visit: Payer: Self-pay | Admitting: Obstetrics and Gynecology

## 2018-12-30 DIAGNOSIS — Z1231 Encounter for screening mammogram for malignant neoplasm of breast: Secondary | ICD-10-CM

## 2019-01-23 ENCOUNTER — Other Ambulatory Visit: Payer: Self-pay

## 2019-01-23 ENCOUNTER — Ambulatory Visit
Admission: RE | Admit: 2019-01-23 | Discharge: 2019-01-23 | Disposition: A | Discharge status: Home / Self Care | Payer: 59 | Source: Ambulatory Visit | Attending: Obstetrics and Gynecology | Admitting: Obstetrics and Gynecology

## 2019-01-23 DIAGNOSIS — Z1231 Encounter for screening mammogram for malignant neoplasm of breast: Secondary | ICD-10-CM

## 2019-01-27 ENCOUNTER — Encounter: Payer: Self-pay | Admitting: Family Medicine

## 2019-01-27 ENCOUNTER — Other Ambulatory Visit: Payer: Self-pay

## 2019-01-27 DIAGNOSIS — M5416 Radiculopathy, lumbar region: Secondary | ICD-10-CM

## 2019-01-30 ENCOUNTER — Ambulatory Visit
Admission: RE | Admit: 2019-01-30 | Discharge: 2019-01-30 | Disposition: A | Discharge status: Home / Self Care | Payer: 59 | Source: Ambulatory Visit | Attending: Family Medicine | Admitting: Family Medicine

## 2019-01-30 ENCOUNTER — Other Ambulatory Visit: Payer: Self-pay

## 2019-01-30 DIAGNOSIS — M5416 Radiculopathy, lumbar region: Secondary | ICD-10-CM

## 2019-01-30 MED ORDER — IOPAMIDOL (ISOVUE-M 200) INJECTION 41%
1.0000 mL | Freq: Once | INTRAMUSCULAR | Status: AC
  Administered 2019-01-30: 13:00:00 1 mL via EPIDURAL

## 2019-01-30 MED ORDER — METHYLPREDNISOLONE ACETATE 40 MG/ML INJ SUSP (RADIOLOG
120.0000 mg | Freq: Once | INTRAMUSCULAR | Status: AC
  Administered 2019-01-30: 13:00:00 120 mg via EPIDURAL

## 2019-01-30 NOTE — Discharge Instructions (Signed)

## 2019-02-24 ENCOUNTER — Encounter: Payer: Self-pay | Admitting: Family Medicine

## 2019-02-25 ENCOUNTER — Encounter: Payer: Self-pay | Admitting: Family Medicine

## 2019-02-25 ENCOUNTER — Ambulatory Visit: Payer: 59 | Admitting: Family Medicine

## 2019-02-25 ENCOUNTER — Other Ambulatory Visit: Payer: Self-pay

## 2019-02-25 ENCOUNTER — Ambulatory Visit (INDEPENDENT_AMBULATORY_CARE_PROVIDER_SITE_OTHER): Payer: 59

## 2019-02-25 VITALS — BP 130/78 | HR 57 | Ht 66.0 in | Wt 182.0 lb

## 2019-02-25 DIAGNOSIS — G5781 Other specified mononeuropathies of right lower limb: Secondary | ICD-10-CM | POA: Diagnosis not present

## 2019-02-25 DIAGNOSIS — M5416 Radiculopathy, lumbar region: Secondary | ICD-10-CM | POA: Diagnosis not present

## 2019-02-25 DIAGNOSIS — M79671 Pain in right foot: Secondary | ICD-10-CM

## 2019-02-25 MED ORDER — GABAPENTIN 100 MG PO CAPS: 200.0000 mg | ORAL_CAPSULE | Freq: Every day | ORAL | 3 refills | Status: DC

## 2019-02-25 NOTE — Progress Notes (Signed)
[place] [address] [place] Phone: [phone] Subjective:   I Whitney Blake am serving as a Education administrator for Dr. Hulan Saas.  This visit occurred during the SARS-CoV-2 public health emergency.  Safety protocols were in place, including screening questions prior to the visit, additional usage of staff PPE, and extensive cleaning of exam room while observing appropriate contact time as indicated for disinfecting solutions.    CC: Low back pain, foot pain  [id number]  Whitney Blake is a 59 y.o. female coming in with complaint of back/foot pain. Foot injection. Follow up of epidural. States her first epidural worked the best. Epidural was 3 weeks ago and she believes this one didn't work as good as the last one. Right foot. History of foot surgery (neuroma). Toes are numb and she states that it feels like there is a ball under her toes. History of injections in the back..  Patient did have a nerve root block of the L5 area.  Feels like it just did not get quite the positioning as it did last time.  Patient has had a removal of the neuroma bradycardia previously in 2013.  Increasing discomfort but not as much pain.  Has more numbness in that foot.      Past Medical History:  Diagnosis Date  . Anemia    PMH of  . Cervical spinal stenosis    Dr Arnoldo Morale, [place]  . Hypertension   . Internal hemorrhoids   . Migraines    menstrual  . Nonspecific ST-T changes 2007  . Normal nuclear stress test 2007   Past Surgical History:  Procedure Laterality Date  . COLONOSCOPY  2012   neg; [facility]  . CONCUSSION     DUE TO FALL AT AGE 37  . FOOT SURGERY     Right   . LUMBAR LAMINECTOMY  2003   FOR RUPTURED DISC  . ORIF CLAVICULAR FRACTURE     bilaterally @ age 74  . TONSILLECTOMY AND ADENOIDECTOMY     Social History   Socioeconomic History  . Marital status: Married    Spouse name: Not on file  . Number of children: 2  . Years of  education: Not on file  . Highest education level: Not on file  Occupational History  . Occupation: Chemical engineer: [employer]  Tobacco Use  . Smoking status: Never Smoker  . Smokeless tobacco: Never Used  Substance and Sexual Activity  . Alcohol use: No  . Drug use: No  . Sexual activity: Not on file  Other Topics Concern  . Not on file  Social History Narrative   Exercises 3 a week    Daily caffeine    Social Determinants of Health   Financial Resource Strain:   . Difficulty of Paying Living Expenses: Not on file  Food Insecurity:   . Worried About Charity fundraiser in the Last Year: Not on file  . Ran Out of Food in the Last Year: Not on file  Transportation Needs:   . Lack of Transportation (Medical): Not on file  . Lack of Transportation (Non-Medical): Not on file  Physical Activity:   . Days of Exercise per Week: Not on file  . Minutes of Exercise per Session: Not on file  Stress:   . Feeling of Stress : Not on file  Social Connections:   . Frequency of Communication with Friends and Family: Not on file  .  Frequency of Social Gatherings with Friends and Family: Not on file  . Attends Religious Services: Not on file  . Active Member of Clubs or Organizations: Not on file  . Attends Archivist Meetings: Not on file  . Marital Status: Not on file   Allergies  Allergen Reactions  . Rofecoxib Swelling    Vioxx caused  swelling of hands& feet  . Coreg [Carvedilol] Other (See Comments)    Caused hot flashes   Family History  Problem Relation Age of Onset  . Hypertension Mother        bilateral endarterectomies,pacer,mesenteric artery bypass, renal artery stenosis stented unilaterally, intestinal perforation, steroids for colitis induced osteoprosis, polyps   . Cancer Mother        uterine S/P Tamoxifen  . Transient ischemic attack Mother        in early 57's  . Colon polyps Mother   . Irritable bowel syndrome Mother   . Breast  cancer Mother   . Heart disease Mother        pacer  . Asthma Father   . Hypertension Father   . Colon polyps Father   . Heart disease Father        S/P ablation for AF  . Stroke Father        cns bleed on coumadin  . Hypertension Sister        2 sisters  . Cancer Maternal Grandmother        uterine  . Diabetes Maternal Grandmother   . Cancer Sister        cervical  . Colon cancer Neg Hx      Current Outpatient Medications (Cardiovascular):  .  telmisartan-hydrochlorothiazide (MICARDIS HCT) 40-12.5 MG tablet, Take 1 tablet by mouth daily.  Current Outpatient Medications (Respiratory):  .  fluticasone (FLONASE) 50 MCG/ACT nasal spray, Place 2 sprays into both nostrils daily.   Current Outpatient Medications (Hematological):  .  vitamin B-12 (CYANOCOBALAMIN) 1000 MCG tablet, Take 1 tablet (1,000 mcg total) by mouth daily.  Current Outpatient Medications (Other):  .  cholecalciferol (VITAMIN D) 1000 units tablet, Take 2,000 Units by mouth daily. [name]  erythromycin ophthalmic ointment, Place a 1/2 inch ribbon of ointment into the lower eyelid 4 times a day for 7 days .  gabapentin (NEURONTIN) 100 MG capsule, Take 2 capsules (200 mg total) by mouth at bedtime.    Past medical history, social, surgical and family history all reviewed in electronic medical record.  No pertanent information unless stated regarding to the chief complaint.   Review of Systems:  No headache, visual changes, nausea, vomiting, diarrhea, constipation, dizziness, abdominal pain, skin rash, fevers, chills, night sweats, weight loss, swollen lymph nodes, body aches, joint swelling, muscle aches, chest pain, shortness of breath, mood changes.   Objective  Blood pressure 130/78, pulse (!) 57, height 5\' 6"  (1.676 m), weight 182 lb (82.6 kg), last menstrual period 02/14/2012, SpO2 94 %. Systems examined below as of    General: No apparent distress alert and oriented x3 mood and affect normal, dressed  appropriately.  HEENT: Pupils equal, extraocular movements intact  Respiratory: Patient's speak in full sentences and does not appear short of breath  Cardiovascular: No lower extremity edema, non tender, no erythema  Skin: Warm dry intact with no signs of infection or rash on extremities or on axial skeleton.  Abdomen: Soft nontender  Neuro: Cranial nerves II through XII are intact, neurovascularly intact in all extremities with 2+ DTRs and 2+ pulses.  Lymph: No lymphadenopathy of posterior or anterior cervical chain or axillae bilaterally.  Gait normal with good balance and coordination.  MSK:    Right foot exam shows severe breakdown the transverse arch.  Mild positive squeeze test.  Tenderness noted in the second and third intermetatarsal space.  No masses appreciated.  Patient has had surgical intervention of a bunion and bunionette of the first and fifth toes.  Previous surgery for neuroma seems to be between the third and fourth metatarsal heads.  Back exam does have some mild loss of lordosis.  Negative straight leg test today which is an improvement though.  4+ out of 5 strength that is nearly symmetric to the contralateral side.  Limited musculoskeletal ultrasound was performed and interpreted by Lyndal Pulley  Limited ultrasound of patient's right foot shows the patient does have a neuroma between the second and third toes noted.  Mild hypoechoic changes surrounding the nerve sheath but no increase in Doppler flow.  No cortical irregularity of the foot.  Procedure: Real-time Ultrasound Guided Injection of right foot neuroma Device: GE Logiq Q7 Ultrasound guided injection is preferred based studies that show increased duration, increased effect, greater accuracy, decreased procedural pain, increased response rate, and decreased cost with ultrasound guided versus blind injection.  Verbal informed consent obtained.  Time-out conducted.  Noted no overlying erythema, induration, or  other signs of local infection.  Skin prepped in a sterile fashion.  Local anesthesia: Topical Ethyl chloride.  With sterile technique and under real time ultrasound guidance: With a 25-gauge half inch needle injected with 0.5 cc of 0.5% Marcaine and 0.5 cc of Kenalog 40 mg/mL into the nerve sheath Completed without difficulty  Pain immediately resolved suggesting accurate placement of the medication.  Advised to call if fevers/chills, erythema, induration, drainage, or persistent bleeding.  Images permanently stored and available for review in the ultrasound unit.  Impression: Technically successful ultrasound guided injection.    Impression and Recommendations:     This case required medical decision making of moderate complexity. The above documentation has been reviewed and is accurate and complete Lyndal Pulley, DO       Note: This dictation was prepared with Dragon dictation along with smaller phrase technology. Any transcriptional errors that result from this process are unintentional.

## 2019-02-25 NOTE — Patient Instructions (Signed)
Good to see you Exercise 3 times a week Avoid being barefoot recovery sandals in house Spenco orthotics "total support"  See me again in 4 weeks write me in 3 weeks about your back

## 2019-02-25 NOTE — Assessment & Plan Note (Signed)
Injection today, lumbar tenderness exercise, discussed metatarsal pad, topical anti-inflammatories, icing regimen.  Discussed avoiding being barefoot.  Increase activity as tolerated.  Follow-up again in 4 to 6 weeks.

## 2019-02-25 NOTE — Assessment & Plan Note (Signed)
Patient has had difficulty previously.  Has had a laminotomy at L5-S1 but continues to have a nerve root impingement.  Patient continues to have pain and has not responded as well to the injections.  Discussed the possibility of radiofrequency ablation.  Patient wants to avoid that if possible.  We will give her another 3 weeks and then refer accordingly if necessary.

## 2019-03-17 ENCOUNTER — Encounter: Payer: Self-pay | Admitting: Internal Medicine

## 2019-03-17 DIAGNOSIS — Z0184 Encounter for antibody response examination: Secondary | ICD-10-CM

## 2019-03-19 ENCOUNTER — Other Ambulatory Visit: Payer: Self-pay

## 2019-03-19 ENCOUNTER — Encounter: Payer: Self-pay | Admitting: Family Medicine

## 2019-03-19 ENCOUNTER — Other Ambulatory Visit: Payer: 59

## 2019-03-19 DIAGNOSIS — Z0184 Encounter for antibody response examination: Secondary | ICD-10-CM

## 2019-03-20 ENCOUNTER — Encounter: Payer: Self-pay | Admitting: Internal Medicine

## 2019-03-20 LAB — SAR COV2 SEROLOGY (COVID19)AB(IGG),IA: SARS CoV2 AB IGG: POSITIVE — AB

## 2019-03-25 ENCOUNTER — Ambulatory Visit: Payer: 59 | Admitting: Family Medicine

## 2019-05-01 ENCOUNTER — Ambulatory Visit: Payer: 59 | Admitting: Family Medicine

## 2019-05-01 ENCOUNTER — Ambulatory Visit (INDEPENDENT_AMBULATORY_CARE_PROVIDER_SITE_OTHER): Payer: 59

## 2019-05-01 ENCOUNTER — Other Ambulatory Visit: Payer: Self-pay

## 2019-05-01 ENCOUNTER — Encounter: Payer: Self-pay | Admitting: Family Medicine

## 2019-05-01 VITALS — BP 124/94 | HR 72 | Ht 66.0 in | Wt 185.0 lb

## 2019-05-01 DIAGNOSIS — M5416 Radiculopathy, lumbar region: Secondary | ICD-10-CM

## 2019-05-01 DIAGNOSIS — M7751 Other enthesopathy of right foot: Secondary | ICD-10-CM

## 2019-05-01 DIAGNOSIS — M79671 Pain in right foot: Secondary | ICD-10-CM

## 2019-05-01 MED ORDER — DOXYCYCLINE HYCLATE 100 MG PO TABS: 100.0000 mg | ORAL_TABLET | Freq: Two times a day (BID) | ORAL | 0 refills | Status: AC

## 2019-05-01 NOTE — Assessment & Plan Note (Signed)
Doing significantly better with the gabapentin no changes.

## 2019-05-01 NOTE — Assessment & Plan Note (Signed)
Injected today.  Tolerated procedure well.  Did not on the plantar aspect of the foot and given doxycycline secondary to unable to see if there is any change in for any type of infectious.  Hopefully this will help patient.  Discussed with patient about the potential for injecting PRP into the scar tissue from the neuroma removal.  Patient will consider it and follow-up again in 6 to 8 weeks

## 2019-05-01 NOTE — Progress Notes (Signed)
[place] Phone: [phone] Subjective:   Fontaine No, am serving as a scribe for Dr. Hulan Saas. This visit occurred during the SARS-CoV-2 public health emergency.  Safety protocols were in place, including screening questions prior to the visit, additional usage of staff PPE, and extensive cleaning of exam room while observing appropriate contact time as indicated for disinfecting solutions.   I'm seeing this patient by the request  of:  Binnie Rail, MD  CC: Back pain follow-up, foot pain  [id number]   02/25/2019 Patient has had difficulty previously.  Has had a laminotomy at L5-S1 but continues to have a nerve root impingement.  Patient continues to have pain and has not responded as well to the injections.  Discussed the possibility of radiofrequency ablation.  Patient wants to avoid that if possible.  We will give her another 3 weeks and then refer accordingly if necessary.  Injection today, lumbar tenderness exercise, discussed metatarsal pad, topical anti-inflammatories, icing regimen.  Discussed avoiding being barefoot.  Increase activity as tolerated.  Follow-up again in 4 to 6 weeks.  Update 05/01/2019 RHAVEN HEITZ is a 59 y.o. female coming in with complaint of back pain and neuroma of right foot. Patient states that her back is feeling good. No issues since last visit.   Patient states that it all seems to be more of the foot.  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  Attempted injection and patient states that some of the aching pain is improved especially with the gabapentin the pain is mostly over the plantar aspect of the foot.  Patient would like this evaluated as well.    Past Medical History:  Diagnosis Date  . Anemia    PMH of  . Cervical spinal stenosis    Dr Arnoldo Morale, [place]  . Hypertension   . Internal hemorrhoids   .  Migraines    menstrual  . Nonspecific ST-T changes 2007  . Normal nuclear stress test 2007   Past Surgical History:  Procedure Laterality Date  . COLONOSCOPY  2012   neg; [facility]  . CONCUSSION     DUE TO FALL AT AGE 37  . FOOT SURGERY     Right   . LUMBAR LAMINECTOMY  2003   FOR RUPTURED DISC  . ORIF CLAVICULAR FRACTURE     bilaterally @ age 79  . TONSILLECTOMY AND ADENOIDECTOMY     Social History   Socioeconomic History  . Marital status: Married    Spouse name: Not on file  . Number of children: 2  . Years of education: Not on file  . Highest education level: Not on file  Occupational History  . Occupation: Chemical engineer: [employer]  Tobacco Use  . Smoking status: Never Smoker  . Smokeless tobacco: Never Used  Substance and Sexual Activity  . Alcohol use: No  . Drug use: No  . Sexual activity: Not on file  Other Topics Concern  . Not on file  Social History Narrative   Exercises 3 a week    Daily caffeine    Social Determinants of Health   Financial Resource Strain:   . Difficulty of Paying Living Expenses:   Food Insecurity:   . Worried About Charity fundraiser in the Last Year:   . [place] in the Last Year:  Transportation Needs:   . Film/video editor (Medical):   [name] Lack of Transportation (Non-Medical):   Physical Activity:   . Days of Exercise per Week:   . Minutes of Exercise per Session:   Stress:   . Feeling of Stress :   Social Connections:   . Frequency of Communication with Friends and Family:   . Frequency of Social Gatherings with Friends and Family:   . Attends Religious Services:   . Active Member of Clubs or Organizations:   . Attends Archivist Meetings:   [name] Marital Status:    Allergies  Allergen Reactions  . Rofecoxib Swelling    Vioxx caused  swelling of hands& feet  . Coreg [Carvedilol] Other (See Comments)    Caused hot flashes   Family History  Problem Relation Age of Onset  .  Hypertension Mother        bilateral endarterectomies,pacer,mesenteric artery bypass, renal artery stenosis stented unilaterally, intestinal perforation, steroids for colitis induced osteoprosis, polyps   . Cancer Mother        uterine S/P Tamoxifen  . Transient ischemic attack Mother        in early 73's  . Colon polyps Mother   . Irritable bowel syndrome Mother   . Breast cancer Mother   . Heart disease Mother        pacer  . Asthma Father   . Hypertension Father   . Colon polyps Father   . Heart disease Father        S/P ablation for AF  . Stroke Father        cns bleed on coumadin  . Hypertension Sister        2 sisters  . Cancer Maternal Grandmother        uterine  . Diabetes Maternal Grandmother   . Cancer Sister        cervical  . Colon cancer Neg Hx      Current Outpatient Medications (Cardiovascular):  .  telmisartan-hydrochlorothiazide (MICARDIS HCT) 40-12.5 MG tablet, Take 1 tablet by mouth daily.  Current Outpatient Medications (Respiratory):  .  fluticasone (FLONASE) 50 MCG/ACT nasal spray, Place 2 sprays into both nostrils daily.   Current Outpatient Medications (Hematological):  .  vitamin B-12 (CYANOCOBALAMIN) 1000 MCG tablet, Take 1 tablet (1,000 mcg total) by mouth daily.  Current Outpatient Medications (Other):  .  cholecalciferol (VITAMIN D) 1000 units tablet, Take 2,000 Units by mouth daily. [name]  erythromycin ophthalmic ointment, Place a 1/2 inch ribbon of ointment into the lower eyelid 4 times a day for 7 days .  gabapentin (NEURONTIN) 100 MG capsule, Take 2 capsules (200 mg total) by mouth at bedtime. [name]  doxycycline (VIBRA-TABS) 100 MG tablet, Take 1 tablet (100 mg total) by mouth 2 (two) times daily for 7 days.   Reviewed prior external information including notes and imaging from  primary care provider As well as notes that were available from care everywhere and other healthcare systems.  Past medical history, social, surgical and family  history all reviewed in electronic medical record.  No pertanent information unless stated regarding to the chief complaint.   Review of Systems:  No headache, visual changes, nausea, vomiting, diarrhea, constipation, dizziness, abdominal pain, skin rash, fevers, chills, night sweats, weight loss, swollen lymph nodes, body aches, joint swelling, chest pain, shortness of breath, mood changes. POSITIVE muscle aches  Objective  Blood pressure (!) 124/94, pulse 72, height 5\' 6"  (1.676 m), weight 185 lb (83.9 kg),  last menstrual period 02/14/2012, SpO2 98 %.   General: No apparent distress alert and oriented x3 mood and affect normal, dressed appropriately.  HEENT: Pupils equal, extraocular movements intact  Respiratory: Patient's speak in full sentences and does not appear short of breath  Cardiovascular: No lower extremity edema, non tender, no erythema  Skin: Warm dry intact with no signs of infection or rash on extremities or on axial skeleton.  Abdomen: Soft nontender  Neuro: Cranial nerves II through XII are intact, neurovascularly intact in all extremities with 2+ DTRs and 2+ pulses.  Lymph: No lymphadenopathy of posterior or anterior cervical chain or axillae bilaterally.  Gait normal with good balance and coordination.  MSK:  Non tender with full range of motion and good stability and symmetric strength and tone of shoulders, elbows, wrist, hip, knee and ankles bilaterally.  Right foot exam shows the patient does have a scar on the dorsal aspect of the foot from a neuroma removal.  Scar tissue formation noted in this area with some mild atrophy of some of the overlying musculature.  Patient does have mild positive squeeze test.  Patient was severely tender to palpation more over the plantar aspect of the foot underneath the fifth toe.  Procedure: Real-time Ultrasound Guided Injection of right foot bursitis Device: GE Logiq Q7 Ultrasound guided injection is preferred based studies that show  increased duration, increased effect, greater accuracy, decreased procedural pain, increased response rate, and decreased cost with ultrasound guided versus blind injection.  Verbal informed consent obtained.  Time-out conducted.  Noted no overlying erythema, induration, or other signs of local infection.  Skin prepped in a sterile fashion.  Local anesthesia: Topical Ethyl chloride.  With sterile technique and under real time ultrasound guidance: With a 25-gauge half inch needle injected in the plantar aspect of the third toe with ultrasound guidance into the bursa.  A total of 0.25 cc of 0.5% Marcaine and 0.5 cc of Kenalog 40 mg/mL used Completed without difficulty  Pain immediately resolved suggesting accurate placement of the medication.  Advised to call if fevers/chills, erythema, induration, drainage, or persistent bleeding.  Images permanently stored and available for review in the ultrasound unit.  Impression: Technically successful ultrasound guided injection.   Impression and Recommendations:     This case required medical decision making of moderate complexity. The above documentation has been reviewed and is accurate and complete Lyndal Pulley, DO       Note: This dictation was prepared with Dragon dictation along with smaller phrase technology. Any transcriptional errors that result from this process are unintentional.

## 2019-05-01 NOTE — Patient Instructions (Signed)
Good to see you  Doxy 2 times a day for 3 days to be safe Continue the gabapentin  Lets check in 1 more time in 6-8 weeks! Wide toe box shoes

## 2019-05-20 ENCOUNTER — Encounter: Payer: Self-pay | Admitting: Internal Medicine

## 2019-06-11 ENCOUNTER — Other Ambulatory Visit: Payer: Self-pay | Admitting: Internal Medicine

## 2019-06-24 ENCOUNTER — Ambulatory Visit: Payer: 59 | Admitting: Family Medicine

## 2019-06-24 ENCOUNTER — Other Ambulatory Visit: Payer: Self-pay

## 2019-06-24 ENCOUNTER — Encounter: Payer: Self-pay | Admitting: Family Medicine

## 2019-06-24 DIAGNOSIS — G5781 Other specified mononeuropathies of right lower limb: Secondary | ICD-10-CM | POA: Diagnosis not present

## 2019-06-24 DIAGNOSIS — M5416 Radiculopathy, lumbar region: Secondary | ICD-10-CM

## 2019-06-24 NOTE — Patient Instructions (Signed)
PRP on foot this week or next week

## 2019-06-24 NOTE — Assessment & Plan Note (Signed)
Patient is doing much better with the gabapentin at this moment. Continue the same dosing. We'll can play from 1-3 of these pills at night. Continue all other medications, home exercises and core strengthening. Follow-up again in 4 to 8 weeks

## 2019-06-24 NOTE — Progress Notes (Signed)
[name] [address] [place] Phone: [phone] Subjective:   I Whitney Blake am serving as a Education administrator for Dr. Hulan Saas.  This visit occurred during the SARS-CoV-2 public health emergency.  Safety protocols were in place, including screening questions prior to the visit, additional usage of staff PPE, and extensive cleaning of exam room while observing appropriate contact time as indicated for disinfecting solutions.   I'm seeing this patient by the request  of:  Binnie Rail, MD  CC: Foot pain follow-up  [id number]   05/01/2019 Injected today.  Tolerated procedure well.  Did not on the plantar aspect of the foot and given doxycycline secondary to unable to see if there is any change in for any type of infectious.  Hopefully this will help patient.  Discussed with patient about the potential for injecting PRP into the scar tissue from the neuroma removal.  Patient will consider it and follow-up again in 6 to 8 weeks  Update 06/24/2019 Whitney Blake is a 59 y.o. female coming in with complaint of right foot pain. Patient states the bottom of her foot is still numb. Leg is doing a lot better.  Patient states that the gabapentin has been very helpful and no side effects at the moment.     Past Medical History:  Diagnosis Date  . Anemia    PMH of  . Cervical spinal stenosis    Dr Arnoldo Morale, [place]  . Hypertension   . Internal hemorrhoids   . Migraines    menstrual  . Nonspecific ST-T changes 2007  . Normal nuclear stress test 2007   Past Surgical History:  Procedure Laterality Date  . COLONOSCOPY  2012   neg; [facility]  . CONCUSSION     DUE TO FALL AT AGE 41  . FOOT SURGERY     Right   . LUMBAR LAMINECTOMY  2003   FOR RUPTURED DISC  . ORIF CLAVICULAR FRACTURE     bilaterally @ age 89  . TONSILLECTOMY AND ADENOIDECTOMY     Social History   Socioeconomic History  . Marital status: Married    Spouse name: Not on file    . Number of children: 2  . Years of education: Not on file  . Highest education level: Not on file  Occupational History  . Occupation: Chemical engineer: [employer]  Tobacco Use  . Smoking status: Never Smoker  . Smokeless tobacco: Never Used  Substance and Sexual Activity  . Alcohol use: No  . Drug use: No  . Sexual activity: Not on file  Other Topics Concern  . Not on file  Social History Narrative   Exercises 3 a week    Daily caffeine    Social Determinants of Health   Financial Resource Strain:   . Difficulty of Paying Living Expenses:   Food Insecurity:   . Worried About Charity fundraiser in the Last Year:   . Arboriculturist in the Last Year:   Transportation Needs:   . Film/video editor (Medical):   [name] Lack of Transportation (Non-Medical):   Physical Activity:   . Days of Exercise per Week:   . Minutes of Exercise per Session:   Stress:   . Feeling of Stress :   Social Connections:   . Frequency of Communication with Friends and Family:   . Frequency of Social Gatherings with Friends and Family:   . Attends  Religious Services:   . Active Member of Clubs or Organizations:   . Attends Archivist Meetings:   [name] Marital Status:    Allergies  Allergen Reactions  . Rofecoxib Swelling    Vioxx caused  swelling of hands& feet  . Coreg [Carvedilol] Other (See Comments)    Caused hot flashes   Family History  Problem Relation Age of Onset  . Hypertension Mother        bilateral endarterectomies,pacer,mesenteric artery bypass, renal artery stenosis stented unilaterally, intestinal perforation, steroids for colitis induced osteoprosis, polyps   . Cancer Mother        uterine S/P Tamoxifen  . Transient ischemic attack Mother        in early 64's  . Colon polyps Mother   . Irritable bowel syndrome Mother   . Breast cancer Mother   . Heart disease Mother        pacer  . Asthma Father   . Hypertension Father   . Colon polyps  Father   . Heart disease Father        S/P ablation for AF  . Stroke Father        cns bleed on coumadin  . Hypertension Sister        2 sisters  . Cancer Maternal Grandmother        uterine  . Diabetes Maternal Grandmother   . Cancer Sister        cervical  . Colon cancer Neg Hx      Current Outpatient Medications (Cardiovascular):  .  telmisartan-hydrochlorothiazide (MICARDIS HCT) 40-12.5 MG tablet, TAKE 1 TABLET BY MOUTH EVERY DAY  Current Outpatient Medications (Respiratory):  .  fluticasone (FLONASE) 50 MCG/ACT nasal spray, Place 2 sprays into both nostrils daily.   Current Outpatient Medications (Hematological):  .  vitamin B-12 (CYANOCOBALAMIN) 1000 MCG tablet, Take 1 tablet (1,000 mcg total) by mouth daily.  Current Outpatient Medications (Other):  .  cholecalciferol (VITAMIN D) 1000 units tablet, Take 2,000 Units by mouth daily. [name]  erythromycin ophthalmic ointment, Place a 1/2 inch ribbon of ointment into the lower eyelid 4 times a day for 7 days .  gabapentin (NEURONTIN) 100 MG capsule, Take 2 capsules (200 mg total) by mouth at bedtime.   Reviewed prior external information including notes and imaging from  primary care provider As well as notes that were available from care everywhere and other healthcare systems.  Past medical history, social, surgical and family history all reviewed in electronic medical record.  No pertanent information unless stated regarding to the chief complaint.   Review of Systems:  No headache, visual changes, nausea, vomiting, diarrhea, constipation, dizziness, abdominal pain, skin rash, fevers, chills, night sweats, weight loss, swollen lymph nodes, body aches, joint swelling, chest pain, shortness of breath, mood changes. POSITIVE muscle aches  Objective  Blood pressure 122/80, pulse (!) 55, height 5\' 6"  (1.676 m), weight 185 lb (83.9 kg), last menstrual period 02/14/2012, SpO2 98 %.   General: No apparent distress alert and  oriented x3 mood and affect normal, dressed appropriately.  HEENT: Pupils equal, extraocular movements intact  Respiratory: Patient's speak in full sentences and does not appear short of breath  Cardiovascular: No lower extremity edema, non tender, no erythema  Neuro: Cranial nerves II through XII are intact, neurovascularly intact in all extremities with 2+ DTRs and 2+ pulses.  Gait normal with good balance and coordination.  MSK:  Non tender with full range of motion and good stability and  symmetric strength and tone of shoulders, elbows, wrist, hip, knee and ankles bilaterally.  Back exam is unremarkable.  Patient has a negative straight leg test.  Patient has full range of motion noted.  Did not review patient's foot and great detail today.   Impression and Recommendations:      The above documentation has been reviewed and is accurate and complete Lyndal Pulley, DO       Note: This dictation was prepared with Dragon dictation along with smaller phrase technology. Any transcriptional errors that result from this process are unintentional.

## 2019-06-24 NOTE — Assessment & Plan Note (Signed)
Patient initially made some improvement with the injection but does have some scar tissue in the area. Will have patient do the PRP injection. We'll set her up for this in the near future. Patient is agreement with the plan

## 2019-06-25 ENCOUNTER — Other Ambulatory Visit: Payer: Self-pay | Admitting: Family Medicine

## 2019-07-02 ENCOUNTER — Ambulatory Visit (INDEPENDENT_AMBULATORY_CARE_PROVIDER_SITE_OTHER): Payer: 59 | Admitting: Family Medicine

## 2019-07-02 ENCOUNTER — Ambulatory Visit (INDEPENDENT_AMBULATORY_CARE_PROVIDER_SITE_OTHER): Payer: 59

## 2019-07-02 ENCOUNTER — Other Ambulatory Visit: Payer: Self-pay

## 2019-07-02 ENCOUNTER — Encounter: Payer: Self-pay | Admitting: Family Medicine

## 2019-07-02 VITALS — BP 128/84 | HR 75 | Ht 66.0 in | Wt 183.0 lb

## 2019-07-02 DIAGNOSIS — M79671 Pain in right foot: Secondary | ICD-10-CM | POA: Diagnosis not present

## 2019-07-02 DIAGNOSIS — G5781 Other specified mononeuropathies of right lower limb: Secondary | ICD-10-CM

## 2019-07-02 NOTE — Patient Instructions (Signed)
No ice or IBU for 3 days See me again in 4-5 weeks

## 2019-07-02 NOTE — Progress Notes (Signed)
Subjective:    Patient ID: Whitney Blake, female    DOB: 1960/07/25, 59 y.o.   MRN: [id number]  HPI She is here for a physical exam.   Her taste/smell is still off since she had covid last June/July.  After getting the vaccine it was temporarily worse and then back to where it was before.    She has no other residual symptoms.   She has gained weight.    Medications and allergies reviewed with patient and updated if appropriate.  Patient Active Problem List   Diagnosis Date Noted  . Smell or taste sensation disturbance 07/03/2019  . Bursitis of right foot 05/01/2019  . Interdigital neuroma of right foot 02/25/2019  . COVID-19 virus infection 09/14/2018  . Leg cramping 07/02/2018  . Unspecified dyspareunia (CODE) 12/11/2017  . Lichen sclerosus of female genitalia 05/04/2017  . Vitamin D deficiency 05/03/2017  . Lumbar radiculopathy 05/15/2016  . Prediabetes 04/29/2016  . Leg pain, bilateral 04/23/2015  . Nephrolithiasis 09/06/2012  . Essential hypertension 12/07/2011  . Migraine 12/21/2008  . Anemia 09/03/2007  . NONSPECIFIC ABNORMAL ELECTROCARDIOGRAM 09/03/2007  . SPINAL STENOSIS 06/14/2006    Current Outpatient Medications on File Prior to Visit  Medication Sig Dispense Refill  . APPLE CIDER VINEGAR PO Take by mouth.    [name] aspirin EC 81 MG tablet Take 81 mg by mouth daily.    . cholecalciferol (VITAMIN D) 1000 units tablet Take 2,000 Units by mouth daily.    [name] gabapentin (NEURONTIN) 100 MG capsule TAKE 2 CAPSULES (200 MG TOTAL) BY MOUTH AT BEDTIME. 60 capsule 3  . Multiple Vitamin (MULTIVITAMIN WITH MINERALS) TABS tablet Take 1 tablet by mouth daily.    [name] telmisartan-hydrochlorothiazide (MICARDIS HCT) 40-12.5 MG tablet TAKE 1 TABLET BY MOUTH EVERY DAY 90 tablet 0  . Zinc 50 MG CAPS Take by mouth.     No current facility-administered medications on file prior to visit.    Past Medical History:  Diagnosis Date  . Anemia    PMH of  . Cervical spinal stenosis     Dr Arnoldo Morale, [place]  . Hypertension   . Internal hemorrhoids   . Migraines    menstrual  . Nonspecific ST-T changes 2007  . Normal nuclear stress test 2007    Past Surgical History:  Procedure Laterality Date  . COLONOSCOPY  2012   neg; [facility]  . CONCUSSION     DUE TO FALL AT AGE 45  . FOOT SURGERY     Right   . LUMBAR LAMINECTOMY  2003   FOR RUPTURED DISC  . ORIF CLAVICULAR FRACTURE     bilaterally @ age 76  . TONSILLECTOMY AND ADENOIDECTOMY      Social History   Socioeconomic History  . Marital status: Married    Spouse name: Not on file  . Number of children: 2  . Years of education: Not on file  . Highest education level: Not on file  Occupational History  . Occupation: Chemical engineer: [employer]  Tobacco Use  . Smoking status: Never Smoker  . Smokeless tobacco: Never Used  Substance and Sexual Activity  . Alcohol use: No  . Drug use: No  . Sexual activity: Not on file  Other Topics Concern  . Not on file  Social History Narrative   Exercises 3 a week    Daily caffeine    Social Determinants of Health   Financial Resource Strain:   . Difficulty of  Paying Living Expenses:   Food Insecurity:   . Worried About Charity fundraiser in the Last Year:   . Arboriculturist in the Last Year:   Transportation Needs:   . Film/video editor (Medical):   [name] Lack of Transportation (Non-Medical):   Physical Activity:   . Days of Exercise per Week:   . Minutes of Exercise per Session:   Stress:   . Feeling of Stress :   Social Connections:   . Frequency of Communication with Friends and Family:   . Frequency of Social Gatherings with Friends and Family:   . Attends Religious Services:   . Active Member of Clubs or Organizations:   . Attends Archivist Meetings:   [name] Marital Status:     Family History  Problem Relation Age of Onset  . Hypertension Mother        bilateral endarterectomies,pacer,mesenteric artery bypass, renal  artery stenosis stented unilaterally, intestinal perforation, steroids for colitis induced osteoprosis, polyps   . Cancer Mother        uterine S/P Tamoxifen  . Transient ischemic attack Mother        in early 52's  . Colon polyps Mother   . Irritable bowel syndrome Mother   . Breast cancer Mother   . Heart disease Mother        pacer  . Asthma Father   . Hypertension Father   . Colon polyps Father   . Heart disease Father        S/P ablation for AF  . Stroke Father        cns bleed on coumadin  . Hypertension Sister        2 sisters  . Cancer Maternal Grandmother        uterine  . Diabetes Maternal Grandmother   . Cancer Sister        cervical  . Colon cancer Neg Hx     Review of Systems  Constitutional: Negative for chills, fatigue and fever.  HENT:       Loss of taste/ smell  Eyes: Negative for visual disturbance.  Respiratory: Negative for cough, shortness of breath and wheezing.   Cardiovascular: Negative for chest pain, palpitations and leg swelling.  Gastrointestinal: Negative for abdominal pain, blood in stool, constipation, diarrhea and nausea.       Mild gerd at night  Genitourinary: Negative for dysuria and hematuria.  Musculoskeletal: Negative for arthralgias and back pain.       Muscle cramping in right leg from back  Skin: Negative for color change and rash.  Neurological: Negative for dizziness, light-headedness and headaches.  Psychiatric/Behavioral: Negative for dysphoric mood. The patient is not nervous/anxious.        Objective:   Vitals:   07/03/19 0908  BP: 134/80  Pulse: (!) 56  Resp: 16  Temp: 98 F (36.7 C)  SpO2: 99%   Filed Weights   07/03/19 0908  Weight: 183 lb (83 kg)   Body mass index is 29.54 kg/m.  BP Readings from Last 3 Encounters:  07/03/19 134/80  07/02/19 128/84  06/24/19 122/80    Wt Readings from Last 3 Encounters:  07/03/19 183 lb (83 kg)  07/02/19 183 lb (83 kg)  06/24/19 185 lb (83.9 kg)     Physical  Exam Constitutional: She appears well-developed and well-nourished. No distress.  HENT:  Head: Normocephalic and atraumatic.  Right Ear: External ear normal. Normal ear canal and TM Left Ear: External  ear normal.  Normal ear canal and TM Mouth/Throat: Oropharynx is clear and moist.  Eyes: Conjunctivae and EOM are normal.  Neck: Neck supple. No tracheal deviation present. No thyromegaly present.  No carotid bruit  Cardiovascular: Normal rate, regular rhythm and normal heart sounds.   No murmur heard.  No edema. Pulmonary/Chest: Effort normal and breath sounds normal. No respiratory distress. She has no wheezes. She has no rales.  Breast: deferred   Abdominal: Soft. She exhibits no distension. There is no tenderness.  Lymphadenopathy: She has no cervical adenopathy.  Skin: Skin is warm and dry. She is not diaphoretic.  Psychiatric: She has a normal mood and affect. Her behavior is normal.        Assessment & Plan:   Physical exam: Screening blood work    ordered Immunizations  Discussed shingrix, had covid, others up to date Colonoscopy  Up to date  Mammogram  Up to date  Gyn  Dr Willis Modena Dexa  Up to date  Eye exams  Up to date  Exercise   Elliptical, walking Weight   overweight Substance abuse  none  See Problem List for Assessment and Plan of chronic medical problems.     This visit occurred during the SARS-CoV-2 public health emergency.  Safety protocols were in place, including screening questions prior to the visit, additional usage of staff PPE, and extensive cleaning of exam room while observing appropriate contact time as indicated for disinfecting solutions.

## 2019-07-02 NOTE — Patient Instructions (Addendum)
Blood work was ordered.    All other Health Maintenance issues reviewed.   All recommended immunizations and age-appropriate screenings are up-to-date or discussed.  No immunization administered today.   Medications reviewed and updated.  Changes include :   none  Your prescription(s) have been submitted to your pharmacy. Please take as directed and contact our office if you believe you are having problem(s) with the medication(s).  A referral was ordered for ENT.  They will call you to schedule.   Please followup in 1 year    Health Maintenance, Female Adopting a healthy lifestyle and getting preventive care are important in promoting health and wellness. Ask your health care provider about:  The right schedule for you to have regular tests and exams.  Things you can do on your own to prevent diseases and keep yourself healthy. What should I know about diet, weight, and exercise? Eat a healthy diet   Eat a diet that includes plenty of vegetables, fruits, low-fat dairy products, and lean protein.  Do not eat a lot of foods that are high in solid fats, added sugars, or sodium. Maintain a healthy weight Body mass index (BMI) is used to identify weight problems. It estimates body fat based on height and weight. Your health care provider can help determine your BMI and help you achieve or maintain a healthy weight. Get regular exercise Get regular exercise. This is one of the most important things you can do for your health. Most adults should:  Exercise for at least 150 minutes each week. The exercise should increase your heart rate and make you sweat (moderate-intensity exercise).  Do strengthening exercises at least twice a week. This is in addition to the moderate-intensity exercise.  Spend less time sitting. Even light physical activity can be beneficial. Watch cholesterol and blood lipids Have your blood tested for lipids and cholesterol at 60 years of age, then have this  test every 5 years. Have your cholesterol levels checked more often if:  Your lipid or cholesterol levels are high.  You are older than 60 years of age.  You are at high risk for heart disease. What should I know about cancer screening? Depending on your health history and family history, you may need to have cancer screening at various ages. This may include screening for:  Breast cancer.  Cervical cancer.  Colorectal cancer.  Skin cancer.  Lung cancer. What should I know about heart disease, diabetes, and high blood pressure? Blood pressure and heart disease  High blood pressure causes heart disease and increases the risk of stroke. This is more likely to develop in people who have high blood pressure readings, are of African descent, or are overweight.  Have your blood pressure checked: ? Every 3-5 years if you are 31-103 years of age. ? Every year if you are 41 years old or older. Diabetes Have regular diabetes screenings. This checks your fasting blood sugar level. Have the screening done:  Once every three years after age 23 if you are at a normal weight and have a low risk for diabetes.  More often and at a younger age if you are overweight or have a high risk for diabetes. What should I know about preventing infection? Hepatitis B If you have a higher risk for hepatitis B, you should be screened for this virus. Talk with your health care provider to find out if you are at risk for hepatitis B infection. Hepatitis C Testing is recommended for:  Everyone  born from [birth year] through 1965.  Anyone with known risk factors for hepatitis C. Sexually transmitted infections (STIs)  Get screened for STIs, including gonorrhea and chlamydia, if: ? You are sexually active and are younger than 60 years of age. ? You are older than 60 years of age and your health care provider tells you that you are at risk for this type of infection. ? Your sexual activity has changed since you  were last screened, and you are at increased risk for chlamydia or gonorrhea. Ask your health care provider if you are at risk.  Ask your health care provider about whether you are at high risk for HIV. Your health care provider may recommend a prescription medicine to help prevent HIV infection. If you choose to take medicine to prevent HIV, you should first get tested for HIV. You should then be tested every 3 months for as long as you are taking the medicine. Pregnancy  If you are about to stop having your period (premenopausal) and you may become pregnant, seek counseling before you get pregnant.  Take 400 to 800 micrograms (mcg) of folic acid every day if you become pregnant.  Ask for birth control (contraception) if you want to prevent pregnancy. Osteoporosis and menopause Osteoporosis is a disease in which the bones lose minerals and strength with aging. This can result in bone fractures. If you are 75 years old or older, or if you are at risk for osteoporosis and fractures, ask your health care provider if you should:  Be screened for bone loss.  Take a calcium or vitamin D supplement to lower your risk of fractures.  Be given hormone replacement therapy (HRT) to treat symptoms of menopause. Follow these instructions at home: Lifestyle  Do not use any products that contain nicotine or tobacco, such as cigarettes, e-cigarettes, and chewing tobacco. If you need help quitting, ask your health care provider.  Do not use street drugs.  Do not share needles.  Ask your health care provider for help if you need support or information about quitting drugs. Alcohol use  Do not drink alcohol if: ? Your health care provider tells you not to drink. ? You are pregnant, may be pregnant, or are planning to become pregnant.  If you drink alcohol: ? Limit how much you use to 0-1 drink a day. ? Limit intake if you are breastfeeding.  Be aware of how much alcohol is in your drink. In the  [place] one drink equals one 12 oz bottle of beer (355 mL), one 5 oz glass of wine (148 mL), or one 1 oz glass of hard liquor (44 mL). General instructions  Schedule regular health, dental, and eye exams.  Stay current with your vaccines.  Tell your health care provider if: ? You often feel depressed. ? You have ever been abused or do not feel safe at home. Summary  Adopting a healthy lifestyle and getting preventive care are important in promoting health and wellness.  Follow your health care provider's instructions about healthy diet, exercising, and getting tested or screened for diseases.  Follow your health care provider's instructions on monitoring your cholesterol and blood pressure. This information is not intended to replace advice given to you by your health care provider. Make sure you discuss any questions you have with your health care provider. Document Revised: 01/23/2018 Document Reviewed: 01/23/2018 Elsevier Patient Education  2020 [employer].

## 2019-07-02 NOTE — Progress Notes (Signed)
[place] Phone: [phone] Subjective:    I'm seeing this patient by the request  of:  Binnie Rail, MD  CC: Right foot pain  [id number]   06/24/2019 Patient initially made some improvement with the injection but does have some scar tissue in the area. Will have patient do the PRP injection. We'll set her up for this in the near future. Patient is agreement with the plan  Update 07/02/2019 Whitney Blake is a 59 y.o. female coming in with complaint of right foot pain. Patient states that she continues to have constant pain.  Patient conservative therapy as well as surgical intervention for neuroma.  Here for PRP injection      Past Medical History:  Diagnosis Date  . Anemia    PMH of  . Cervical spinal stenosis    Dr Arnoldo Morale, [place]  . Hypertension   . Internal hemorrhoids   . Migraines    menstrual  . Nonspecific ST-T changes 2007  . Normal nuclear stress test 2007   Past Surgical History:  Procedure Laterality Date  . COLONOSCOPY  2012   neg; [facility]  . CONCUSSION     DUE TO FALL AT AGE 77  . FOOT SURGERY     Right   . LUMBAR LAMINECTOMY  2003   FOR RUPTURED DISC  . ORIF CLAVICULAR FRACTURE     bilaterally @ age 15  . TONSILLECTOMY AND ADENOIDECTOMY     Social History   Socioeconomic History  . Marital status: Married    Spouse name: Not on file  . Number of children: 2  . Years of education: Not on file  . Highest education level: Not on file  Occupational History  . Occupation: Chemical engineer: [employer]  Tobacco Use  . Smoking status: Never Smoker  . Smokeless tobacco: Never Used  Substance and Sexual Activity  . Alcohol use: No  . Drug use: No  . Sexual activity: Not on file  Other Topics Concern  . Not on file  Social History Narrative   Exercises 3 a week    Daily caffeine    Social Determinants of Health   Financial Resource Strain:   .  Difficulty of Paying Living Expenses:   Food Insecurity:   . Worried About Charity fundraiser in the Last Year:   . Arboriculturist in the Last Year:   Transportation Needs:   . Film/video editor (Medical):   [name] Lack of Transportation (Non-Medical):   Physical Activity:   . Days of Exercise per Week:   . Minutes of Exercise per Session:   Stress:   . Feeling of Stress :   Social Connections:   . Frequency of Communication with Friends and Family:   . Frequency of Social Gatherings with Friends and Family:   . Attends Religious Services:   . Active Member of Clubs or Organizations:   . Attends Archivist Meetings:   [name] Marital Status:    Allergies  Allergen Reactions  . Rofecoxib Swelling    Vioxx caused  swelling of hands& feet  . Coreg [Carvedilol] Other (See Comments)    Caused hot flashes   Family History  Problem Relation Age of Onset  . Hypertension Mother        bilateral endarterectomies,pacer,mesenteric artery bypass, renal artery stenosis stented unilaterally, intestinal perforation, steroids for colitis induced osteoprosis, polyps   .  Cancer Mother        uterine S/P Tamoxifen  . Transient ischemic attack Mother        in early 92's  . Colon polyps Mother   . Irritable bowel syndrome Mother   . Breast cancer Mother   . Heart disease Mother        pacer  . Asthma Father   . Hypertension Father   . Colon polyps Father   . Heart disease Father        S/P ablation for AF  . Stroke Father        cns bleed on coumadin  . Hypertension Sister        2 sisters  . Cancer Maternal Grandmother        uterine  . Diabetes Maternal Grandmother   . Cancer Sister        cervical  . Colon cancer Neg Hx      Current Outpatient Medications (Cardiovascular):  .  telmisartan-hydrochlorothiazide (MICARDIS HCT) 40-12.5 MG tablet, TAKE 1 TABLET BY MOUTH EVERY DAY  Current Outpatient Medications (Respiratory):  .  fluticasone (FLONASE) 50 MCG/ACT nasal  spray, Place 2 sprays into both nostrils daily.   Current Outpatient Medications (Hematological):  .  vitamin B-12 (CYANOCOBALAMIN) 1000 MCG tablet, Take 1 tablet (1,000 mcg total) by mouth daily.  Current Outpatient Medications (Other):  .  cholecalciferol (VITAMIN D) 1000 units tablet, Take 2,000 Units by mouth daily. [name]  erythromycin ophthalmic ointment, Place a 1/2 inch ribbon of ointment into the lower eyelid 4 times a day for 7 days .  gabapentin (NEURONTIN) 100 MG capsule, TAKE 2 CAPSULES (200 MG TOTAL) BY MOUTH AT BEDTIME.   Reviewed prior external information including notes and imaging from  primary care provider As well as notes that were available from care everywhere and other healthcare systems.  Past medical history, social, surgical and family history all reviewed in electronic medical record.  No pertanent information unless stated regarding to the chief complaint.   Review of Systems:  No headache, visual changes, nausea, vomiting, diarrhea, constipation, dizziness, abdominal pain, skin rash, fevers, chills, night sweats, weight loss, swollen lymph nodes, body aches, joint swelling, chest pain, shortness of breath, mood changes. POSITIVE muscle aches  Objective  Blood pressure 128/84, pulse 75, height 5\' 6"  (1.676 m), weight 183 lb (83 kg), last menstrual period 02/14/2012, SpO2 98 %.   General: No apparent distress alert and oriented x3 mood and affect normal, dressed appropriately.  HEENT: Pupils equal, extraocular movements intact  Respiratory: Patient's speak in full sentences and does not appear short of breath  Cardiovascular: No lower extremity edema, non tender, no erythema  Neuro: Cranial nerves II through XII are intact, neurovascularly intact in all extremities with 2+ DTRs and 2+ pulses.  Gait normal with good balance and coordination.  MSK:  Non tender with full range of motion and good stability and symmetric strength and tone of shoulders, elbows, wrist,  hip, knee and ankles bilaterally.  Foot exam shows the patient does have scarring between the third and fourth toe from previous surgery.  Tender to palpation in this area significantly.  Patient has mild loss of range of motion of the forefoot noted.  Positive squeeze test.  Procedure: Real-time Ultrasound Guided Injection of right foot neuroma Device: GE Logiq Q7 Ultrasound guided injection is preferred based studies that show increased duration, increased effect, greater accuracy, decreased procedural pain, increased response rate, and decreased cost with ultrasound guided versus blind injection.  Verbal informed consent obtained.  Time-out conducted.  Noted no overlying erythema, induration, or other signs of local infection.  Skin prepped in a sterile fashion.  Local anesthesia: Topical Ethyl chloride.  With sterile technique and under real time ultrasound guidance: With a 21-gauge 2 inch needle injected with 0.5 cc of 0.5% Marcaine then injected with 3 cc of PRP that was centrifuge into the neural sheath as well as into the surrounding scar tissue. Completed without difficulty  Pain immediately resolved suggesting accurate placement of the medication.  Advised to call if fevers/chills, erythema, induration, drainage, or persistent bleeding.  Images permanently stored and available for review in the ultrasound unit.  Impression: Technically successful ultrasound guided injection.   Impression and Recommendations:     This case required medical decision making of moderate complexity. The above documentation has been reviewed and is accurate and complete Lyndal Pulley, DO       Note: This dictation was prepared with Dragon dictation along with smaller phrase technology. Any transcriptional errors that result from this process are unintentional.

## 2019-07-02 NOTE — Assessment & Plan Note (Signed)
PRP injected today.  Patient knows to avoid ice and anti-inflammatories.  Discussed return to progression of working out over the course of the next several weeks.  I believe patient will do relatively well.  Follow-up in office in 4 to 6 weeks otherwise.

## 2019-07-03 ENCOUNTER — Encounter: Payer: Self-pay | Admitting: Internal Medicine

## 2019-07-03 ENCOUNTER — Other Ambulatory Visit: Payer: Self-pay

## 2019-07-03 ENCOUNTER — Ambulatory Visit (INDEPENDENT_AMBULATORY_CARE_PROVIDER_SITE_OTHER): Payer: 59 | Admitting: Internal Medicine

## 2019-07-03 VITALS — BP 134/80 | HR 56 | Temp 98.0°F | Resp 16 | Ht 66.0 in | Wt 183.0 lb

## 2019-07-03 DIAGNOSIS — R439 Unspecified disturbances of smell and taste: Secondary | ICD-10-CM | POA: Insufficient documentation

## 2019-07-03 DIAGNOSIS — R7303 Prediabetes: Secondary | ICD-10-CM

## 2019-07-03 DIAGNOSIS — Z0000 Encounter for general adult medical examination without abnormal findings: Secondary | ICD-10-CM | POA: Diagnosis not present

## 2019-07-03 DIAGNOSIS — I10 Essential (primary) hypertension: Secondary | ICD-10-CM

## 2019-07-03 LAB — CBC WITH DIFFERENTIAL/PLATELET
Basophils Absolute: 0 10*3/uL (ref 0.0–0.1)
Basophils Relative: 0.6 % (ref 0.0–3.0)
Eosinophils Absolute: 0.1 10*3/uL (ref 0.0–0.7)
Eosinophils Relative: 1.9 % (ref 0.0–5.0)
HCT: 41.2 % (ref 36.0–46.0)
Hemoglobin: 13.8 g/dL (ref 12.0–15.0)
Lymphocytes Relative: 43.9 % (ref 12.0–46.0)
Lymphs Abs: 3.3 10*3/uL (ref 0.7–4.0)
MCHC: 33.5 g/dL (ref 30.0–36.0)
MCV: 89.6 fl (ref 78.0–100.0)
Monocytes Absolute: 0.5 10*3/uL (ref 0.1–1.0)
Monocytes Relative: 6.1 % (ref 3.0–12.0)
Neutro Abs: 3.6 10*3/uL (ref 1.4–7.7)
Neutrophils Relative %: 47.5 % (ref 43.0–77.0)
Platelets: 244 10*3/uL (ref 150.0–400.0)
RBC: 4.6 Mil/uL (ref 3.87–5.11)
RDW: 13.9 % (ref 11.5–15.5)
WBC: 7.6 10*3/uL (ref 4.0–10.5)

## 2019-07-03 LAB — COMPREHENSIVE METABOLIC PANEL
ALT: 28 U/L (ref 0–35)
AST: 20 U/L (ref 0–37)
Albumin: 4.3 g/dL (ref 3.5–5.2)
Alkaline Phosphatase: 59 U/L (ref 39–117)
BUN: 20 mg/dL (ref 6–23)
CO2: 32 mEq/L (ref 19–32)
Calcium: 9.5 mg/dL (ref 8.4–10.5)
Chloride: 103 mEq/L (ref 96–112)
Creatinine, Ser: 0.95 mg/dL (ref 0.40–1.20)
GFR: 60.01 mL/min (ref >60.00)
Glucose, Bld: 97 mg/dL (ref 70–99)
Potassium: 4.2 mEq/L (ref 3.5–5.1)
Sodium: 139 mEq/L (ref 135–145)
Total Bilirubin: 0.3 mg/dL (ref 0.2–1.2)
Total Protein: 7.1 g/dL (ref 6.0–8.3)

## 2019-07-03 LAB — LIPID PANEL
Cholesterol: 210 mg/dL — ABNORMAL HIGH (ref 0–200)
HDL: 55.6 mg/dL (ref >39.00)
LDL Cholesterol: 132 mg/dL — ABNORMAL HIGH (ref 0–99)
NonHDL: 154.33
Total CHOL/HDL Ratio: 4
Triglycerides: 111 mg/dL (ref 0.0–149.0)
VLDL: 22.2 mg/dL (ref 0.0–40.0)

## 2019-07-03 LAB — TSH: TSH: 2.58 u[IU]/mL (ref 0.35–4.50)

## 2019-07-03 LAB — HEMOGLOBIN A1C: Hgb A1c MFr Bld: 5.6 % (ref 4.6–6.5)

## 2019-07-03 NOTE — Assessment & Plan Note (Signed)
Chronic BP well controlled Current regimen effective and well tolerated Continue current medications at current doses cmp  

## 2019-07-03 NOTE — Assessment & Plan Note (Signed)
Chronic Related to covid Doing a study using essential oils  -- it helped a little Has not tried flonase Will refer to ENT for there advise

## 2019-07-03 NOTE — Assessment & Plan Note (Signed)
Chronic Check a1c Low sugar / carb diet Stressed regular exercise  

## 2019-07-23 ENCOUNTER — Ambulatory Visit: Payer: 59 | Admitting: Family Medicine

## 2019-07-28 ENCOUNTER — Encounter: Payer: Self-pay | Admitting: Internal Medicine

## 2019-07-29 NOTE — Telephone Encounter (Signed)
Pt is scheduled °

## 2019-07-30 ENCOUNTER — Encounter: Payer: Self-pay | Admitting: Family Medicine

## 2019-07-30 ENCOUNTER — Ambulatory Visit: Payer: 59 | Admitting: Family Medicine

## 2019-07-30 ENCOUNTER — Other Ambulatory Visit: Payer: Self-pay

## 2019-07-30 DIAGNOSIS — G5781 Other specified mononeuropathies of right lower limb: Secondary | ICD-10-CM

## 2019-07-30 NOTE — Patient Instructions (Addendum)
Good to see you Hooka or oofos recovery sandals Keep up everything else  See me again in 2 months

## 2019-07-30 NOTE — Assessment & Plan Note (Signed)
Significant improvement already at this time.  I believe that patient will do well with conservative therapy.  Follow-up again in 2 months if not completely resolved at that time.

## 2019-07-30 NOTE — Progress Notes (Signed)
[facility] [address] [place] Phone: [phone] Subjective:   I Whitney Blake am serving as a Education administrator for Dr. Hulan Saas.  This visit occurred during the SARS-CoV-2 public health emergency.  Safety protocols were in place, including screening questions prior to the visit, additional usage of staff PPE, and extensive cleaning of exam room while observing appropriate contact time as indicated for disinfecting solutions.   I'm seeing this patient by the request  of:  Binnie Rail, MD  CC: Right foot pain follow-up  TFT:[id number]   07/02/2019 PRP injected today.  Patient knows to avoid ice and anti-inflammatories.  Discussed return to progression of working out over the course of the next several weeks.  I believe patient will do relatively well.  Follow-up in office in 4 to 6 weeks otherwise.  07/30/2019 Whitney Blake is a 59 y.o. female coming in with complaint of right foot pain. Patient states she has made lots of improvement. Still some numbness in the toes.   Patient states that I would feel approximately 75% better.  Happy with the result so far.     Past Medical History:  Diagnosis Date  . Anemia    PMH of  . Cervical spinal stenosis    Dr Arnoldo Morale, [place]  . Hypertension   . Internal hemorrhoids   . Migraines    menstrual  . Nonspecific ST-T changes 2007  . Normal nuclear stress test 2007   Past Surgical History:  Procedure Laterality Date  . COLONOSCOPY  2012   neg; [facility]  . CONCUSSION     DUE TO FALL AT AGE 66  . FOOT SURGERY     Right   . LUMBAR LAMINECTOMY  2003   FOR RUPTURED DISC  . ORIF CLAVICULAR FRACTURE     bilaterally @ age 84  . TONSILLECTOMY AND ADENOIDECTOMY     Social History   Socioeconomic History  . Marital status: Married    Spouse name: Not on file  . Number of children: 2  . Years of education: Not on file  . Highest education level: Not on file  Occupational History  .  Occupation: Chemical engineer: [employer]  Tobacco Use  . Smoking status: Never Smoker  . Smokeless tobacco: Never Used  Substance and Sexual Activity  . Alcohol use: No  . Drug use: No  . Sexual activity: Not on file  Other Topics Concern  . Not on file  Social History Narrative   Exercises 3 a week    Daily caffeine    Social Determinants of Health   Financial Resource Strain:   . Difficulty of Paying Living Expenses:   Food Insecurity:   . Worried About Charity fundraiser in the Last Year:   . Arboriculturist in the Last Year:   Transportation Needs:   . Film/video editor (Medical):   [name] Lack of Transportation (Non-Medical):   Physical Activity:   . Days of Exercise per Week:   . Minutes of Exercise per Session:   Stress:   . Feeling of Stress :   Social Connections:   . Frequency of Communication with Friends and Family:   . Frequency of Social Gatherings with Friends and Family:   . Attends Religious Services:   . Active Member of Clubs or Organizations:   . Attends Archivist Meetings:   [name] Marital Status:    Allergies  Allergen Reactions  . Rofecoxib Swelling    Vioxx caused  swelling of hands& feet  . Coreg [Carvedilol] Other (See Comments)    Caused hot flashes   Family History  Problem Relation Age of Onset  . Hypertension Mother        bilateral endarterectomies,pacer,mesenteric artery bypass, renal artery stenosis stented unilaterally, intestinal perforation, steroids for colitis induced osteoprosis, polyps   . Cancer Mother        uterine S/P Tamoxifen  . Transient ischemic attack Mother        in early 65's  . Colon polyps Mother   . Irritable bowel syndrome Mother   . Breast cancer Mother   . Heart disease Mother        pacer  . Asthma Father   . Hypertension Father   . Colon polyps Father   . Heart disease Father        S/P ablation for AF  . Stroke Father        cns bleed on coumadin  . Hypertension Sister         2 sisters  . Cancer Maternal Grandmother        uterine  . Diabetes Maternal Grandmother   . Cancer Sister        cervical  . Colon cancer Neg Hx      Current Outpatient Medications (Cardiovascular):  .  telmisartan-hydrochlorothiazide (MICARDIS HCT) 40-12.5 MG tablet, TAKE 1 TABLET BY MOUTH EVERY DAY   Current Outpatient Medications (Analgesics):  .  aspirin EC 81 MG tablet, Take 81 mg by mouth daily.   Current Outpatient Medications (Other):  [name]  APPLE CIDER VINEGAR PO, Take by mouth. .  cholecalciferol (VITAMIN D) 1000 units tablet, Take 2,000 Units by mouth daily. [name]  gabapentin (NEURONTIN) 100 MG capsule, TAKE 2 CAPSULES (200 MG TOTAL) BY MOUTH AT BEDTIME. .  Multiple Vitamin (MULTIVITAMIN WITH MINERALS) TABS tablet, Take 1 tablet by mouth daily. .  Zinc 50 MG CAPS, Take by mouth.   Reviewed prior external information including notes and imaging from  primary care provider As well as notes that were available from care everywhere and other healthcare systems.  Past medical history, social, surgical and family history all reviewed in electronic medical record.  No pertanent information unless stated regarding to the chief complaint.   Review of Systems:  No headache, visual changes, nausea, vomiting, diarrhea, constipation, dizziness, abdominal pain, skin rash, fevers, chills, night sweats, weight loss, swollen lymph nodes, body aches, joint swelling, chest pain, shortness of breath, mood changes. POSITIVE muscle aches  Objective  Blood pressure 130/80, pulse (!) 59, height 5\' 6"  (1.676 m), weight 184 lb (83.5 kg), last menstrual period 02/14/2012, SpO2 97 %.   General: No apparent distress alert and oriented x3 mood and affect normal, dressed appropriately.  HEENT: Pupils equal, extraocular movements intact  Respiratory: Patient's speak in full sentences and does not appear short of breath  Cardiovascular: No lower extremity edema, non tender, no erythema  Neuro:  Cranial nerves II through XII are intact, neurovascularly intact in all extremities with 2+ DTRs and 2+ pulses.  Gait normal with good balance and coordination.  MSK:  Non tender with full range of motion and good stability and symmetric strength and tone of shoulders, elbows, wrist, hip, knee and ankles bilaterally.  Foot exam shows the patient does still have breakdown of the transverse arch.  Patient is no longer tender along the plantar aspect near the toes anymore.  Still  some mild pain with squeeze test.   Impression and Recommendations:     The above documentation has been reviewed and is accurate and complete Lyndal Pulley, DO       Note: This dictation was prepared with Dragon dictation along with smaller phrase technology. Any transcriptional errors that result from this process are unintentional.

## 2019-08-05 ENCOUNTER — Encounter: Payer: Self-pay | Admitting: Internal Medicine

## 2019-08-05 NOTE — Progress Notes (Signed)
Outside notes received. Information abstracted. Notes sent to scan.  

## 2019-08-13 ENCOUNTER — Ambulatory Visit (INDEPENDENT_AMBULATORY_CARE_PROVIDER_SITE_OTHER): Payer: 59 | Admitting: Otolaryngology

## 2019-08-21 ENCOUNTER — Other Ambulatory Visit: Payer: Self-pay

## 2019-08-21 ENCOUNTER — Ambulatory Visit (INDEPENDENT_AMBULATORY_CARE_PROVIDER_SITE_OTHER): Payer: 59 | Admitting: Otolaryngology

## 2019-08-21 ENCOUNTER — Encounter (INDEPENDENT_AMBULATORY_CARE_PROVIDER_SITE_OTHER): Payer: Self-pay | Admitting: Otolaryngology

## 2019-08-21 VITALS — Temp 97.9°F

## 2019-08-21 DIAGNOSIS — R432 Parageusia: Secondary | ICD-10-CM

## 2019-08-21 DIAGNOSIS — R439 Unspecified disturbances of smell and taste: Secondary | ICD-10-CM | POA: Diagnosis not present

## 2019-08-21 NOTE — Progress Notes (Signed)
HPI: Whitney Blake is a 59 y.o. female who presents is referred by Dr. Quay Burow for evaluation of Covid related taste and smell abnormalities.  Yulisa as well as her family contracted Covid in July of last year.  Her husband required hospitalization for several weeks at the [facility] hospital.  Her daughter was sick for several weeks.  She lost her sense of smell and taste when she contracted Covid.  The smell and taste of some carbs and sweets has been restored and she is eating too many carbs and sweets and gaining weight as she is not as active as she used to be.  When she had a vaccine performed in April her smell became worse again.  She used to love to drink coffee but now cannot tolerate the smell of coffee as well as onions.  She has participated in some studies with regaining smell by using certain oils with pleasant odors that she recalls. She denies any trouble breathing through her nose. She does not smoke.[name]  Past Medical History:  Diagnosis Date  . Anemia    PMH of  . Cervical spinal stenosis    Dr Arnoldo Morale, [place]  . Hypertension   . Internal hemorrhoids   . Migraines    menstrual  . Nonspecific ST-T changes 2007  . Normal nuclear stress test 2007   Past Surgical History:  Procedure Laterality Date  . COLONOSCOPY  2012   neg; [facility]  . CONCUSSION     DUE TO FALL AT AGE 61  . FOOT SURGERY     Right   . LUMBAR LAMINECTOMY  2003   FOR RUPTURED DISC  . ORIF CLAVICULAR FRACTURE     bilaterally @ age 47  . TONSILLECTOMY AND ADENOIDECTOMY     Social History   Socioeconomic History  . Marital status: Married    Spouse name: Not on file  . Number of children: 2  . Years of education: Not on file  . Highest education level: Not on file  Occupational History  . Occupation: Chemical engineer: [employer]  Tobacco Use  . Smoking status: Never Smoker  . Smokeless tobacco: Never Used  Substance and Sexual Activity  . Alcohol use: No  . Drug use: No  . Sexual  activity: Not on file  Other Topics Concern  . Not on file  Social History Narrative   Exercises 3 a week    Daily caffeine    Social Determinants of Health   Financial Resource Strain:   . Difficulty of Paying Living Expenses:   Food Insecurity:   . Worried About Charity fundraiser in the Last Year:   . Arboriculturist in the Last Year:   Transportation Needs:   . Film/video editor (Medical):   [name] Lack of Transportation (Non-Medical):   Physical Activity:   . Days of Exercise per Week:   . Minutes of Exercise per Session:   Stress:   . Feeling of Stress :   Social Connections:   . Frequency of Communication with Friends and Family:   . Frequency of Social Gatherings with Friends and Family:   . Attends Religious Services:   . Active Member of Clubs or Organizations:   . Attends Archivist Meetings:   [name] Marital Status:    Family History  Problem Relation Age of Onset  . Hypertension Mother        bilateral endarterectomies,pacer,mesenteric artery bypass, renal artery stenosis stented  unilaterally, intestinal perforation, steroids for colitis induced osteoprosis, polyps   . Cancer Mother        uterine S/P Tamoxifen  . Transient ischemic attack Mother        in early 55's  . Colon polyps Mother   . Irritable bowel syndrome Mother   . Breast cancer Mother   . Heart disease Mother        pacer  . Asthma Father   . Hypertension Father   . Colon polyps Father   . Heart disease Father        S/P ablation for AF  . Stroke Father        cns bleed on coumadin  . Hypertension Sister        2 sisters  . Cancer Maternal Grandmother        uterine  . Diabetes Maternal Grandmother   . Cancer Sister        cervical  . Colon cancer Neg Hx    Allergies  Allergen Reactions  . Rofecoxib Swelling    Vioxx caused  swelling of hands& feet  . Coreg [Carvedilol] Other (See Comments)    Caused hot flashes   Prior to Admission medications   Medication Sig Start  Date End Date Taking? Authorizing Provider  APPLE CIDER VINEGAR PO Take by mouth.   Yes [provider]  aspirin EC 81 MG tablet Take 81 mg by mouth daily.   Yes [provider]  cholecalciferol (VITAMIN D) 1000 units tablet Take 2,000 Units by mouth daily.   Yes [provider]  gabapentin (NEURONTIN) 100 MG capsule TAKE 2 CAPSULES (200 MG TOTAL) BY MOUTH AT BEDTIME. 06/25/19  Yes Lyndal Pulley, DO  Multiple Vitamin (MULTIVITAMIN WITH MINERALS) TABS tablet Take 1 tablet by mouth daily.   Yes [provider]  telmisartan-hydrochlorothiazide (MICARDIS HCT) 40-12.5 MG tablet TAKE 1 TABLET BY MOUTH EVERY DAY 06/11/19  Yes Burns, Claudina Lick, MD  Zinc 50 MG CAPS Take by mouth.   Yes [provider]     Positive ROS: Otherwise negative  All other systems have been reviewed and were otherwise negative with the exception of those mentioned in the HPI and as above.  Physical Exam: Constitutional: Alert, well-appearing, no acute distress Ears: External ears without lesions or tenderness. Ear canals are clear bilaterally with intact, clear TMs.  Nasal: External nose without lesions. Septum essentially midline with mild rhinitis.  Both middle meatus regions were clear with no signs of infection.  Superior nasal cavity was clear with no polyps or obstructing lesions noted.. Clear nasal passages. Oral: Lips and gums without lesions. Tongue and palate mucosa without lesions. Posterior oropharynx clear. Neck: No palpable adenopathy or masses Respiratory: Breathing comfortably  Skin: No facial/neck lesions or rash noted.  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  Hopefully this will gradually improve with time.  Use of nasal steroid sprays may help improve nasal congestion and allow better airflow to the roof of the nasal cavity where the smell  nerves are located and suggested using Flonase or Nasacort 2 sprays each nostril at night. Otherwise there is no specific treatment for this but with time the smell and taste should improve.   Radene Journey, MD   CC:

## 2019-09-08 ENCOUNTER — Other Ambulatory Visit: Payer: Self-pay | Admitting: Internal Medicine

## 2019-09-25 ENCOUNTER — Encounter: Payer: Self-pay | Admitting: Family Medicine

## 2019-09-30 ENCOUNTER — Ambulatory Visit: Payer: 59 | Admitting: Family Medicine

## 2019-11-05 ENCOUNTER — Other Ambulatory Visit: Payer: Self-pay

## 2019-11-05 ENCOUNTER — Ambulatory Visit (INDEPENDENT_AMBULATORY_CARE_PROVIDER_SITE_OTHER): Payer: 59 | Admitting: Nurse Practitioner

## 2019-11-05 VITALS — BP 130/82 | HR 60 | Temp 97.1°F | Ht 66.0 in | Wt 185.0 lb

## 2019-11-05 DIAGNOSIS — R439 Unspecified disturbances of smell and taste: Secondary | ICD-10-CM | POA: Insufficient documentation

## 2019-11-05 DIAGNOSIS — R413 Other amnesia: Secondary | ICD-10-CM | POA: Diagnosis not present

## 2019-11-05 DIAGNOSIS — Z8616 Personal history of COVID-19: Secondary | ICD-10-CM

## 2019-11-05 NOTE — Progress Notes (Signed)
@Patient  ID: Whitney Blake, female    DOB: 11-Sep-1960, 59 y.o.   MRN: [id number]  Chief Complaint  Patient presents with  . Post COVID    Pos: 08/2018 Sx: bad taste and smell with meats, fruits, coffee. memory issues    Referring provider: Binnie Rail, MD   59 year old female with history of migraines, hypertension, anemia. Diagnosed with Covid July 2020.  HPI  Patient presents today for post COVID care clinic visit.  She was diagnosed with Covid around July 2020.  She reports having ongoing issues with memory.  She states that she has a hard time recalling people's names and certain words.  She also complains today of ongoing altered taste and smell.  She states that there are certain things that she cannot smell at all and other things have a very bad smell to her.  Her taste is also altered in the same way. Denies f/c/s, n/v/d, hemoptysis, PND, chest pain or edema.       Allergies  Allergen Reactions  . Rofecoxib Swelling    Vioxx caused  swelling of hands& feet  . Coreg [Carvedilol] Other (See Comments)    Caused hot flashes    Immunization History  Administered Date(s) Administered  . Influenza Whole 10/15/2011  . Influenza,inj,Quad PF,6+ Mos 12/11/2017, 10/30/2018  . Influenza,inj,quad, With Preservative 12/13/2016  . Influenza-Unspecified 12/28/2012, 11/10/2013, 11/14/2014, 12/13/2016  . Td 09/03/2007  . Tdap 07/02/2018    Past Medical History:  Diagnosis Date  . Anemia    PMH of  . Cervical spinal stenosis    Dr Arnoldo Morale, [place]  . Hypertension   . Internal hemorrhoids   . Migraines    menstrual  . Nonspecific ST-T changes 2007  . Normal nuclear stress test 2007    Tobacco History: Social History   Tobacco Use  Smoking Status Never Smoker  Smokeless Tobacco Never Used   Counseling given: Not Answered   Outpatient Encounter Medications as of 11/05/2019  Medication Sig  . aspirin EC 81 MG tablet Take 81 mg by mouth daily.  . cholecalciferol  (VITAMIN D) 1000 units tablet Take 2,000 Units by mouth daily.  [name] gabapentin (NEURONTIN) 100 MG capsule TAKE 2 CAPSULES (200 MG TOTAL) BY MOUTH AT BEDTIME.  . Multiple Vitamin (MULTIVITAMIN WITH MINERALS) TABS tablet Take 1 tablet by mouth daily.  [name] telmisartan-hydrochlorothiazide (MICARDIS HCT) 40-12.5 MG tablet TAKE 1 TABLET BY MOUTH EVERY DAY  . Zinc 50 MG CAPS Take by mouth.  . APPLE CIDER VINEGAR PO Take by mouth. (Patient not taking: Reported on 11/05/2019)   No facility-administered encounter medications on file as of 11/05/2019.     Review of Systems  Review of Systems  Constitutional: Negative.  Negative for fatigue and fever.  HENT: Negative.   Respiratory: Negative for cough and shortness of breath.   Cardiovascular: Negative.   Gastrointestinal: Negative.   Allergic/Immunologic: Negative.   Neurological: Negative.        Altered taste and smell, memory loss  Psychiatric/Behavioral: Negative.        Physical Exam  BP 130/82 (BP Location: Right Arm)   Pulse 60   Temp (!) 97.1 F (36.2 C)   Ht 5\' 6"  (1.676 m)   Wt 185 lb (83.9 kg)   LMP 02/14/2012   SpO2 98%   BMI 29.86 kg/m   Wt Readings from Last 5 Encounters:  11/05/19 185 lb (83.9 kg)  07/30/19 184 lb (83.5 kg)  07/03/19 183 lb (83 kg)  07/02/19 183 lb (83 kg)  06/24/19 185 lb (83.9 kg)     Physical Exam Vitals and nursing note reviewed.  Constitutional:      General: She is not in acute distress.    Appearance: She is well-developed.  Cardiovascular:     Rate and Rhythm: Normal rate and regular rhythm.  Pulmonary:     Effort: Pulmonary effort is normal.     Breath sounds: Normal breath sounds.  Neurological:     Mental Status: She is alert and oriented to person, place, and time.       Assessment & Plan:   History of COVID-19 Loss of taste and smell Memory loss:  Will place referral to Dr. Brett Fairy neurology  Hand out given on memory care tips    Follow up:  Follow up as  needed      Fenton Foy, NP 11/05/2019

## 2019-11-05 NOTE — Assessment & Plan Note (Signed)
Loss of taste and smell Memory loss:  Will place referral to Dr. Brett Fairy neurology  Hand out given on memory care tips    Follow up:  Follow up as needed

## 2019-11-05 NOTE — Patient Instructions (Addendum)
History of Covid Loss of taste and smell Memory loss:  Will place referral to Dr. Brett Fairy neurology  Hand out given on memory care tips    Follow up:  Follow up as needed

## 2019-11-06 ENCOUNTER — Other Ambulatory Visit: Payer: Self-pay | Admitting: Family Medicine

## 2019-12-06 ENCOUNTER — Other Ambulatory Visit: Payer: Self-pay | Admitting: Internal Medicine

## 2019-12-15 ENCOUNTER — Encounter: Payer: Self-pay | Admitting: Internal Medicine

## 2020-01-21 ENCOUNTER — Ambulatory Visit: Payer: 59 | Admitting: Neurology

## 2020-01-21 ENCOUNTER — Encounter: Payer: Self-pay | Admitting: Neurology

## 2020-01-21 VITALS — BP 142/86 | HR 71 | Ht 64.5 in | Wt 178.0 lb

## 2020-01-21 DIAGNOSIS — R430 Anosmia: Secondary | ICD-10-CM

## 2020-01-21 DIAGNOSIS — R431 Parosmia: Secondary | ICD-10-CM

## 2020-01-21 DIAGNOSIS — R432 Parageusia: Secondary | ICD-10-CM | POA: Diagnosis not present

## 2020-01-21 DIAGNOSIS — Z8616 Personal history of COVID-19: Secondary | ICD-10-CM | POA: Diagnosis not present

## 2020-01-21 DIAGNOSIS — D690 Allergic purpura: Secondary | ICD-10-CM | POA: Insufficient documentation

## 2020-01-21 DIAGNOSIS — U071 COVID-19: Secondary | ICD-10-CM

## 2020-01-21 MED ORDER — CARBAMAZEPINE ER 100 MG PO TB12: ORAL_TABLET | ORAL | 5 refills | Status: DC

## 2020-01-21 NOTE — Progress Notes (Signed)
Provider:  Larey Seat, M D  Referring Provider: Binnie Rail, MD Primary Care Physician:  Binnie Rail, MD  Chief Complaint  Patient presents with  . New Patient (Initial Visit)    pt alone, rm 10. presents today with residual side effects post covid from july 2020. initially loss taste and smell at time she was positive in 08/2018. when it came back it was distorted. after the 2nd vaccine that seemed to amplify the distortion. these taste/smell can cause her to become nauseous. recevied the booster and symptoms lessened slightyly.   . Other    she can't have coffee, garlic,onion,beef, chicken,pork, egg, most veggies. they all have same taste and smell. she states can hold coffee in one hand and onion in other and would not be able to tell difference.    HPI:  Whitney Blake is a 59 y.o. female and seen here on 01-21-2020 upon referral  from the [facility].   Whitney Blake reported that she contracted Covid in early July 2020 long before there was any vaccination available.  Her husband also contracted the disease, she reports 3 weeks of fever, myalgia ,feeling weak, barely able to breathe.  She remembers that her husband went to the bathroom returned back to bed and was "" staring through her.  She took his oxygen saturation by pulse oximetry and it was 7%9 and that day they decided to go to the emergency room.  He had contracted bilateral Covid pneumonia and ended [facility] in the [facility], for a duration of 6 days. He had ongoing shortness of breath since. His heart rhythms were abnormal.  She never had the pneumonia.   For Whitney Blake I growing frustration has been that she cannot differentiate the scents or smells of several important kitchen ingredients.  She used to love coffee and now cannot just not tasted but it tastes bad.  Onion garlic have the same smell is coffee for her and it is almost like a smell of a waste product  or something that is just not meant for consumption.  All meat smell the same and many veggies have bad taste or bad smell - cucumber moldy , rotten.  There is also a safety issue she would not be able to distinguish if a food is truly walking, she also said that even smell of excrement is no longer identifiable as such and exhaust pipe smoke has the same smell as coffee !!  She got vaccinated - and the second dose amplified everything- that was April 2021, the Booster however brought things back to more normal level. Each shot left her in chills, and myalgia- for a couple of days.    Review of Systems: Out of a complete 14 system review, the patient complains of only the following symptoms, and all other reviewed systems are negative.   Anosmia, ageusia, parosmia.  18 month.    Social History   Socioeconomic History  . Marital status: Married    Spouse name: Not on file  . Number of children: 2  . Years of education: Not on file  . Highest education level: Not on file  Occupational History  . Occupation: Chemical engineer: [employer]  Tobacco Use  . Smoking status: Never Smoker  . Smokeless tobacco: Never Used  Substance and Sexual Activity  . Alcohol use: No  . Drug use: No  . Sexual activity:  Not on file  Other Topics Concern  . Not on file  Social History Narrative   Exercises 3 a week    Daily caffeine    Social Determinants of Health   Financial Resource Strain:   . Difficulty of Paying Living Expenses: Not on file  Food Insecurity:   . Worried About Charity fundraiser in the Last Year: Not on file  . Ran Out of Food in the Last Year: Not on file  Transportation Needs:   . Lack of Transportation (Medical): Not on file  . Lack of Transportation (Non-Medical): Not on file  Physical Activity:   . Days of Exercise per Week: Not on file  . Minutes of Exercise per Session: Not on file  Stress:   . Feeling of Stress : Not on file  Social Connections:    . Frequency of Communication with Friends and Family: Not on file  . Frequency of Social Gatherings with Friends and Family: Not on file  . Attends Religious Services: Not on file  . Active Member of Clubs or Organizations: Not on file  . Attends Archivist Meetings: Not on file  . Marital Status: Not on file  Intimate Partner Violence:   . Fear of Current or Ex-Partner: Not on file  . Emotionally Abused: Not on file  . Physically Abused: Not on file  . Sexually Abused: Not on file    Family History  Problem Relation Age of Onset  . Hypertension Mother        bilateral endarterectomies,pacer,mesenteric artery bypass, renal artery stenosis stented unilaterally, intestinal perforation, steroids for colitis induced osteoprosis, polyps   . Cancer Mother        uterine S/P Tamoxifen  . Transient ischemic attack Mother        in early 16's  . Colon polyps Mother   . Irritable bowel syndrome Mother   . Breast cancer Mother   . Heart disease Mother        pacer  . Asthma Father   . Hypertension Father   . Colon polyps Father   . Heart disease Father        S/P ablation for AF  . Stroke Father        cns bleed on coumadin  . Hypertension Sister        2 sisters  . Cancer Maternal Grandmother        uterine  . Diabetes Maternal Grandmother   . Cancer Sister        cervical  . Colon cancer Neg Hx     Past Medical History:  Diagnosis Date  . Anemia    PMH of  . Cervical spinal stenosis    Dr Arnoldo Morale, [place]  . Hypertension   . Internal hemorrhoids   . Migraines    menstrual  . Nonspecific ST-T changes 2007  . Normal nuclear stress test 2007    Past Surgical History:  Procedure Laterality Date  . COLONOSCOPY  2012   neg; [facility]  . CONCUSSION     DUE TO FALL AT AGE 89  . FOOT SURGERY     Right   . LUMBAR LAMINECTOMY  2003   FOR RUPTURED DISC  . ORIF CLAVICULAR FRACTURE     bilaterally @ age 64  . TONSILLECTOMY AND ADENOIDECTOMY      Current  Outpatient Medications  Medication Sig Dispense Refill  . cholecalciferol (VITAMIN D) 1000 units tablet Take 2,000 Units by mouth daily.    [name]  Multiple Vitamin (MULTIVITAMIN WITH MINERALS) TABS tablet Take 1 tablet by mouth daily.    [name] telmisartan-hydrochlorothiazide (MICARDIS HCT) 40-12.5 MG tablet TAKE 1 TABLET BY MOUTH DAILY 30 tablet 5  . Zinc 50 MG CAPS Take by mouth.    [name] aspirin EC 81 MG tablet Take 81 mg by mouth daily. (Patient not taking: Reported on 01/21/2020)    . gabapentin (NEURONTIN) 100 MG capsule TAKE 2 CAPSULES BY MOUTH AT BEDTIME (Patient not taking: Reported on 01/21/2020) 60 capsule 3   No current facility-administered medications for this visit.    Allergies as of 01/21/2020 - Review Complete 01/21/2020  Allergen Reaction Noted  . Rofecoxib Swelling   . Coreg [carvedilol] Other (See Comments) 03/18/2013    Vitals: BP (!) 142/86   Pulse 71   Ht 5' 4.5" (1.638 m)   Wt 178 lb (80.7 kg)   LMP 02/14/2012   BMI 30.08 kg/m  Last Weight:  Wt Readings from Last 1 Encounters:  01/21/20 178 lb (80.7 kg)   Last Height:   Ht Readings from Last 1 Encounters:  01/21/20 5' 4.5" (1.638 m)    Physical exam:  General: The patient is awake, alert and appears not in acute distress. The patient is well groomed. Head: Normocephalic, atraumatic. Neck is supple.  Cardiovascular:  Regular rate and rhythm , without  murmurs or carotid bruit, and without distended neck veins. Respiratory: Lungs are clear to auscultation. Skin:  Without evidence of edema, or rash Trunk: BMI is 30 - normal posture.  Neurologic exam : The patient is awake and alert, oriented to place and time.  Memory subjective described as intact.  There is an impaired  attention span & concentration ability, she feels subjectively . Speech is fluent without  dysarthria, dysphonia or aphasia. Mood and affect are anxious, a little sad.   Cranial nerves:  Orange scent identiffied as citrus, general, taste  identified as citrus.  Peppermint smell is identified, can smell and taste it.  Vanilla scent identified correctly , taste Ok Coconut identified, taste identified.  Rhum - scent not identified- nor taste  Coffee- horrible smell- no taste.  Pupils are equal and briskly reactive to light. Funduscopic exam without evidence of pallor or edema. Extraocular movements  in vertical and horizontal planes intact and without nystagmus.   She has hearing loss- tuning fork test.  Visual fields by finger perimetry are intact  Facial sensation intact to fine touch. Facial motor strength is symmetric and tongue and uvula move midline.  Tongue protrusion into either cheek is normal. Shoulder shrug is normal.   Motor exam:  Normal muscle bulk and symmetric strength in all extremities. Right arm and shoulder reportedly tense and painful.  Elevated biceps tone on the right , dominant side.  Sensory:  Fine touch, pinprick and vibration were tested in all extremities. Proprioception was normal. Coordination: Rapid alternating movements in the fingers/hands were normal. Finger-to-nose maneuver  normal without evidence of ataxia, dysmetria or tremor.  Gait and station: Patient walks without assistive device  Deep tendon reflexes: in the upper and lower extremities are symmetric and intact.   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  Plan:  Treatment plan and additional workup :   Tegretol 100 mg ,bid for parosmia  Consider Theophylline nasal spray once we have a compounding pharmacy contact.   Treatment  of ANOSMIA and Ageusia: If the combination of smell and vision of the same object that you're smelling improves the recovery of smell, then just smell alone. So we call that bimodal, visual and olfactory stimulation.  So the standard olfactory training is four scents: rose, lemon, cloves and eucalyptus.  And the subject sniffs twice a day, 10, 12, 20 seconds each time the four essential oils for anywhere to six, eight or 12 weeks.  And olfactory training for anosmia has been around for maybe 10, 15 years, studies suggest it works.  There's nothing absolutely definitive- but it can't hurt. It's thought to work by retraining the brain, the process of neuroplasticity, retraining the brain to smell these odors again. And we think that by smelling, you can retrain the brain. Now, one of the outstanding questions is, are you retraining the brain to smell rose, lemon, cloves and eucalyptus better, but forget about coffee, vanilla, coconut?  How generalizable is the improvement in smell? Clair Gulling, we asked the patients what smells would you like to pick and train on? The answer was fascinating to [name]. I thought it might be coffee as a coffee lover. I thought it might be vanilla as a vanilla-bean ice cream lover. It was smoke. The number one odor, smell, if you will, that people wanted to train on and to get better was smoke for the reasons that we talked about before.  The sense of smell is first and foremost attached to safety. And these people felt that if they could smell smoke better, they would feel more safe.  Unfortunately, there isn't a smoke essential oil so we couldn't do that.  But it just reinforced to [name] how important it is for patients to pick what smells they want to train on.  The other unique aspect that should be brought up is that half of the group are looking at high-quality pictures of the item, one of four items they picked, and each one of them have high-quality photographs presented on their iPhone or iPad at the same time that they smell.      Asencion Partridge Nikia Mangino MD 01/21/2020

## 2020-01-21 NOTE — Patient Instructions (Signed)
Treatment of ANOSMIA and Ageusia: If the combination of smell and vision of the same object that you're smelling improves the recovery of smell, then just smell alone. So we call that bimodal, visual and olfactory stimulation. So the standard olfactory training is four scents: rose, lemon, cloves and eucalyptus.  And the subject sniffs twice a day, 10, 12, 20 seconds each time the four essential oils for anywhere to six, eight or 12 weeks.  And olfactory training for anosmia has been around for maybe 10, 15 years, studies suggest it works.  There's nothing absolutely definitive- but it can't hurt. It's thought to work by retraining the brain, the process of neuroplasticity, retraining the brain to smell these odors again. And we think that by smelling, you can retrain the brain. Now, one of the outstanding questions is, are you retraining the brain to smell rose, lemon, cloves and eucalyptus better, but forget about coffee, vanilla, coconut?  How generalizable is the improvement in smell? Whitney Blake, we asked the patients what smells would you like to pick and train on? The answer was fascinating to [name]. I thought it might be coffee as a coffee lover. I thought it might be vanilla as a vanilla-bean ice cream lover. It was smoke. The number one odor, smell, if you will, that people wanted to train on and to get better was smoke for the reasons that we talked about before.  The sense of smell is first and foremost attached to safety. And these people felt that if they could smell smoke better, they would feel more safe.  Unfortunately, there isn't a smoke essential oil so we couldn't do that.  But it just reinforced to [name] how important it is for patients to pick what smells they want to train on.  The other unique aspect that should be brought up is that half of the group are looking at high-quality pictures of the item, one of four items they picked, and each one of them have high-quality photographs presented on  their iPhone or iPad at the same time that they smell. Loss of smell, also known as ANOSMIA, is one of the most common symptoms of COVID-19.   The [facility] Professor of Otolaryngology Whitney Shepherd, MD, and Assistant Professor Whitney Jersey, MD, have investigated several treatments for persistent anosmia (loss of smell), with a special interest in viral-related anosmia.     As the number of total, confirmed COVID-19 cases increases so does the number of people suffering from disease-related anosmia, making anosmia a significant public health problem.     Rehabilitation of anosmia :   Smell daily one sample of the following categories; and look at a picture of the object - f. Example at a picture of a lemon while smelling a lemon oil.   A floral scent  - such as rose, lavender, or vanilla - all would qualify for these.  A spice scent - nutmeg, anise, coffee, cinnamon-  A citrus smell - lemon, lime, orange  A menthol or minty scent, or eucalyptus.   Goal is to re-associate scent ( and eventually taste ) to the image.  The success is gradual, and this form of rehabilitation may take 12 month to succeed.   Another complication is PAROSMIA - smelling something that is not present, often an unpleasant smell of burning rubber or spoiled , foul smells.   This can be reduced by using a carbamazepine at 100 mg po bid. This antiepileptic medication slows the nerve conduction and  allows the reduction in abnormal smell sensation.    Whitney Seat, MD    Carbamazepine tablets  This is used for parosmia and for olfactory hallucinations.  What is this medicine? CARBAMAZEPINE (kar ba MAZ e peen) is used to control seizures caused by certain types of epilepsy. This medicine is also used to treat nerve related pain. It is not for common aches and pains. This medicine may be used for other purposes; ask your health care provider or pharmacist if you have questions. COMMON BRAND  NAME(S): Epitol, Tegretol What should I tell my health care provider before I take this medicine? They need to know if you have any of these conditions:  Asian ancestry  bone marrow disease  glaucoma  heart disease or irregular heartbeat  kidney disease  liver disease  low blood counts, like low white cell, platelet, or red cell counts  porphyria  psychotic disorders  suicidal thoughts, plans, or attempt; a previous suicide attempt by you or a family member  an unusual or allergic reaction to carbamazepine, tricyclic antidepressants, phenytoin, phenobarbital or other medicines, foods, dyes, or preservatives  pregnant or trying to get pregnant  breast-feeding How should I use this medicine? Take this medicine by mouth with a glass of water. Follow the directions on the prescription label. Take this medicine with food. Take your doses at regular intervals. Do not take your medicine more often than directed. Do not stop taking this medicine except on the advice of your doctor or health care professional. A special MedGuide will be given to you by the pharmacist with each prescription and refill. Be sure to read this information carefully each time. Talk to your pediatrician regarding the use of this medicine in children. Special care may be needed. Overdosage: If you think you have taken too much of this medicine contact a poison control center or emergency room at once. NOTE: This medicine is only for you. Do not share this medicine with others. What if I miss a dose? If you miss a dose, take it as soon as you can. If it is almost time for your next dose, take only that dose. Do not take double or extra doses. What may interact with this medicine? Do not take this medicine with any of the following medications:  certain medicines used to treat HIV infection or AIDS that are given in combination with cobicistat   delavirdine   MAOIs like Carbex, Eldepryl, Marplan, Nardil,  and Parnate   nefazodone   oxcarbazepine This medicine may also interact with the following medications:   acetaminophen   acetazolamide   barbiturate medicines for inducing sleep or treating seizures, like phenobarbital   certain antibiotics like clarithromycin, erythromycin or troleandomycin   cimetidine   cyclosporine   danazol   dicumarol   doxycycline   female hormones, including estrogens and birth control pills   grapefruit juice   isoniazid, INH   levothyroxine and other thyroid hormones   lithium and other medicines to treat mood problems or psychotic disturbances   loratadine   medicines for angina or high blood pressure   medicines for cancer   medicines for depression or anxiety   medicines for sleep   medicines to treat fungal infections, like fluconazole, itraconazole or ketoconazole   medicines used to treat HIV infection or AIDS   methadone   niacinamide   praziquantel   propoxyphene   rifampin or rifabutin   seizure or epilepsy medicine   steroid medicines such as prednisone  or cortisone   theophylline   tramadol   warfarin This list may not describe all possible interactions. Give your health care provider a list of all the medicines, herbs, non-prescription drugs, or dietary supplements you use. Also tell them if you smoke, drink alcohol, or use illegal drugs. Some items may interact with your medicine. What should I watch for while using this medicine? Visit your doctor or health care provider for a regular check on your progress. Do not change brands or dosage forms of this medicine without discussing the change with your doctor or health care provider. If you are taking this medicine for epilepsy (seizures), do not stop taking it suddenly. This increases the risk of seizures. Wear a Probation officer or necklace. Carry an identification card with information about your condition,  medications, and doctor or health care provider. This medicine may cause serious skin reactions. They can happen weeks to months after starting the medicine. Contact your health care provider right away if you notice fevers or flu-like symptoms with a rash. The rash may be red or purple and then turn into blisters or peeling of the skin. Or, you might notice a red rash with swelling of the face, lips or lymph nodes in your neck or under your arms. You may get drowsy, dizzy, or have blurred vision. Do not drive, use machinery, or do anything that needs mental alertness until you know how this medicine affects you. To reduce dizzy or fainting spells, do not sit or stand up quickly, especially if you are an older patient. Alcohol can increase drowsiness and dizziness. Avoid alcoholic drinks. Birth control pills may not work properly while you are taking this medicine. Talk to your doctor about using an extra method of birth control. This medicine can make you more sensitive to the sun. Keep out of the sun. If you cannot avoid being in the sun, wear protective clothing and use sunscreen. Do not use sun lamps or tanning beds/booths. The use of this medicine may increase the chance of suicidal thoughts or actions. Pay special attention to how you are responding while on this medicine. Any worsening of mood, or thoughts of suicide or dying should be reported to your health care provider right away. Women who become pregnant while using this medicine may enroll in the [place] Pregnancy Registry by calling [phone]. This registry collects information about the safety of antiepileptic drug use during pregnancy. This medicine may cause a decrease in vitamin D and folic acid. You should make sure that you get enough vitamins while you are taking this medicine. Discuss the foods you eat and the vitamins you take with your health care provider. What side effects may I notice from receiving  this medicine? Side effects that you should report to your doctor or health care professional as soon as possible:  allergic reactions like skin rash, itching or hives, swelling of the face, lips, or tongue  breathing problems  changes in vision  confusion  dark urine  fast or irregular heartbeat  fever or chills, sore throat  mouth ulcers  pain or difficulty passing urine  rash, fever, and swollen lymph nodes  redness, blistering, peeling or loosening of the skin, including inside the mouth  ringing in the ears  seizures  stomach pain  swollen joints or muscle/joint aches and pains  unusual bleeding or bruising  unusually weak or tired  vomiting  worsening of mood, thoughts or actions of suicide or dying  yellowing of the eyes or skin Side effects that usually do not require medical attention (report to your doctor or health care professional if they continue or are bothersome):  clumsiness or unsteadiness  diarrhea or constipation  headache  increased sweating  nausea This list may not describe all possible side effects. Call your doctor for medical advice about side effects. You may report side effects to FDA at 1-800-FDA-1088. Where should I keep my medicine? Keep out of reach of children. Store at room temperature below 30 degrees C (86 degrees F). Keep container tightly closed. Protect from moisture. Throw away any unused medicine after the expiration date. NOTE: This sheet is a summary. It may not cover all possible information. If you have questions about this medicine, talk to your doctor, pharmacist, or health care provider.  2020 Elsevier/Gold Standard (2018-05-02 09:05:49)

## 2020-01-22 ENCOUNTER — Telehealth: Payer: Self-pay | Admitting: Neurology

## 2020-01-22 LAB — CBC WITH DIFFERENTIAL/PLATELET
Basophils Absolute: 0.1 10*3/uL (ref 0.0–0.2)
Basos: 1 %
EOS (ABSOLUTE): 0.2 10*3/uL (ref 0.0–0.4)
Eos: 2 %
Hematocrit: 40.7 % (ref 34.0–46.6)
Hemoglobin: 13.7 g/dL (ref 11.1–15.9)
Immature Grans (Abs): 0 10*3/uL (ref 0.0–0.1)
Immature Granulocytes: 0 %
Lymphocytes Absolute: 3.1 10*3/uL (ref 0.7–3.1)
Lymphs: 33 %
MCH: 29.3 pg (ref 26.6–33.0)
MCHC: 33.7 g/dL (ref 31.5–35.7)
MCV: 87 fL (ref 79–97)
Monocytes Absolute: 0.5 10*3/uL (ref 0.1–0.9)
Monocytes: 6 %
Neutrophils Absolute: 5.5 10*3/uL (ref 1.4–7.0)
Neutrophils: 58 %
Platelets: 263 10*3/uL (ref 150–450)
RBC: 4.68 x10E6/uL (ref 3.77–5.28)
RDW: 13.1 % (ref 11.7–15.4)
WBC: 9.4 10*3/uL (ref 3.4–10.8)

## 2020-01-22 LAB — COMPREHENSIVE METABOLIC PANEL
ALT: 27 IU/L (ref 0–32)
AST: 23 IU/L (ref 0–40)
Albumin/Globulin Ratio: 1.9 (ref 1.2–2.2)
Albumin: 4.3 g/dL (ref 3.8–4.9)
Alkaline Phosphatase: 75 IU/L (ref 44–121)
BUN/Creatinine Ratio: 13 (ref 12–28)
BUN: 11 mg/dL (ref 8–27)
Bilirubin Total: 0.2 mg/dL (ref 0.0–1.2)
CO2: 25 mmol/L (ref 20–29)
Calcium: 9.3 mg/dL (ref 8.7–10.3)
Chloride: 101 mmol/L (ref 96–106)
Creatinine, Ser: 0.88 mg/dL (ref 0.57–1.00)
GFR calc Af Amer: 83 mL/min/{1.73_m2} (ref >59)
GFR calc non Af Amer: 72 mL/min/{1.73_m2} (ref >59)
Globulin, Total: 2.3 g/dL (ref 1.5–4.5)
Glucose: 90 mg/dL (ref 65–99)
Potassium: 4.1 mmol/L (ref 3.5–5.2)
Sodium: 143 mmol/L (ref 134–144)
Total Protein: 6.6 g/dL (ref 6.0–8.5)

## 2020-01-22 NOTE — Progress Notes (Signed)
Normal metabolic a panel and CBC - can start on tegretol 100 mg once a day and increase , if tolerated- to bid po after 8 days.

## 2020-01-22 NOTE — Telephone Encounter (Signed)
Called the patient and reviewed the lab results with her. Advised of the medication titration. Pt verbalized understanding. Pt had no questions at this time but was encouraged to call back if questions arise.

## 2020-01-22 NOTE — Telephone Encounter (Signed)
-----   Message from Larey Seat, MD sent at 01/22/2020  3:51 PM EST ----- Normal metabolic a panel and CBC - can start on tegretol 100 mg once a day and increase , if tolerated- to bid po after 8 days.

## 2020-02-03 ENCOUNTER — Ambulatory Visit: Payer: 59

## 2020-02-04 ENCOUNTER — Ambulatory Visit: Payer: 59

## 2020-02-24 ENCOUNTER — Other Ambulatory Visit: Payer: Self-pay | Admitting: Obstetrics and Gynecology

## 2020-02-24 DIAGNOSIS — Z1231 Encounter for screening mammogram for malignant neoplasm of breast: Secondary | ICD-10-CM

## 2020-02-27 ENCOUNTER — Ambulatory Visit
Admission: RE | Admit: 2020-02-27 | Discharge: 2020-02-27 | Disposition: A | Discharge status: Home / Self Care | Payer: 59 | Source: Ambulatory Visit

## 2020-02-27 ENCOUNTER — Other Ambulatory Visit: Payer: Self-pay

## 2020-02-27 DIAGNOSIS — Z1231 Encounter for screening mammogram for malignant neoplasm of breast: Secondary | ICD-10-CM

## 2020-02-28 ENCOUNTER — Encounter: Payer: Self-pay | Admitting: Neurology

## 2020-03-08 ENCOUNTER — Ambulatory Visit: Payer: 59

## 2020-04-20 ENCOUNTER — Encounter: Payer: Self-pay | Admitting: Neurology

## 2020-04-20 ENCOUNTER — Telehealth (INDEPENDENT_AMBULATORY_CARE_PROVIDER_SITE_OTHER): Payer: 59 | Admitting: Neurology

## 2020-04-20 DIAGNOSIS — U071 COVID-19: Secondary | ICD-10-CM

## 2020-04-20 DIAGNOSIS — R439 Unspecified disturbances of smell and taste: Secondary | ICD-10-CM | POA: Diagnosis not present

## 2020-04-20 NOTE — Progress Notes (Signed)
Virtual Visit via Video Note  I connected with Whitney Blake on 04/20/20 at  1:30 PM EST by a video enabled telemedicine application and verified that I am speaking with the correct person using two identifiers.  Referral by [facility].   Location: Patient: at home  Provider: at [facility]   I discussed the limitations of evaluation and management by telemedicine and the availability of in person appointments. The patient expressed understanding and agreed to proceed.  History of Present Illness: post COVID - anosmia and ageusia.      Observations/Objective: patient has reported vivid dreams and deeper sleep since taking medication. She started a new exercise program. No shortness of breath.   Onions , garlic , coffee smell bad. Egg white smells bad.chicken and all meat tastes bad. Pickles and vinegar taste fine, as does mustard.  Cheese tastes good.  Seafood, salmon taste perfectly fine.  Mashed potatoes are tasting good.   Assessment and Plan: Continue smell school if she likes - take one tab tegretol 100 mg once a day for the rest of the current prescription.    Follow Up Instructions: PRN.  No major improvement in ageusia, anosmia, not likely to improve.  Building up exercise tolerance , hydration.    I discussed the assessment and treatment plan with the patient. The patient was provided an opportunity to ask questions and all were answered. The patient agreed with the plan and demonstrated an understanding of the instructions.   The patient was advised to call back or seek an in-person evaluation if the symptoms worsen or if the condition fails to improve as anticipated.  I provided 15 minutes of non-face-to-face time during this encounter.   Larey Seat, MD

## 2020-05-26 ENCOUNTER — Encounter: Payer: Self-pay | Admitting: Internal Medicine

## 2020-05-26 ENCOUNTER — Ambulatory Visit: Payer: 59 | Admitting: Internal Medicine

## 2020-05-26 ENCOUNTER — Other Ambulatory Visit: Payer: Self-pay

## 2020-05-26 VITALS — BP 140/80 | HR 70 | Temp 98.4°F | Ht 64.5 in | Wt 179.0 lb

## 2020-05-26 DIAGNOSIS — M25511 Pain in right shoulder: Secondary | ICD-10-CM

## 2020-05-26 DIAGNOSIS — M79645 Pain in left finger(s): Secondary | ICD-10-CM | POA: Diagnosis not present

## 2020-05-26 DIAGNOSIS — G8929 Other chronic pain: Secondary | ICD-10-CM | POA: Diagnosis not present

## 2020-05-26 NOTE — Progress Notes (Signed)
Subjective:    Patient ID: Whitney Blake, female    DOB: 1960-10-03, 60 y.o.   MRN: [id number]  HPI The patient is here for an acute visit for pain in shoulder and thumb.   Right shoulder pain - started months ago, maybe one year ago.  Initially it would ache when it was hanging down.  Now she has a focal point of tenderness and the pain has increased.  The pain is more on the anterior aspect and feels deep inside.  Exercise does not make it worse.    ROM is good.  NO N/T or weakness.  No radiation of pain.    Left thumb pain - pain with opening things.  Pain shoots through it.  No swelling.  No other finger joint pain.    Medications and allergies reviewed with patient and updated if appropriate.  Patient Active Problem List   Diagnosis Date Noted  . 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 Ageusia 01/21/2020  . 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 History of COVID-19 11/05/2019  . Memory loss 11/05/2019  . Smell or taste sensation disturbance 07/03/2019  . Bursitis of right foot 05/01/2019  . Interdigital neuroma of right foot 02/25/2019  . COVID-19 virus infection 09/14/2018  . Leg cramping 07/02/2018  . Unspecified dyspareunia (CODE) 12/11/2017  . Lichen sclerosus of female genitalia 05/04/2017  . Vitamin D deficiency 05/03/2017  . Lumbar radiculopathy 05/15/2016  . Prediabetes 04/29/2016  . Leg pain, bilateral 04/23/2015  . Nephrolithiasis 09/06/2012  . Essential hypertension 12/07/2011  . Migraine 12/21/2008  . Anemia 09/03/2007  . NONSPECIFIC ABNORMAL ELECTROCARDIOGRAM 09/03/2007  . SPINAL STENOSIS 06/14/2006    Current Outpatient Medications on File Prior to Visit  Medication Sig Dispense Refill  . cholecalciferol (VITAMIN D) 1000 units tablet Take 2,000 Units by mouth daily.    . Multiple Vitamin (MULTIVITAMIN WITH MINERALS) TABS tablet Take 1 tablet by mouth daily.    [name] telmisartan-hydrochlorothiazide (MICARDIS HCT) 40-12.5 MG tablet  TAKE 1 TABLET BY MOUTH DAILY 30 tablet 5  . clobetasol ointment (TEMOVATE) 0.05 % SMARTSIG:Sparingly Topical Twice Daily PRN    . fluticasone (FLONASE) 50 MCG/ACT nasal spray fluticasone propionate 50 mcg/actuation nasal spray,suspension     No current facility-administered medications on file prior to visit.    Past Medical History:  Diagnosis Date  . Anemia    PMH of  . Cervical spinal stenosis    Dr Arnoldo Morale, [place]  . Hypertension   . Internal hemorrhoids   . Migraines    menstrual  . Nonspecific ST-T changes 2007  . Normal nuclear stress test 2007    Past Surgical History:  Procedure Laterality Date  . COLONOSCOPY  2012   neg; [facility]  . CONCUSSION     DUE TO FALL AT AGE 2  . FOOT SURGERY     Right   . LUMBAR LAMINECTOMY  2003   FOR RUPTURED DISC  . ORIF CLAVICULAR FRACTURE     bilaterally @ age 84  . TONSILLECTOMY AND ADENOIDECTOMY      Social History   Socioeconomic History  . Marital status: Married    Spouse name: Not on file  . Number of children: 2  . Years of education: Not on file  . Highest education level: Not on file  Occupational History  . Occupation: Chemical engineer: [employer]  Tobacco Use  . Smoking status: Never Smoker  . Smokeless tobacco: Never Used  Substance and Sexual Activity  . Alcohol use: No  . Drug use: No  . Sexual activity: Not on file  Other Topics Concern  . Not on file  Social History Narrative   Exercises 3 a week    Daily caffeine    Social Determinants of Health   Financial Resource Strain: Not on file  Food Insecurity: Not on file  Transportation Needs: Not on file  Physical Activity: Not on file  Stress: Not on file  Social Connections: Not on file    Family History  Problem Relation Age of Onset  . Hypertension Mother        bilateral endarterectomies,pacer,mesenteric artery bypass, renal artery stenosis stented unilaterally, intestinal perforation, steroids for colitis induced  osteoprosis, polyps   . Cancer Mother        uterine S/P Tamoxifen  . Transient ischemic attack Mother        in early 35's  . Colon polyps Mother   . Irritable bowel syndrome Mother   . Breast cancer Mother   . Heart disease Mother        pacer  . Asthma Father   . Hypertension Father   . Colon polyps Father   . Heart disease Father        S/P ablation for AF  . Stroke Father        cns bleed on coumadin  . Hypertension Sister        2 sisters  . Cancer Maternal Grandmother        uterine  . Diabetes Maternal Grandmother   . Cancer Sister        cervical  . Colon cancer Neg Hx     Review of Systems     Objective:   Vitals:   05/26/20 1323  BP: 140/80  Pulse: 70  Temp: 98.4 F (36.9 C)  SpO2: 97%   BP Readings from Last 3 Encounters:  05/26/20 140/80  01/21/20 (!) 142/86  11/05/19 130/82   Wt Readings from Last 3 Encounters:  05/26/20 179 lb (81.2 kg)  01/21/20 178 lb (80.7 kg)  11/05/19 185 lb (83.9 kg)   Body mass index is 30.25 kg/m.   Physical Exam Constitutional:      General: She is not in acute distress.    Appearance: Normal appearance. She is not ill-appearing.  HENT:     Head: Normocephalic and atraumatic.  Musculoskeletal:     Comments: Right shoulder w/o swelling, effusion, tenderness anterior-top of shoulder joint, good ROM, no weakness.  Left thumb mcp joint tender - no swelling / deformity  Skin:    General: Skin is warm and dry.     Findings: No erythema.  Neurological:     Mental Status: She is alert.            Assessment & Plan:    See Problem List for Assessment and Plan of chronic medical problems.    This visit occurred during the SARS-CoV-2 public health emergency.  Safety protocols were in place, including screening questions prior to the visit, additional usage of staff PPE, and extensive cleaning of exam room while observing appropriate contact time as indicated for disinfecting solutions.

## 2020-05-26 NOTE — Patient Instructions (Signed)
   A referral was ordered for sports medicine.       Someone from their office will call you to schedule an appointment.

## 2020-05-26 NOTE — Assessment & Plan Note (Signed)
Acute Pain left mcp joint Likely OA Discussed symptomatic treatment, natural anti-inflammatories - tumeric, tart cherry supplements Referred to sports med

## 2020-05-26 NOTE — Assessment & Plan Note (Signed)
Acute Started months ago - has gotten worse - ache when hanging down, now pain in anterior-top of shoulder  Good ROM, no weakness, radiation of pain ? Rotator cuff injury, tendinitis  Will refer to sports med

## 2020-05-31 NOTE — Progress Notes (Signed)
Whitney Blake is a 60 y.o. female who presents to [place] at [facility] today for R shoulder and L thumb.  She was last seen by Dr. Tamala Julian on 07/30/19 for R foot pain.  Since then, pt reports R shoulder and L thumb pain.  R shoulder: Pain x several months to one year that is progressively worsening.  She locates the pain to her anterior shoulder and deep inside the joint. Pt notes shoulder pain started out as an achy pain and then worsened. No neck pain noted. -Radiating pain: no -Mechanical symptoms: yes -Aggravating factors: ABD -Treatments tried: Tylenol, rest  L thumb pain ongoing for a few months. Pt locates pain to [facility] joint. -Swelling: No -Aggravating factors: grasping; opening things -Treatments tried: none   Pertinent review of systems: No fevers or chills  Relevant historical information: Hypertension   Exam:  BP 128/86 (BP Location: Right Arm, Patient Position: Sitting, Cuff Size: Normal)   Pulse (!) 59   Ht 5' 4.5" (1.638 m)   Wt 176 lb 3.2 oz (79.9 kg)   LMP 02/14/2012   SpO2 98%   BMI 29.78 kg/m  General: Well Developed, well nourished, and in no acute distress.   MSK: Right shoulder normal-appearing Not particularly tender to palpation. Normal motion some pain with abduction. Intact strength. Positive Hawkins and Neer's test. Positive empty can test. Negative Yergason's and speeds test.  Left hand normal-appearing mildly tender palpation first CMC.  Normal hand motion and strength.    Lab and Radiology Results  Procedure: Real-time Ultrasound Guided Injection of right shoulder subacromial bursa/subcoracoid bursa Device: Philips Affiniti 50G Images permanently stored and available for review in PACS Ultrasound evaluation reveals intact biceps tendon and rotator cuff tendons.  Moderate subcoracoid bursitis present. Verbal informed consent obtained.  Discussed risks and benefits of procedure. Warned about infection bleeding  damage to structures skin hypopigmentation and fat atrophy among others. Patient expresses understanding and agreement Time-out conducted.   Noted no overlying erythema, induration, or other signs of local infection.   Skin prepped in a sterile fashion.   Local anesthesia: Topical Ethyl chloride.   With sterile technique and under real time ultrasound guidance:  40 mg of Kenalog and 2 mL of Marcaine injected into subacromial/subcoracoid bursa. Fluid seen entering the bursa.   Completed without difficulty   Pain moderately  resolved suggesting accurate placement of the medication.   Advised to call if fevers/chills, erythema, induration, drainage, or persistent bleeding.   Images permanently stored and available for review in the ultrasound unit.  Impression: Technically successful ultrasound guided injection.   Brief look ultrasound left hand reveals moderate joint effusion left first CMC      Assessment and Plan: 60 y.o. female with right shoulder pain due to subcoracoid impingement.  Plan for home exercise program and injection today.  If not improving on her own she will let me know and I will proceed with formal physical therapy referral.  Reassess in about 6 weeks.  Left hand pain thought to be due to first [facility] DJD.  Plan for Voltaren gel and consider injection on recheck if not improved.   PDMP not reviewed this encounter. Orders Placed This Encounter  Procedures  . [name] LIMITED JOINT SPACE STRUCTURES UP RIGHT(NO LINKED CHARGES)    Standing Status:   Future    Number of Occurrences:   1    Standing Expiration Date:   12/01/2020    Order Specific Question:   Reason for  Exam (SYMPTOM  OR DIAGNOSIS REQUIRED)    Answer:   right shoulder pain    Order Specific Question:   Preferred imaging location?    Answer:   [place]   No orders of the defined types were placed in this encounter.    Discussed warning signs or symptoms. Please see discharge  instructions. Patient expresses understanding.   The above documentation has been reviewed and is accurate and complete Lynne Leader, M.D.

## 2020-06-01 ENCOUNTER — Other Ambulatory Visit: Payer: Self-pay

## 2020-06-01 ENCOUNTER — Ambulatory Visit: Payer: 59 | Admitting: Family Medicine

## 2020-06-01 ENCOUNTER — Ambulatory Visit: Payer: Self-pay

## 2020-06-01 VITALS — BP 128/86 | HR 59 | Ht 64.5 in | Wt 176.2 lb

## 2020-06-01 DIAGNOSIS — G8929 Other chronic pain: Secondary | ICD-10-CM | POA: Diagnosis not present

## 2020-06-01 DIAGNOSIS — M25511 Pain in right shoulder: Secondary | ICD-10-CM | POA: Diagnosis not present

## 2020-06-01 DIAGNOSIS — M79645 Pain in left finger(s): Secondary | ICD-10-CM

## 2020-06-01 NOTE — Patient Instructions (Addendum)
Thank you for coming in today.  Call or go to the ER if you develop a large red swollen joint with extreme pain or oozing puss.   Please complete the exercises that the athletic trainer went over with you: View at [email or web address] using code: [id number]  Recheck in 6 weeks.   Please use voltaren gel up to 4x daily for pain as needed.

## 2020-06-22 ENCOUNTER — Other Ambulatory Visit: Payer: Self-pay | Admitting: Internal Medicine

## 2020-06-24 ENCOUNTER — Encounter: Payer: Self-pay | Admitting: Internal Medicine

## 2020-06-24 MED ORDER — ERYTHROMYCIN 5 MG/GM OP OINT: 1.0000 "application " | TOPICAL_OINTMENT | Freq: Every day | OPHTHALMIC | 0 refills | Status: DC

## 2020-07-13 ENCOUNTER — Ambulatory Visit: Payer: 59 | Admitting: Family Medicine

## 2020-08-17 ENCOUNTER — Encounter: Payer: Self-pay | Admitting: Internal Medicine

## 2020-09-12 NOTE — Patient Instructions (Addendum)
Blood work was ordered.     Medications changes include :   meloxicam 15 mg daily with food   Your prescription(s) have been submitted to your pharmacy. Please take as directed and contact our office if you believe you are having problem(s) with the medication(s).    Please followup in 1 year   [facility] Tendinitis Rehab Ask your health care provider which exercises are safe for you. Do exercises exactly as told by your health care provider and adjust them as directed. It is normal to feel mild stretching, pulling, tightness, or discomfort as you do these exercises. Stop right away if you feel sudden pain or your pain gets worse. Do not begin these exercises until told by your health care provider. Stretching and range-of-motion exercises These exercises warm up your muscles and joints and improve the movement andflexibility of your ankle. These exercises also help to relieve pain. Standing wall calf stretch with straight knee  Stand with your hands against a wall. Extend your left / right leg behind you, and bend your front knee slightly. Keep both of your heels on the floor. Point the toes of your back foot slightly inward. Keeping your heels on the floor and your back knee straight, shift your weight toward the wall. Do not allow your back to arch. You should feel a gentle stretch in your upper calf. Hold this position for __________ seconds. Repeat __________ times. Complete this exercise __________ times a day. Standing wall calf stretch with bent knee Stand with your hands against a wall. Extend your left / right leg behind you, and bend your front knee slightly. Keep both of your heels on the floor. Point the toes of your back foot slightly inward. Keeping your heels on the floor, bend your back knee slightly. You should feel a gentle stretch deep in your lower calf near your heel. Hold this position for __________ seconds. Repeat __________ times. Complete this exercise  __________ times a day. Strengthening exercises These exercises build strength and control of your ankle. Endurance is theability to use your muscles for a long time, even after they get tired. Plantar flexion with band In this exercise, you push your toes downward, away from you, with an exercise band providing resistance. Sit on the floor with your left / right leg extended. You may put a pillow under your calf to give your foot more room to move. Loop a rubber exercise band or tube around the ball of your left / right foot. The ball of your foot is on the walking surface, right under your toes. The band or tube should be slightly tense when your foot is relaxed. If the band or tube slips, you can put on your shoe or put a washcloth between the band and your foot to help it stay in place. Slowly point your toes downward, pushing them away from you (plantar flexion). Hold this position for __________ seconds. Slowly release the tension in the band or tube, controlling smoothly until your foot is back to the starting position. Repeat steps 1-5 with your left / right leg. Repeat __________ times. Complete this exercise __________ times a day. Eccentric heel drop  In this exercise, you stand and slowly raise your heel and then slowly lower it. This exercise lengthens the calf muscles (eccentric) while the heel bears weight. If this exercise is too easy, try doing it while wearing a backpack with weights in it. Stand on a step with the balls of your feet. The  ball of your foot is on the walking surface, right under your toes. Do not put your heels on the step. For balance, rest your hands on the wall or on a railing. Rise up onto the balls of your feet. Keeping your heels up, shift all of your weight to your left / right leg and pick up your other leg. Slowly lower your left / right leg so your heel drops below the level of the step. Put down your other foot before returning to the start position.  If told by your health care provider, build up to: 3 sets of 15 repetitions while keeping your knees straight. 3 sets of 15 repetitions while keeping your knees slightly bent as far as told by your health care provider. Repeat __________ times. Complete this exercise __________ times a day. Balance exercises These exercises improve or maintain your balance. Balance is important inpreventing falls. Single leg stand If this exercise is too easy, you can try it with your eyes closed or while standing on a pillow. Without shoes, stand near a railing or in a door frame. Hold on to the railing or door frame as needed. Stand on your left / right foot. Keep your big toe down on the floor and try to keep your arch lifted. Hold this position for __________ seconds. Repeat __________ times. Complete this exercise __________ times a day. This information is not intended to replace advice given to you by your health care provider. Make sure you discuss any questions you have with your healthcare provider. Document Revised: 05/20/2018 Document Reviewed: 11/12/2017 Elsevier Patient Education  2022 [place] Maintenance, Female Adopting a healthy lifestyle and getting preventive care are important in promoting health and wellness. Ask your health care provider about: The right schedule for you to have regular tests and exams. Things you can do on your own to prevent diseases and keep yourself healthy. What should I know about diet, weight, and exercise? Eat a healthy diet  Eat a diet that includes plenty of vegetables, fruits, low-fat dairy products, and lean protein. Do not eat a lot of foods that are high in solid fats, added sugars, or sodium.  Maintain a healthy weight Body mass index (BMI) is used to identify weight problems. It estimates body fat based on height and weight. Your health care provider can help determineyour BMI and help you achieve or maintain a healthy weight. Get  regular exercise Get regular exercise. This is one of the most important things you can do for your health. Most adults should: Exercise for at least 150 minutes each week. The exercise should increase your heart rate and make you sweat (moderate-intensity exercise). Do strengthening exercises at least twice a week. This is in addition to the moderate-intensity exercise. Spend less time sitting. Even light physical activity can be beneficial. Watch cholesterol and blood lipids Have your blood tested for lipids and cholesterol at 61 years of age, then havethis test every 5 years. Have your cholesterol levels checked more often if: Your lipid or cholesterol levels are high. You are older than 61 years of age. You are at high risk for heart disease. What should I know about cancer screening? Depending on your health history and family history, you may need to have cancer screening at various ages. This may include screening for: Breast cancer. Cervical cancer. Colorectal cancer. Skin cancer. Lung cancer. What should I know about heart disease, diabetes, and high blood pressure? Blood pressure and  heart disease High blood pressure causes heart disease and increases the risk of stroke. This is more likely to develop in people who have high blood pressure readings, are of African descent, or are overweight. Have your blood pressure checked: Every 3-5 years if you are 40-76 years of age. Every year if you are 25 years old or older. Diabetes Have regular diabetes screenings. This checks your fasting blood sugar level. Have the screening done: Once every three years after age 46 if you are at a normal weight and have a low risk for diabetes. More often and at a younger age if you are overweight or have a high risk for diabetes. What should I know about preventing infection? Hepatitis B If you have a higher risk for hepatitis B, you should be screened for this virus. Talk with your health care  provider to find out if you are at risk forhepatitis B infection. Hepatitis C Testing is recommended for: Everyone born from [birth year] through 1966. Anyone with known risk factors for hepatitis C. Sexually transmitted infections (STIs) Get screened for STIs, including gonorrhea and chlamydia, if: You are sexually active and are younger than 61 years of age. You are older than 61 years of age and your health care provider tells you that you are at risk for this type of infection. Your sexual activity has changed since you were last screened, and you are at increased risk for chlamydia or gonorrhea. Ask your health care provider if you are at risk. Ask your health care provider about whether you are at high risk for HIV. Your health care provider may recommend a prescription medicine to help prevent HIV infection. If you choose to take medicine to prevent HIV, you should first get tested for HIV. You should then be tested every 3 months for as long as you are taking the medicine. Pregnancy If you are about to stop having your period (premenopausal) and you may become pregnant, seek counseling before you get pregnant. Take 400 to 800 micrograms (mcg) of folic acid every day if you become pregnant. Ask for birth control (contraception) if you want to prevent pregnancy. Osteoporosis and menopause Osteoporosis is a disease in which the bones lose minerals and strength with aging. This can result in bone fractures. If you are 33 years old or older, or if you are at risk for osteoporosis and fractures, ask your health care provider if you should: Be screened for bone loss. Take a calcium or vitamin D supplement to lower your risk of fractures. Be given hormone replacement therapy (HRT) to treat symptoms of menopause. Follow these instructions at home: Lifestyle Do not use any products that contain nicotine or tobacco, such as cigarettes, e-cigarettes, and chewing tobacco. If you need help quitting, ask your  health care provider. Do not use street drugs. Do not share needles. Ask your health care provider for help if you need support or information about quitting drugs. Alcohol use Do not drink alcohol if: Your health care provider tells you not to drink. You are pregnant, may be pregnant, or are planning to become pregnant. If you drink alcohol: Limit how much you use to 0-1 drink a day. Limit intake if you are breastfeeding. Be aware of how much alcohol is in your drink. In the [place] one drink equals one 12 oz bottle of beer (355 mL), one 5 oz glass of wine (148 mL), or one 1 oz glass of hard liquor (44 mL). General instructions Schedule regular health, dental,  and eye exams. Stay current with your vaccines. Tell your health care provider if: You often feel depressed. You have ever been abused or do not feel safe at home. Summary Adopting a healthy lifestyle and getting preventive care are important in promoting health and wellness. Follow your health care provider's instructions about healthy diet, exercising, and getting tested or screened for diseases. Follow your health care provider's instructions on monitoring your cholesterol and blood pressure. This information is not intended to replace advice given to you by your health care provider. Make sure you discuss any questions you have with your healthcare provider. Document Revised: 01/23/2018 Document Reviewed: 01/23/2018 Elsevier Patient Education  2022 [employer].

## 2020-09-12 NOTE — Progress Notes (Signed)
Subjective:    Patient ID: Whitney Blake, female    DOB: 1960/07/13, 60 y.o.   MRN: [id number]   This visit occurred during the SARS-CoV-2 public health emergency.  Safety protocols were in place, including screening questions prior to the visit, additional usage of staff PPE, and extensive cleaning of exam room while observing appropriate contact time as indicated for disinfecting solutions.    HPI She is here for a physical exam.   Having b/l heel pain.  She was having difficulty walking first thing in the morning.  It is gradually getting better.  If she sits for a while and then tries to walk it is very painful.  After walking for a while it gets better.  She was doing Clinical research associate workout.  She has tried ice, advil w/o improvement.    She wakes frequently at night.  Sometimes it is good to go to the bathroom, sometimes just wakes.  Usually gets back to sleep okay.  She goes to the office once a week and she takes tylenol twice that night - feels like she is coming down with the flu - gets hoarse, fatigue, achy and headache.  She has this at home but it is not as bad.    Medications and allergies reviewed with patient and updated if appropriate.  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   History of COVID-19 11/05/2019   Memory loss 11/05/2019   Smell or taste sensation disturbance 07/03/2019   Bursitis of right foot 05/01/2019   Interdigital neuroma of right foot 02/25/2019   Leg cramping 07/02/2018   Unspecified dyspareunia (CODE) 0000000   Lichen sclerosus of female genitalia 05/04/2017   Vitamin D deficiency 05/03/2017   Lumbar radiculopathy 05/15/2016   Prediabetes 04/29/2016   Leg pain, bilateral 04/23/2015   Nephrolithiasis 09/06/2012   Essential hypertension  12/07/2011   Migraine 12/21/2008   Anemia 09/03/2007   NONSPECIFIC ABNORMAL ELECTROCARDIOGRAM 09/03/2007   SPINAL STENOSIS 06/14/2006    Current Outpatient Medications on File Prior to Visit  Medication Sig Dispense Refill   cholecalciferol (VITAMIN D) 1000 units tablet Take 2,000 Units by mouth daily.     clobetasol ointment (TEMOVATE) 0.05 % SMARTSIG:Sparingly Topical Twice Daily PRN     Multiple Vitamin (MULTIVITAMIN WITH MINERALS) TABS tablet Take 1 tablet by mouth daily.     telmisartan-hydrochlorothiazide (MICARDIS HCT) 40-12.5 MG tablet TAKE 1 TABLET BY MOUTH DAILY 30 tablet 5   No current facility-administered medications on file prior to visit.    Past Medical History:  Diagnosis Date   Anemia    PMH of   Cervical spinal stenosis    Dr Arnoldo Morale, [place]   Hypertension    Internal hemorrhoids    Migraines    menstrual   Nonspecific ST-T changes 2007   Normal nuclear stress test 2007    Past Surgical History:  Procedure Laterality Date   COLONOSCOPY  2012   neg; [facility]   CONCUSSION     DUE TO FALL AT AGE 61   FOOT SURGERY     Right    LUMBAR LAMINECTOMY  2003   FOR RUPTURED DISC   ORIF CLAVICULAR FRACTURE     bilaterally @ age 33   TONSILLECTOMY AND ADENOIDECTOMY      Social History  Socioeconomic History   Marital status: Married    Spouse name: Not on file   Number of children: 2   Years of education: Not on file   Highest education level: Not on file  Occupational History   Occupation: Chemical engineer: [employer]  Tobacco Use   Smoking status: Never   Smokeless tobacco: Never  Substance and Sexual Activity   Alcohol use: No   Drug use: No   Sexual activity: Not on file  Other Topics Concern   Not on file  Social History Narrative   Exercises 3 a week    Daily caffeine    Social Determinants of Health   Financial Resource Strain: Not on file  Food Insecurity: Not on file  Transportation Needs: Not on file  Physical  Activity: Not on file  Stress: Not on file  Social Connections: Not on file    Family History  Problem Relation Age of Onset   Hypertension Mother        bilateral endarterectomies,pacer,mesenteric artery bypass, renal artery stenosis stented unilaterally, intestinal perforation, steroids for colitis induced osteoprosis, polyps    Cancer Mother        uterine S/P Tamoxifen   Transient ischemic attack Mother        in early 5's   Colon polyps Mother    Irritable bowel syndrome Mother    Breast cancer Mother    Heart disease Mother        pacer   Asthma Father    Hypertension Father    Colon polyps Father    Heart disease Father        S/P ablation for AF   Stroke Father        cns bleed on coumadin   Hypertension Sister        2 sisters   Cancer Maternal Grandmother        uterine   Diabetes Maternal Grandmother    Cancer Sister        cervical   Colon cancer Neg Hx     Review of Systems  Constitutional:  Negative for fatigue and fever.  Eyes:  Negative for visual disturbance.  Respiratory:  Negative for cough, shortness of breath and wheezing.   Cardiovascular:  Negative for chest pain, palpitations and leg swelling.  Gastrointestinal:  Negative for abdominal pain, blood in stool, constipation, diarrhea and nausea.       No gerd  Genitourinary:  Negative for dysuria.  Musculoskeletal:  Positive for arthralgias (right shoulder, b/l thumbs).  Skin:  Negative for color change and rash.  Neurological:  Negative for light-headedness and headaches.  Psychiatric/Behavioral:  Negative for dysphoric mood. The patient is not nervous/anxious.       Objective:   Vitals:   09/13/20 1309  BP: 140/78  Pulse: 70  Temp: 98.3 F (36.8 C)  SpO2: 97%   Filed Weights   09/13/20 1309  Weight: 172 lb (78 kg)   Body mass index is 29.07 kg/m.  BP Readings from Last 3 Encounters:  09/13/20 140/78  06/01/20 128/86  05/26/20 140/80    Wt Readings from Last 3 Encounters:   09/13/20 172 lb (78 kg)  06/01/20 176 lb 3.2 oz (79.9 kg)  05/26/20 179 lb (81.2 kg)    Depression screen [facility] 2/9 09/13/2020 07/03/2019 07/02/2018  Decreased Interest 1 0 0  Down, Depressed, Hopeless 0 0 0  PHQ - 2 Score 1 0 0  Altered sleeping 1 - -  Tired, decreased energy 1 - -  Change in appetite 1 - -  Feeling bad or failure about yourself  0 - -  Trouble concentrating 2 - -  Moving slowly or fidgety/restless 0 - -  Suicidal thoughts 0 - -  PHQ-9 Score 6 - -  Difficult doing work/chores Somewhat difficult - -    GAD 7 : Generalized Anxiety Score 09/13/2020  Nervous, Anxious, on Edge 0  Control/stop worrying 0  Worry too much - different things 0  Trouble relaxing 0  Restless 0  Easily annoyed or irritable 0  Afraid - awful might happen 0  Total GAD 7 Score 0  Anxiety Difficulty Somewhat difficult        Physical Exam Constitutional: She appears well-developed and well-nourished. No distress.  HENT:  Head: Normocephalic and atraumatic.  Right Ear: External ear normal. Normal ear canal and TM Left Ear: External ear normal.  Normal ear canal and TM Mouth/Throat: Oropharynx is clear and moist.  Eyes: Conjunctivae and EOM are normal.  Neck: Neck supple. No tracheal deviation present. No thyromegaly present.  No carotid bruit  Cardiovascular: Normal rate, regular rhythm and normal heart sounds.   No murmur heard.  No edema. Pulmonary/Chest: Effort normal and breath sounds normal. No respiratory distress. She has no wheezes. She has no rales.  Breast: deferred   Abdominal: Soft. She exhibits no distension. There is no tenderness. Musculoskeletal: Tenderness left distal Achilles with slight swelling, no tenderness or swelling right Achilles.  Full range of motion bilateral ankles.  No heel tenderness bilaterally.  No calf tenderness bilaterally Lymphadenopathy: She has no cervical adenopathy.  Skin: Skin is warm and dry. She is not diaphoretic.  Psychiatric: She has a  normal mood and affect. Her behavior is normal.     Lab Results  Component Value Date   WBC 9.4 01/21/2020   HGB 13.7 01/21/2020   HCT 40.7 01/21/2020   PLT 263 01/21/2020   GLUCOSE 90 01/21/2020   CHOL 210 (H) 07/03/2019   TRIG 111.0 07/03/2019   HDL 55.60 07/03/2019   LDLCALC 132 (H) 07/03/2019   ALT 27 01/21/2020   AST 23 01/21/2020   NA 143 01/21/2020   K 4.1 01/21/2020   CL 101 01/21/2020   CREATININE 0.88 01/21/2020   BUN 11 01/21/2020   CO2 25 01/21/2020   TSH 2.58 07/03/2019   HGBA1C 5.6 07/03/2019         Assessment & Plan:   Physical exam: Screening blood work  ordered Exercise  regular - beach body Weight  working on weight loss Substance abuse  none   Screened for depression using the PHQ 9 scale.  6, which indicates the possibility of mild depression.  She denies any depression.  We discussed some of her positive answers and some of that is related to her sleep habits.  She denies any concerns with depression and does not feel that any medication is necessary.      Screened for anxiety using GAD7 Scale.  No evidence of anxiety.   Reviewed recommended immunizations.   Health Maintenance  Topic Date Due   Zoster Vaccines- Shingrix (1 of 2) Never done   COVID-19 Vaccine (4 - Booster for Pfizer series) 04/24/2020   DEXA SCAN  05/18/2020   INFLUENZA VACCINE  09/13/2020   COLONOSCOPY (Pts 45-41yr Insurance coverage will need to be confirmed)  02/20/2021   MAMMOGRAM  02/26/2022   PAP SMEAR-Modifier  07/30/2023   TETANUS/TDAP  07/01/2028   Hepatitis  C Screening  Completed   HIV Screening  Completed   Pneumococcal Vaccine 70-18 Years old  Aged Out   HPV VACCINES  Aged Out          See Problem List for Assessment and Plan of chronic medical problems.

## 2020-09-13 ENCOUNTER — Other Ambulatory Visit: Payer: Self-pay

## 2020-09-13 ENCOUNTER — Ambulatory Visit (INDEPENDENT_AMBULATORY_CARE_PROVIDER_SITE_OTHER): Payer: 59 | Admitting: Internal Medicine

## 2020-09-13 ENCOUNTER — Encounter: Payer: Self-pay | Admitting: Internal Medicine

## 2020-09-13 VITALS — BP 140/78 | HR 70 | Temp 98.3°F | Ht 64.5 in | Wt 172.0 lb

## 2020-09-13 DIAGNOSIS — R7303 Prediabetes: Secondary | ICD-10-CM | POA: Diagnosis not present

## 2020-09-13 DIAGNOSIS — M7661 Achilles tendinitis, right leg: Secondary | ICD-10-CM | POA: Diagnosis not present

## 2020-09-13 DIAGNOSIS — M7662 Achilles tendinitis, left leg: Secondary | ICD-10-CM | POA: Diagnosis not present

## 2020-09-13 DIAGNOSIS — Z1389 Encounter for screening for other disorder: Secondary | ICD-10-CM | POA: Diagnosis not present

## 2020-09-13 DIAGNOSIS — E559 Vitamin D deficiency, unspecified: Secondary | ICD-10-CM | POA: Diagnosis not present

## 2020-09-13 DIAGNOSIS — I10 Essential (primary) hypertension: Secondary | ICD-10-CM | POA: Diagnosis not present

## 2020-09-13 DIAGNOSIS — Z0001 Encounter for general adult medical examination with abnormal findings: Secondary | ICD-10-CM | POA: Diagnosis not present

## 2020-09-13 DIAGNOSIS — Z0000 Encounter for general adult medical examination without abnormal findings: Secondary | ICD-10-CM

## 2020-09-13 DIAGNOSIS — Z1331 Encounter for screening for depression: Secondary | ICD-10-CM

## 2020-09-13 LAB — CBC WITH DIFFERENTIAL/PLATELET
Basophils Absolute: 0.1 10*3/uL (ref 0.0–0.1)
Basophils Relative: 0.6 % (ref 0.0–3.0)
Eosinophils Absolute: 0.1 10*3/uL (ref 0.0–0.7)
Eosinophils Relative: 1.7 % (ref 0.0–5.0)
HCT: 41.8 % (ref 36.0–46.0)
Hemoglobin: 13.7 g/dL (ref 12.0–15.0)
Lymphocytes Relative: 45.1 % (ref 12.0–46.0)
Lymphs Abs: 3.7 10*3/uL (ref 0.7–4.0)
MCHC: 32.8 g/dL (ref 30.0–36.0)
MCV: 89.4 fl (ref 78.0–100.0)
Monocytes Absolute: 0.4 10*3/uL (ref 0.1–1.0)
Monocytes Relative: 5.3 % (ref 3.0–12.0)
Neutro Abs: 3.9 10*3/uL (ref 1.4–7.7)
Neutrophils Relative %: 47.3 % (ref 43.0–77.0)
Platelets: 258 10*3/uL (ref 150.0–400.0)
RBC: 4.68 Mil/uL (ref 3.87–5.11)
RDW: 14.3 % (ref 11.5–15.5)
WBC: 8.3 10*3/uL (ref 4.0–10.5)

## 2020-09-13 LAB — HEMOGLOBIN A1C: Hgb A1c MFr Bld: 5.9 % (ref 4.6–6.5)

## 2020-09-13 LAB — COMPREHENSIVE METABOLIC PANEL
ALT: 25 U/L (ref 0–35)
AST: 20 U/L (ref 0–37)
Albumin: 4.3 g/dL (ref 3.5–5.2)
Alkaline Phosphatase: 59 U/L (ref 39–117)
BUN: 14 mg/dL (ref 6–23)
CO2: 29 mEq/L (ref 19–32)
Calcium: 9.4 mg/dL (ref 8.4–10.5)
Chloride: 101 mEq/L (ref 96–112)
Creatinine, Ser: 0.93 mg/dL (ref 0.40–1.20)
GFR: 66.5 mL/min (ref >60.00)
Glucose, Bld: 76 mg/dL (ref 70–99)
Potassium: 3.8 mEq/L (ref 3.5–5.1)
Sodium: 138 mEq/L (ref 135–145)
Total Bilirubin: 0.4 mg/dL (ref 0.2–1.2)
Total Protein: 7.4 g/dL (ref 6.0–8.3)

## 2020-09-13 LAB — LIPID PANEL
Cholesterol: 216 mg/dL — ABNORMAL HIGH (ref 0–200)
HDL: 62.9 mg/dL (ref >39.00)
LDL Cholesterol: 126 mg/dL — ABNORMAL HIGH (ref 0–99)
NonHDL: 153.55
Total CHOL/HDL Ratio: 3
Triglycerides: 138 mg/dL (ref 0.0–149.0)
VLDL: 27.6 mg/dL (ref 0.0–40.0)

## 2020-09-13 LAB — TSH: TSH: 3.41 u[IU]/mL (ref 0.35–5.50)

## 2020-09-13 LAB — VITAMIN D 25 HYDROXY (VIT D DEFICIENCY, FRACTURES): VITD: 66.62 ng/mL (ref 30.00–100.00)

## 2020-09-13 MED ORDER — MELOXICAM 15 MG PO TABS: 15.0000 mg | ORAL_TABLET | Freq: Every day | ORAL | 0 refills | Status: DC

## 2020-09-13 MED ORDER — TELMISARTAN-HCTZ 40-12.5 MG PO TABS: 1.0000 | ORAL_TABLET | Freq: Every day | ORAL | 3 refills | Status: DC

## 2020-09-13 NOTE — Assessment & Plan Note (Signed)
Chronic Taking vitamin D daily Check vitamin D level  

## 2020-09-13 NOTE — Assessment & Plan Note (Signed)
New Bilateral-left is much worse Swelling and tenderness on left distal Achilles, none on right Symptoms have improved since not exercising Discussed treatment Start meloxicam 15 mg daily-take with food-encouraged her to ice Continue to rest and not exercise for now Deferred referral to sports medicine, but if no improvement she will let me know so I can refer her

## 2020-09-13 NOTE — Assessment & Plan Note (Signed)
Chronic BP well controlled Continue Micardis HCT 40-12.5 mg daily cmp

## 2020-09-13 NOTE — Assessment & Plan Note (Signed)
Chronic Check a1c Low sugar / carb diet Stressed regular exercise  

## 2020-09-20 LAB — HM PAP SMEAR

## 2020-10-27 ENCOUNTER — Encounter: Payer: Self-pay | Admitting: Internal Medicine

## 2020-10-27 NOTE — Progress Notes (Unsigned)
Outside notes received. Information abstracted. Notes sent to scan.  

## 2020-11-02 ENCOUNTER — Encounter: Payer: Self-pay | Admitting: Internal Medicine

## 2020-11-02 DIAGNOSIS — M7661 Achilles tendinitis, right leg: Secondary | ICD-10-CM

## 2020-11-02 DIAGNOSIS — M7662 Achilles tendinitis, left leg: Secondary | ICD-10-CM

## 2020-11-03 NOTE — Addendum Note (Signed)
Addended by: Binnie Rail on: 11/03/2020 09:27 PM   Modules accepted: Orders

## 2020-11-16 ENCOUNTER — Ambulatory Visit (INDEPENDENT_AMBULATORY_CARE_PROVIDER_SITE_OTHER): Payer: 59 | Admitting: Podiatry

## 2020-11-16 ENCOUNTER — Encounter: Payer: Self-pay | Admitting: Podiatry

## 2020-11-16 ENCOUNTER — Ambulatory Visit (INDEPENDENT_AMBULATORY_CARE_PROVIDER_SITE_OTHER): Payer: 59

## 2020-11-16 ENCOUNTER — Other Ambulatory Visit: Payer: Self-pay

## 2020-11-16 DIAGNOSIS — M7661 Achilles tendinitis, right leg: Secondary | ICD-10-CM | POA: Diagnosis not present

## 2020-11-16 DIAGNOSIS — M7662 Achilles tendinitis, left leg: Secondary | ICD-10-CM

## 2020-11-16 MED ORDER — MELOXICAM 15 MG PO TABS: 15.0000 mg | ORAL_TABLET | Freq: Every day | ORAL | 0 refills | Status: DC

## 2020-11-16 NOTE — Patient Instructions (Signed)

## 2020-11-16 NOTE — Progress Notes (Signed)
  Subjective:  Patient ID: Whitney Blake, female    DOB: 07-Nov-1960,   MRN: [id number]  Chief Complaint  Patient presents with   Foot Pain    Bilateral foot pain towards the back of the heel  PT stated that it started back in June the left foot is worse than the right  She stated that it is tender to the touch and at times worse in the morning     60 y.o. female presents for bilateral heel pain left worse than right that has been going on since June. Denies any injury. States it hurts worse in the morning and gets better during the day. PCP gave mobic which helps a little but she continues to have pain  . Denies any other pedal complaints. Denies n/v/f/c.   Past Medical History:  Diagnosis Date   Anemia    PMH of   Cervical spinal stenosis    Dr Arnoldo Morale[place]   Hypertension    Internal hemorrhoids    Migraines    menstrual   Nonspecific ST-T changes 2007   Normal nuclear stress test 2007    Objective:  Physical Exam: Vascular: DP/PT pulses 2/4 bilateral. CFT <3 seconds. Normal hair growth on digits. No edema.  Skin. No lacerations or abrasions bilateral feet.  Musculoskeletal: MMT 5/5 bilateral lower extremities in DF, PF, Inversion and Eversion. Deceased ROM in DF of ankle joint. Tender to insertion of achilles bilateral L>R. No pain with ROM. No pain proximally along tendon.  Neurological: Sensation intact to light touch.   Assessment:   1. Achilles tendonitis, bilateral      Plan:  Patient was evaluated and treated and all questions answered. X-rays reviewed and discussed with patient. No acute fractures or dislocations noted. Mild spurring noted to posterior calcaneus. Mild pes cavus noted.  -Xrays reviewed -Discussed Achilles insertional tendonitis and treatment options with patient.  -Discussed stretching exercises. -Rx Meloxicam provided  -Heel lifts provided and discussed proper shoewear.  -Discussed if no improvement will consider MRI/PT/EPAT/PRP injections.   -Patient to return to office as needed or sooner if condition worsens.    Lorenda Peck, DPM

## 2020-12-28 ENCOUNTER — Other Ambulatory Visit: Payer: Self-pay

## 2020-12-28 ENCOUNTER — Encounter: Payer: Self-pay | Admitting: Podiatry

## 2020-12-28 ENCOUNTER — Ambulatory Visit: Payer: 59 | Admitting: Podiatry

## 2020-12-28 DIAGNOSIS — M7662 Achilles tendinitis, left leg: Secondary | ICD-10-CM | POA: Diagnosis not present

## 2020-12-28 DIAGNOSIS — M7661 Achilles tendinitis, right leg: Secondary | ICD-10-CM | POA: Diagnosis not present

## 2020-12-28 MED ORDER — MELOXICAM 15 MG PO TABS: 15.0000 mg | ORAL_TABLET | Freq: Every day | ORAL | 0 refills | Status: DC

## 2020-12-28 NOTE — Progress Notes (Signed)
  Subjective:  Patient ID: RALPH BENAVIDEZ, female    DOB: 07-Jun-1960,   MRN: [id number]  No chief complaint on file.   60 y.o. female presents for follow-up of bilateral achilles tendonitis. Relates it is doing better. Has been using the heel lifts. Meloxicam helped. Has been stretching. Relates after she finished the meloxicam the pain has started to come back. States when wearing socks she gets a burning tingling pain on the left heel.    . Denies any other pedal complaints. Denies n/v/f/c.   Past Medical History:  Diagnosis Date   Anemia    PMH of   Cervical spinal stenosis    Dr Arnoldo Morale, [place]   Hypertension    Internal hemorrhoids    Migraines    menstrual   Nonspecific ST-T changes 2007   Normal nuclear stress test 2007    Objective:  Physical Exam: Vascular: DP/PT pulses 2/4 bilateral. CFT <3 seconds. Normal hair growth on digits. No edema.  Skin. No lacerations or abrasions bilateral feet.  Musculoskeletal: MMT 5/5 bilateral lower extremities in DF, PF, Inversion and Eversion. Deceased ROM in DF of ankle joint. Tender to insertion of achilles bilateral L>R. No pain with ROM. No pain proximally along tendon.  Neurological: Sensation intact to light touch.   Assessment:   1. Achilles tendonitis, bilateral      Plan:  Patient was evaluated and treated and all questions answered. X-rays reviewed and discussed with patient. No acute fractures or dislocations noted. Mild spurring noted to posterior calcaneus. Mild pes cavus noted.  -Xrays reviewed -Discussed Achilles insertional tendonitis and treatment options with patient.  -Referral to PT placed.  -Rx Meloxicam refilled.  -Continue with heel lifts.  -Discussed if no improvement will consider MRI/PRP injections vs surgical options.   -Patient to return to office in 2 months to check on progress.     Lorenda Peck, DPM

## 2021-01-24 ENCOUNTER — Encounter: Payer: Self-pay | Admitting: Internal Medicine

## 2021-01-27 ENCOUNTER — Other Ambulatory Visit: Payer: Self-pay

## 2021-01-27 ENCOUNTER — Encounter: Payer: Self-pay | Admitting: Physical Therapy

## 2021-01-27 ENCOUNTER — Ambulatory Visit: Payer: 59 | Attending: Podiatry | Admitting: Physical Therapy

## 2021-01-27 DIAGNOSIS — M25571 Pain in right ankle and joints of right foot: Secondary | ICD-10-CM | POA: Diagnosis present

## 2021-01-27 DIAGNOSIS — R2689 Other abnormalities of gait and mobility: Secondary | ICD-10-CM | POA: Insufficient documentation

## 2021-01-27 DIAGNOSIS — M25572 Pain in left ankle and joints of left foot: Secondary | ICD-10-CM | POA: Insufficient documentation

## 2021-01-27 DIAGNOSIS — M7662 Achilles tendinitis, left leg: Secondary | ICD-10-CM | POA: Insufficient documentation

## 2021-01-27 DIAGNOSIS — M7661 Achilles tendinitis, right leg: Secondary | ICD-10-CM | POA: Diagnosis not present

## 2021-01-27 NOTE — Therapy (Addendum)
OUTPATIENT PHYSICAL THERAPY LOWER EXTREMITY EVALUATION   Patient Name: Whitney Blake MRN: [id number] DOB:July 16, 1960, 60 y.o., female Today's Date: 01/27/2021   PT End of Session - 01/27/21 1137     Visit Number 1    Number of Visits 7    Date for PT Re-Evaluation 03/10/21    PT Start Time 4401    PT Stop Time 1230    PT Time Calculation (min) 45 min    Activity Tolerance Patient tolerated treatment well    Behavior During Therapy [facility] for tasks assessed/performed             Past Medical History:  Diagnosis Date   Anemia    PMH of   Cervical spinal stenosis    Dr Arnoldo Morale, [place]   Hypertension    Internal hemorrhoids    Migraines    menstrual   Nonspecific ST-T changes 2007   Normal nuclear stress test 2007   Past Surgical History:  Procedure Laterality Date   COLONOSCOPY  2012   neg; [facility]   CONCUSSION     DUE TO FALL AT AGE 61   FOOT SURGERY     Right    LUMBAR LAMINECTOMY  2003   FOR RUPTURED [place]   ORIF CLAVICULAR FRACTURE     bilaterally @ age 48   TONSILLECTOMY AND ADENOIDECTOMY     Patient Active Problem List   Diagnosis Date Noted   Achilles tendinitis of both lower extremities 09/13/2020   Thumb pain, left 05/26/2020   Chronic right shoulder pain 05/26/2020   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   Anosmia 01/21/2020   Ageusia 01/21/2020   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   History of COVID-19 11/05/2019   Memory loss 11/05/2019   Smell or taste sensation disturbance 07/03/2019   Bursitis of right foot 05/01/2019   Interdigital neuroma of right foot 02/25/2019   Leg cramping 07/02/2018   Unspecified dyspareunia (CODE) 02/72/5366   Lichen sclerosus of female genitalia 05/04/2017   Vitamin D deficiency 05/03/2017   Lumbar radiculopathy 05/15/2016   Prediabetes 04/29/2016   Leg pain, bilateral 04/23/2015   Nephrolithiasis 09/06/2012   Essential hypertension 12/07/2011   Migraine 12/21/2008   Anemia 09/03/2007   NONSPECIFIC ABNORMAL  ELECTROCARDIOGRAM 09/03/2007   SPINAL STENOSIS 06/14/2006    PCP: Binnie Rail, MD  REFERRING PROVIDER: Lorenda Peck, MD  REFERRING DIAG: Achilles tendonitis, bilateral [Y40.34, M76.62]  THERAPY DIAG:  Pain in left ankle and joints of left foot - Plan: PT plan of care cert/re-cert  Pain in right ankle and joints of right foot - Plan: PT plan of care cert/re-cert  Other abnormalities of gait and mobility - Plan: PT plan of care cert/re-cert  ONSET DATE: June/ 2022  SUBJECTIVE:   SUBJECTIVE STATEMENT: Pt reports bil ankle pain starting in June of 2022. She reports having pain in the L ankle with no specific MOI and it started in the R ankle. The pain has stayed consistent and is worse in the morning and is better with movement. She notes some swelling in the L ankle but not in the R. She noted only relief is with the anti-inflammatory. Prior to this she reported no other issues in the achilles.   PERTINENT HISTORY: Hx of R foot surgery to remove a neuroma.   PAIN:  Are you having pain? No VAS scale: 0/10 currently but at worst 5/10 Pain location: bil achilles L>R Pain orientation: Right and Left  PAIN TYPE: N/A Pain description: intermittent  Aggravating factors:  When it does bother her getting up in the morning  Relieving factors: moving around  PRECAUTIONS: None  WEIGHT BEARING RESTRICTIONS No  FALLS:  Has patient fallen in last 6 months? No, Number of falls: 0  PLOF: Independent  PATIENT GOALS to stop pain, and avoid having to take medicaiton   OBJECTIVE:   DIAGNOSTIC FINDINGS: Xray on 11/16/2020 at [facility] office  PATIENT SURVEYS:  FOTO 91%, predicted  87%   COGNITION:  Overall cognitive status: Within functional limits for tasks assessed    POSTURE:  Forward head  Palpation:  TPP along bil posterior calcaneal tubercle with L>R, and distal achilles tendion bil Multiple triggers points noted in bil calves    LE AROM/PROM:  A/PROM  Right 01/27/2021 Left 01/27/2021  Hip flexion    Hip extension    Hip abduction    Hip adduction    Hip internal rotation    Hip external rotation    Knee flexion    Knee extension    Ankle dorsiflexion WNL WNL  Ankle plantarflexion WNL WNL  Ankle inversion WNL WNL  Ankle eversion WNL WNL   (Blank rows = not tested)  LE MMT:  MMT Right 01/27/2021 Left 01/27/2021  Hip flexion    Hip extension    Hip abduction    Hip adduction    Hip internal rotation    Hip external rotation    Knee flexion    Knee extension    Ankle dorsiflexion 5/5 5/5  Ankle plantarflexion 5/5 5/5  Ankle inversion 4/5 4/5  Ankle eversion 4/5 4/5   (Blank rows = not tested)  LOWER EXTREMITY SPECIAL TESTS:  Ankle special tests: Anterior drawer test: negative, Talar tilt test: negative, and Thompson's test: negative  JOINT MOBILITY ASSESSMENT:  WNL   GAIT: Distance walked: N/A Assistive device utilized: None Level of assistance: Complete Independence Comments: No major deviations noted    TODAY'S TREATMENT: [facility] Adult PT Treatment:     01/27/2021 Therapeutic Exercise: Gastroc/ soleus stretch 1 x 30 sec ea Manual Therapy:  MTPR along the gasroc/ soleus bil       PATIENT EDUCATION:  Education details: Evaluation findings, POC, goals, HEP with proper form/ rationale Person educated: Patient Education method: Explanation Education comprehension: verbalized understanding   HOME EXERCISE PROGRAM: Access Code: [email or web address] URL: [email or web address] Date: 01/27/2021 Prepared by: Starr Lake  Exercises Soleus Stretch on Wall - 1 x daily - 7 x weekly - 2 sets - 2 reps - 30 seconds hold Gastroc Stretch on Wall - 1 x daily - 7 x weekly - 2 sets - 2 reps - 30 seconds hold Towel Scrunches - 1 x daily - 7 x weekly - 2 sets - 10 reps Seated Ankle Eversion with Resistance - 1 x daily - 7 x weekly - 2 sets - 10 reps Ankle Inversion with Anchored Resistance at Table - 1 x  daily - 7 x weekly - 2 sets - 10 reps   ASSESSMENT:  CLINICAL IMPRESSION: Patient is a 60 y.o. F who was seen today for physical therapy evaluation and treatment for bil ankle pain. She demonstrates function ankle ROM and strength with mild weakness noted with bil ankle inversion/ eversion. TTP located along bil gastroc/ soleus with multiple trigger points noted and tenderness at bil posterior calcaneal tuberosity with L>R Objective impairments include decreased strength, increased fascial restrictions, increased muscle spasms, postural dysfunction, and pain.are affecting patient's functional outcome.  Pt responsed well in session to MTPR along bil gastroc/ soleus followed  with stretch. Patient will benefit from skilled PT to address above impairments and improve overall function.  REHAB POTENTIAL: Good  CLINICAL DECISION MAKING: Stable/uncomplicated  EVALUATION COMPLEXITY: Low   GOALS: Goals reviewed with patient? Yes  SHORT TERM GOALS:  STG Name Target Date Goal status  1 Pt to be IND with initial HEP Baseline:  02/17/2021 INITIAL   LONG TERM GOALS:   LTG Name Target Date Goal status  1 To reduce bil calf muscle tension to reduce achilles tension/ pain with out the need for anti-inflammatories Baseline: 03/10/2021 INITIAL  2 Increase bil ankle inversion/ eversion strength to >/= 4+/5 Baseline: 03/10/2021 INITIAL  3 Increase FOTO score by atleast 1% to demo improvement in function Baseline: 03/10/2021 INITIAL  4 Pt to be IND with all HEP and is able to maintain and progress current LOF Baseline: 03/10/2021 INITIAL    PLAN: PT FREQUENCY: 1x/week  PT DURATION: 6 weeks  PLANNED INTERVENTIONS: Therapeutic exercises, Therapeutic activity, Neuro Muscular re-education, Balance training, Gait training, Patient/Family education, Joint mobilization, Stair training, Dry Needling, Taping, Ultrasound, and Manual therapy  PLAN FOR NEXT SESSION: Reviewed HEP and update PRN,  MTPR vs DN and  STW along the gastroc/ soleus, gross ankle strength   Lior Hoen PT, DPT, LAT, ATC  01/27/21  12:54 PM

## 2021-02-02 ENCOUNTER — Ambulatory Visit: Payer: 59

## 2021-02-02 ENCOUNTER — Other Ambulatory Visit: Payer: Self-pay

## 2021-02-02 DIAGNOSIS — R2689 Other abnormalities of gait and mobility: Secondary | ICD-10-CM

## 2021-02-02 DIAGNOSIS — M25571 Pain in right ankle and joints of right foot: Secondary | ICD-10-CM

## 2021-02-02 DIAGNOSIS — M25572 Pain in left ankle and joints of left foot: Secondary | ICD-10-CM | POA: Diagnosis not present

## 2021-02-02 NOTE — Therapy (Signed)
OUTPATIENT PHYSICAL THERAPY TREATMENT NOTE   Patient Name: Whitney Blake MRN: [id number] DOB:1961/01/10, 60 y.o., female Today's Date: 02/02/2021  PCP: Binnie Rail, MD REFERRING PROVIDER: Lorenda Peck, MD   PT End of Session - 02/02/21 1446     Visit Number 2    Number of Visits 7    Date for PT Re-Evaluation 03/10/21    PT Start Time 7619    PT Stop Time 1530    PT Time Calculation (min) 45 min    Activity Tolerance Patient tolerated treatment well    Behavior During Therapy [facility] for tasks assessed/performed             Past Medical History:  Diagnosis Date   Anemia    PMH of   Cervical spinal stenosis    Dr Arnoldo Morale, [place]   Hypertension    Internal hemorrhoids    Migraines    menstrual   Nonspecific ST-T changes 2007   Normal nuclear stress test 2007   Past Surgical History:  Procedure Laterality Date   COLONOSCOPY  2012   neg; [facility]   CONCUSSION     DUE TO FALL AT AGE 31   FOOT SURGERY     Right    LUMBAR LAMINECTOMY  2003   FOR RUPTURED [place]   ORIF CLAVICULAR FRACTURE     bilaterally @ age 61   TONSILLECTOMY AND ADENOIDECTOMY     Patient Active Problem List   Diagnosis Date Noted   Achilles tendinitis of both lower extremities 09/13/2020   Thumb pain, left 05/26/2020   Chronic right shoulder pain 05/26/2020   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   Anosmia 01/21/2020   Ageusia 01/21/2020   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   History of COVID-19 11/05/2019   Memory loss 11/05/2019   Smell or taste sensation disturbance 07/03/2019   Bursitis of right foot 05/01/2019   Interdigital neuroma of right foot 02/25/2019   Leg cramping 07/02/2018   Unspecified dyspareunia (CODE) 50/93/2671   Lichen sclerosus of female genitalia 05/04/2017   Vitamin D deficiency 05/03/2017   Lumbar radiculopathy 05/15/2016   Prediabetes 04/29/2016   Leg pain, bilateral 04/23/2015   Nephrolithiasis 09/06/2012   Essential hypertension 12/07/2011   Migraine  12/21/2008   Anemia 09/03/2007   NONSPECIFIC ABNORMAL ELECTROCARDIOGRAM 09/03/2007   SPINAL STENOSIS 06/14/2006    REFERRING DIAG: B foot and ankle pain  THERAPY DIAG:  Pain in left ankle and joints of left foot  Pain in right ankle and joints of right foot  Other abnormalities of gait and mobility  PERTINENT HISTORY: see eval  PRECAUTIONS: none  SUBJECTIVE: Less pain today, HEP helps  PAIN:  Are you having pain? Yes VAS scale: 4/10 Pain location: B feet Pain orientation: Bilateral  PAIN TYPE: aching Pain description: burning  Aggravating factors: weightbearing Relieving factors: rest       OBJECTIVE:    DIAGNOSTIC FINDINGS: Xray on 11/16/2020 at [facility] office   PATIENT SURVEYS:  FOTO 91%, predicted  87%     COGNITION:          Overall cognitive status: Within functional limits for tasks assessed                POSTURE:  Forward head   Palpation:  TPP along bil posterior calcaneal tubercle with L>R, and distal achilles tendion bil Multiple triggers points noted in bil calves       LE AROM/PROM:   A/PROM Right 01/27/2021 Left 01/27/2021  Hip flexion  Hip extension      Hip abduction      Hip adduction      Hip internal rotation      Hip external rotation      Knee flexion      Knee extension      Ankle dorsiflexion WNL WNL  Ankle plantarflexion WNL WNL  Ankle inversion WNL WNL  Ankle eversion WNL WNL   (Blank rows = not tested)   LE MMT:   MMT Right 01/27/2021 Left 01/27/2021  Hip flexion      Hip extension      Hip abduction      Hip adduction      Hip internal rotation      Hip external rotation      Knee flexion      Knee extension      Ankle dorsiflexion 5/5 5/5  Ankle plantarflexion 5/5 5/5  Ankle inversion 4/5 4/5  Ankle eversion 4/5 4/5   (Blank rows = not tested)   LOWER EXTREMITY SPECIAL TESTS:  Ankle special tests: Anterior drawer test: negative, Talar tilt test: negative, and Thompson's test: negative   JOINT  MOBILITY ASSESSMENT:  WNL     GAIT: Distance walked: N/A Assistive device utilized: None Level of assistance: Complete Independence Comments: No major deviations noted       TODAY'S TREATMENT: [facility] Adult PT Treatment:     02/02/2021 Therapeutic Exercise: Gastroc/ soleus stretch 1 x 30 sec ea Ankle circles Manual Therapy:  MTPR along the gasroc/ soleus bil Posterior fibular head mobs into DF until pain free STM to calcaneal fibular ligament Mobs into eversion             PATIENT EDUCATION:  Education details: Evaluation findings, POC, goals, HEP with proper form/ rationale Person educated: Patient Education method: Explanation Education comprehension: verbalized understanding     HOME EXERCISE PROGRAM: Access Code: [email or web address] URL: [email or web address] Date: 01/27/2021 Prepared by: Starr Lake   Exercises Soleus Stretch on Wall - 1 x daily - 7 x weekly - 2 sets - 2 reps - 30 seconds hold Gastroc Stretch on Wall - 1 x daily - 7 x weekly - 2 sets - 2 reps - 30 seconds hold Towel Scrunches - 1 x daily - 7 x weekly - 2 sets - 10 reps Seated Ankle Eversion with Resistance - 1 x daily - 7 x weekly - 2 sets - 10 reps Ankle Inversion with Anchored Resistance at Table - 1 x daily - 7 x weekly - 2 sets - 10 reps     ASSESSMENT:   CLINICAL IMPRESSION: Less pain today and has been consistent with HEP, focus of todays session was ROM and posterior fibular head mobs to relieve symptoms.  Reviewed HEP and added ankle circles   REHAB POTENTIAL: Good   CLINICAL DECISION MAKING: Stable/uncomplicated   EVALUATION COMPLEXITY: Low     GOALS: Goals reviewed with patient? Yes   SHORT TERM GOALS:   STG Name Target Date Goal status  1 Pt to be IND with initial HEP Baseline:  02/17/2021 INITIAL    LONG TERM GOALS:    LTG Name Target Date Goal status  1 To reduce bil calf muscle tension to reduce achilles tension/ pain with out the need for  anti-inflammatories Baseline: 03/10/2021 INITIAL  2 Increase bil ankle inversion/ eversion strength to >/= 4+/5 Baseline: 03/10/2021 INITIAL  3 Increase FOTO score by atleast 1% to demo improvement in function Baseline: 03/10/2021 INITIAL  4 Pt to  be IND with all HEP and is able to maintain and progress current LOF Baseline: 03/10/2021 INITIAL      PLAN: PT FREQUENCY: 1x/week   PT DURATION: 6 weeks   PLANNED INTERVENTIONS: Therapeutic exercises, Therapeutic activity, Neuro Muscular re-education, Balance training, Gait training, Patient/Family education, Joint mobilization, Stair training, Dry Needling, Taping, Ultrasound, and Manual therapy   PLAN FOR NEXT SESSION: Reviewed HEP and update PRN,  MTPR vs DN and STW along the gastroc/ soleus, gross ankle strength, fibular head mobs for pain relief    Jacqulynn Cadet Jaxsyn Azam PT 02/02/2021, 4:02 PM

## 2021-02-09 ENCOUNTER — Other Ambulatory Visit: Payer: Self-pay

## 2021-02-09 ENCOUNTER — Ambulatory Visit: Payer: 59 | Admitting: Physical Therapy

## 2021-02-09 ENCOUNTER — Encounter: Payer: Self-pay | Admitting: Physical Therapy

## 2021-02-09 DIAGNOSIS — M25572 Pain in left ankle and joints of left foot: Secondary | ICD-10-CM

## 2021-02-09 DIAGNOSIS — R2689 Other abnormalities of gait and mobility: Secondary | ICD-10-CM

## 2021-02-09 DIAGNOSIS — M25571 Pain in right ankle and joints of right foot: Secondary | ICD-10-CM

## 2021-02-09 NOTE — Therapy (Signed)
OUTPATIENT PHYSICAL THERAPY TREATMENT NOTE   Patient Name: Whitney Blake MRN: [id number] DOB:1960-05-01, 60 y.o., female Today's Date: 02/09/2021  PCP: Binnie Rail, MD REFERRING PROVIDER: Lorenda Peck, MD   PT End of Session - 02/09/21 1406     Visit Number 3    Number of Visits 7    Date for PT Re-Evaluation 03/10/21    PT Start Time 7829    PT Stop Time 1445    PT Time Calculation (min) 41 min             Past Medical History:  Diagnosis Date   Anemia    PMH of   Cervical spinal stenosis    Dr Arnoldo Morale, [place]   Hypertension    Internal hemorrhoids    Migraines    menstrual   Nonspecific ST-T changes 2007   Normal nuclear stress test 2007   Past Surgical History:  Procedure Laterality Date   COLONOSCOPY  2012   neg; [facility]   CONCUSSION     DUE TO FALL AT AGE 21   FOOT SURGERY     Right    LUMBAR LAMINECTOMY  2003   FOR RUPTURED [place]   ORIF CLAVICULAR FRACTURE     bilaterally @ age 8   TONSILLECTOMY AND ADENOIDECTOMY     Patient Active Problem List   Diagnosis Date Noted   Achilles tendinitis of both lower extremities 09/13/2020   Thumb pain, left 05/26/2020   Chronic right shoulder pain 05/26/2020   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   Anosmia 01/21/2020   Ageusia 01/21/2020   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   History of COVID-19 11/05/2019   Memory loss 11/05/2019   Smell or taste sensation disturbance 07/03/2019   Bursitis of right foot 05/01/2019   Interdigital neuroma of right foot 02/25/2019   Leg cramping 07/02/2018   Unspecified dyspareunia (CODE) 56/21/3086   Lichen sclerosus of female genitalia 05/04/2017   Vitamin D deficiency 05/03/2017   Lumbar radiculopathy 05/15/2016   Prediabetes 04/29/2016   Leg pain, bilateral 04/23/2015   Nephrolithiasis 09/06/2012   Essential hypertension 12/07/2011   Migraine 12/21/2008   Anemia 09/03/2007   NONSPECIFIC ABNORMAL ELECTROCARDIOGRAM 09/03/2007   SPINAL STENOSIS 06/14/2006     REFERRING DIAG: B foot and ankle pain  THERAPY DIAG:  Pain in left ankle and joints of left foot  Pain in right ankle and joints of right foot  Other abnormalities of gait and mobility  PERTINENT HISTORY: see eval  PRECAUTIONS: none  SUBJECTIVE: 1/10 pain in the left heel otherwise no  right ankle pain except in the morning.   PAIN:  Are you having pain? Yes VAS scale: 1/10 Pain location: Left heel  Pain orientation: Left PAIN TYPE: aching Pain description: burning  Aggravating factors: weightbearing Relieving factors: rest       OBJECTIVE:    DIAGNOSTIC FINDINGS: Xray on 11/16/2020 at [facility] office   PATIENT SURVEYS:  FOTO 91%, predicted  87%     COGNITION:          Overall cognitive status: Within functional limits for tasks assessed                POSTURE:  Forward head   Palpation:  TPP along bil posterior calcaneal tubercle with L>R, and distal achilles tendion bil Multiple triggers points noted in bil calves       LE AROM/PROM:   A/PROM Right 01/27/2021 Left 01/27/2021  Hip flexion      Hip extension  Hip abduction      Hip adduction      Hip internal rotation      Hip external rotation      Knee flexion      Knee extension      Ankle dorsiflexion WNL WNL  Ankle plantarflexion WNL WNL  Ankle inversion WNL WNL  Ankle eversion WNL WNL   (Blank rows = not tested)   LE MMT:   MMT Right 01/27/2021 Left 01/27/2021  Hip flexion      Hip extension      Hip abduction      Hip adduction      Hip internal rotation      Hip external rotation      Knee flexion      Knee extension      Ankle dorsiflexion 5/5 5/5  Ankle plantarflexion 5/5 5/5  Ankle inversion 4/5 4/5  Ankle eversion 4/5 4/5   (Blank rows = not tested)   LOWER EXTREMITY SPECIAL TESTS:  Ankle special tests: Anterior drawer test: negative, Talar tilt test: negative, and Thompson's test: negative   JOINT MOBILITY ASSESSMENT:  WNL     GAIT: Distance walked:  N/A Assistive device utilized: None Level of assistance: Complete Independence Comments: No major deviations noted   Today's Treatment 02/09/21 Therapeutic Exercise: Gastroc/ soleus stretch 2 x 30 sec ea Heel raise to toe raise x 10 Tandem stance, added airex x 30 sec each foot back Slant board gastroc and soleus stretch x 1 min each Red band inv ev x 1 5 each bilat  Manual Therapy:   Posterior fibular head mobs into DF Cross friction massage to heel  Passive gastroc and soleus stretches prone    [facility] Adult PT Treatment:     02/02/2021 Therapeutic Exercise: Gastroc/ soleus stretch 1 x 30 sec ea Ankle circles Manual Therapy:  MTPR along the gasroc/ soleus bil Posterior fibular head mobs into DF until pain free STM to calcaneal fibular ligament Mobs into eversion             PATIENT EDUCATION:  Education details: updated HEP Person educated: Patient Education method: Explanation Education comprehension: verbalized understanding     HOME EXERCISE PROGRAM: Access Code: [email or web address] URL: [email or web address] Date: 02/09/2021 Prepared by: Hessie Diener  Exercises Soleus Stretch on Wall - 1 x daily - 7 x weekly - 2 sets - 2 reps - 30 seconds hold Gastroc Stretch on Wall - 1 x daily - 7 x weekly - 2 sets - 2 reps - 30 seconds hold Towel Scrunches - 1 x daily - 7 x weekly - 2 sets - 10 reps Seated Ankle Eversion with Resistance - 1 x daily - 7 x weekly - 2 sets - 10 reps Ankle Inversion with Anchored Resistance at Table - 1 x daily - 7 x weekly - 2 sets - 10 reps Seated Ankle Circles - 2 x daily - 7 x weekly - 2 sets - 10 reps Standing Heel Raises - 1 x daily - 7 x weekly - 2 sets - 10 reps    ASSESSMENT:   CLINICAL IMPRESSION: Pt notes improvement after last session for the next day. Today she has pain at distal achlles insertion. Reviewed stretches and progressed with strength and stability, Updated HEP. Repeated fibular head mobs. She toelrated  progression well without increased pain.    REHAB POTENTIAL: Good   CLINICAL DECISION MAKING: Stable/uncomplicated   EVALUATION COMPLEXITY: Low     GOALS: Goals reviewed with patient? Yes  SHORT TERM GOALS:   STG Name Target Date Goal status  1 Pt to be IND with initial HEP Baseline:  02/17/2021 INITIAL    LONG TERM GOALS:    LTG Name Target Date Goal status  1 To reduce bil calf muscle tension to reduce achilles tension/ pain with out the need for anti-inflammatories Baseline: 03/10/2021 INITIAL  2 Increase bil ankle inversion/ eversion strength to >/= 4+/5 Baseline: 03/10/2021 INITIAL  3 Increase FOTO score by atleast 1% to demo improvement in function Baseline: 03/10/2021 INITIAL  4 Pt to be IND with all HEP and is able to maintain and progress current LOF Baseline: 03/10/2021 INITIAL      PLAN: PT FREQUENCY: 1x/week   PT DURATION: 6 weeks   PLANNED INTERVENTIONS: Therapeutic exercises, Therapeutic activity, Neuro Muscular re-education, Balance training, Gait training, Patient/Family education, Joint mobilization, Stair training, Dry Needling, Taping, Ultrasound, and Manual therapy   PLAN FOR NEXT SESSION: Reviewed HEP and update PRN,  MTPR vs DN and STW along the gastroc/ soleus, gross ankle strength, fibular head mobs for pain relief    Dorene Ar PT 02/09/2021, 2:51 PM

## 2021-02-15 ENCOUNTER — Other Ambulatory Visit: Payer: Self-pay

## 2021-02-15 ENCOUNTER — Encounter: Payer: Self-pay | Admitting: Physical Therapy

## 2021-02-15 ENCOUNTER — Ambulatory Visit: Payer: 59 | Attending: Podiatry | Admitting: Physical Therapy

## 2021-02-15 DIAGNOSIS — M25571 Pain in right ankle and joints of right foot: Secondary | ICD-10-CM | POA: Insufficient documentation

## 2021-02-15 DIAGNOSIS — R2689 Other abnormalities of gait and mobility: Secondary | ICD-10-CM | POA: Diagnosis present

## 2021-02-15 DIAGNOSIS — M25572 Pain in left ankle and joints of left foot: Secondary | ICD-10-CM | POA: Insufficient documentation

## 2021-02-15 NOTE — Therapy (Signed)
OUTPATIENT PHYSICAL THERAPY TREATMENT NOTE   Patient Name: Whitney Blake MRN: [id number] DOB:1960/04/12, 61 y.o., female Today's Date: 02/15/2021  PCP: Binnie Rail, MD REFERRING PROVIDER: Lorenda Peck, MD   PT End of Session - 02/15/21 1320     Visit Number 4    Number of Visits 7    Date for PT Re-Evaluation 03/10/21    PT Start Time 1317    PT Stop Time 6314    PT Time Calculation (min) 41 min             Past Medical History:  Diagnosis Date   Anemia    PMH of   Cervical spinal stenosis    Dr Arnoldo Morale, [place]   Hypertension    Internal hemorrhoids    Migraines    menstrual   Nonspecific ST-T changes 2007   Normal nuclear stress test 2007   Past Surgical History:  Procedure Laterality Date   COLONOSCOPY  2012   neg; [facility]   CONCUSSION     DUE TO FALL AT AGE 32   FOOT SURGERY     Right    LUMBAR LAMINECTOMY  2003   FOR RUPTURED [place]   ORIF CLAVICULAR FRACTURE     bilaterally @ age 79   TONSILLECTOMY AND ADENOIDECTOMY     Patient Active Problem List   Diagnosis Date Noted   Achilles tendinitis of both lower extremities 09/13/2020   Thumb pain, left 05/26/2020   Chronic right shoulder pain 05/26/2020   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   Anosmia 01/21/2020   Ageusia 01/21/2020   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   History of COVID-19 11/05/2019   Memory loss 11/05/2019   Smell or taste sensation disturbance 07/03/2019   Bursitis of right foot 05/01/2019   Interdigital neuroma of right foot 02/25/2019   Leg cramping 07/02/2018   Unspecified dyspareunia (CODE) 97/03/6376   Lichen sclerosus of female genitalia 05/04/2017   Vitamin D deficiency 05/03/2017   Lumbar radiculopathy 05/15/2016   Prediabetes 04/29/2016   Leg pain, bilateral 04/23/2015   Nephrolithiasis 09/06/2012   Essential hypertension 12/07/2011   Migraine 12/21/2008   Anemia 09/03/2007   NONSPECIFIC ABNORMAL ELECTROCARDIOGRAM 09/03/2007   SPINAL STENOSIS 06/14/2006     REFERRING DIAG: B foot and ankle pain  THERAPY DIAG:  Pain in left ankle and joints of left foot  Pain in right ankle and joints of right foot  Other abnormalities of gait and mobility  PERTINENT HISTORY: see eval  PRECAUTIONS: none  SUBJECTIVE: 1/10 pain in the left heel otherwise no  right ankle pain except in the morning.   PAIN:  Are you having pain? No VAS scale: 0/10 Pain location: Left heel  Pain orientation: Left PAIN TYPE:  Pain description:  Aggravating factors: touching left heel, different shoes Relieving factors: rest       OBJECTIVE:    DIAGNOSTIC FINDINGS: Xray on 11/16/2020 at [facility] office   PATIENT SURVEYS:  FOTO 91%, predicted  87%     COGNITION:          Overall cognitive status: Within functional limits for tasks assessed                POSTURE:  Forward head   Palpation:  TPP along bil posterior calcaneal tubercle with L>R, and distal achilles tendion bil Multiple triggers points noted in bil calves       LE AROM/PROM:   A/PROM Right 01/27/2021 Left 01/27/2021  Hip flexion      Hip  extension      Hip abduction      Hip adduction      Hip internal rotation      Hip external rotation      Knee flexion      Knee extension      Ankle dorsiflexion WNL WNL  Ankle plantarflexion WNL WNL  Ankle inversion WNL WNL  Ankle eversion WNL WNL   (Blank rows = not tested)   LE MMT:   MMT Right 01/27/2021 Left 01/27/2021  Hip flexion      Hip extension      Hip abduction      Hip adduction      Hip internal rotation      Hip external rotation      Knee flexion      Knee extension      Ankle dorsiflexion 5/5 5/5  Ankle plantarflexion 5/5 5/5  Ankle inversion 4/5 4/5  Ankle eversion 4/5 4/5   (Blank rows = not tested)   LOWER EXTREMITY SPECIAL TESTS:  Ankle special tests: Anterior drawer test: negative, Talar tilt test: negative, and Thompson's test: negative   JOINT MOBILITY ASSESSMENT:  WNL     GAIT: Distance walked:  N/A Assistive device utilized: None Level of assistance: Complete Independence Comments: No major deviations noted   Today's Treatment 02/15/21 Therapeutic Exercise:  Rec bike L 2 x 5 min  Gastroc/ soleus stretch 2 x 30 sec ea Heel raise to toe raise x 10 x 2 Eccentric heel raise alternating x 10 each SLS on airex 60 sec each - no UE  Rebounder  SLS toss red ball x 20 tosses each  Rebounder SLS toss red ball from foam oval - 7 toss right, 5 toss left  Slant board gastroc and soleus stretch x 1 min each Green band inv ev x 15 each bilat  Manual Therapy:   Posterior to anterior fibular head mobs  Cross friction massage to heel  Passive gastroc and soleus stretches prone Calcaneal distraction with passive DF  Treatment 02/09/21 Therapeutic Exercise: Gastroc/ soleus stretch 2 x 30 sec ea Heel raise to toe raise x 10 Tandem stance, added airex x 30 sec each foot back Slant board gastroc and soleus stretch x 1 min each Red band inv ev x 1 5 each bilat  Manual Therapy:   Posterior fibular head mobs into DF Cross friction massage to heel  Passive gastroc and soleus stretches prone    [facility] Adult PT Treatment:     02/02/2021 Therapeutic Exercise: Gastroc/ soleus stretch 1 x 30 sec ea Ankle circles Manual Therapy:  MTPR along the gasroc/ soleus bil Posterior fibular head mobs into DF until pain free STM to calcaneal fibular ligament Mobs into eversion             PATIENT EDUCATION:  Education details: updated HEP Person educated: Patient Education method: Explanation Education comprehension: verbalized understanding     HOME EXERCISE PROGRAM: Access Code: [email or web address] URL: [email or web address] Date: 02/09/2021 Prepared by: Hessie Diener  Exercises Soleus Stretch on Wall - 1 x daily - 7 x weekly - 2 sets - 2 reps - 30 seconds hold Gastroc Stretch on Wall - 1 x daily - 7 x weekly - 2 sets - 2 reps - 30 seconds hold Towel Scrunches - 1 x daily - 7 x  weekly - 2 sets - 10 reps Seated Ankle Eversion with Resistance - 1 x daily - 7 x weekly - 2 sets - 10 reps Ankle  Inversion with Anchored Resistance at Table - 1 x daily - 7 x weekly - 2 sets - 10 reps Seated Ankle Circles - 2 x daily - 7 x weekly - 2 sets - 10 reps Standing Heel Raises - 1 x daily - 7 x weekly - 2 sets - 10 reps    ASSESSMENT:   CLINICAL IMPRESSION: Pt reports overall improvement in calf tightness. She does still have left heel pain that is aggravated with touching it or prolonged activity in her feet. Progressed with stability in unlevel and dynamic positions and she tolerated this well and reported a slight reproduction of heel pain with higher level challenges. Continued with passive stretching and ankle mobs. She felt reduced heel pain with calcaneal distraction. She has one more visit left and wants to discharge to her HEP.    REHAB POTENTIAL: Good   CLINICAL DECISION MAKING: Stable/uncomplicated   EVALUATION COMPLEXITY: Low     GOALS: Goals reviewed with patient? Yes   SHORT TERM GOALS:   STG Name Target Date Goal status  1 Pt to be IND with initial HEP Baseline:  02/17/2021 INITIAL    LONG TERM GOALS:    LTG Name Target Date Goal status  1 To reduce bil calf muscle tension to reduce achilles tension/ pain with out the need for anti-inflammatories Baseline: 03/10/2021 INITIAL  2 Increase bil ankle inversion/ eversion strength to >/= 4+/5 Baseline: 03/10/2021 INITIAL  3 Increase FOTO score by atleast 1% to demo improvement in function Baseline: 03/10/2021 INITIAL  4 Pt to be IND with all HEP and is able to maintain and progress current LOF Baseline: 03/10/2021 INITIAL      PLAN: PT FREQUENCY: 1x/week   PT DURATION: 6 weeks   PLANNED INTERVENTIONS: Therapeutic exercises, Therapeutic activity, Neuro Muscular re-education, Balance training, Gait training, Patient/Family education, Joint mobilization, Stair training, Dry Needling, Taping, Ultrasound, and  Manual therapy   PLAN FOR NEXT SESSION: Reviewed HEP , calcaneal distraction, discharge next visit    Dorene Ar PTA 02/15/2021, 3:31 PM

## 2021-02-22 ENCOUNTER — Ambulatory Visit: Payer: 59 | Admitting: Physical Therapy

## 2021-02-22 ENCOUNTER — Other Ambulatory Visit: Payer: Self-pay

## 2021-02-22 ENCOUNTER — Encounter: Payer: Self-pay | Admitting: Physical Therapy

## 2021-02-22 DIAGNOSIS — R2689 Other abnormalities of gait and mobility: Secondary | ICD-10-CM

## 2021-02-22 DIAGNOSIS — M25572 Pain in left ankle and joints of left foot: Secondary | ICD-10-CM | POA: Diagnosis not present

## 2021-02-22 DIAGNOSIS — M25571 Pain in right ankle and joints of right foot: Secondary | ICD-10-CM

## 2021-02-22 NOTE — Therapy (Addendum)
OUTPATIENT PHYSICAL THERAPY TREATMENT NOTE / Discharge   Patient Name: Whitney Blake MRN: [id number] DOB:Oct 21, 1960, 61 y.o., female Today's Date: 02/22/2021  PCP: Binnie Rail, MD REFERRING PROVIDER: Lorenda Peck, MD   PT End of Session - 02/22/21 1153     Visit Number 5    Number of Visits 7    Date for PT Re-Evaluation 03/10/21    PT Start Time 1150    PT Stop Time 1230    PT Time Calculation (min) 40 min             Past Medical History:  Diagnosis Date   Anemia    PMH of   Cervical spinal stenosis    Dr Arnoldo Morale, [place]   Hypertension    Internal hemorrhoids    Migraines    menstrual   Nonspecific ST-T changes 2007   Normal nuclear stress test 2007   Past Surgical History:  Procedure Laterality Date   COLONOSCOPY  2012   neg; [facility]   CONCUSSION     DUE TO FALL AT AGE 81   FOOT SURGERY     Right    LUMBAR LAMINECTOMY  2003   FOR RUPTURED [place]   ORIF CLAVICULAR FRACTURE     bilaterally @ age 35   TONSILLECTOMY AND ADENOIDECTOMY     Patient Active Problem List   Diagnosis Date Noted   Achilles tendinitis of both lower extremities 09/13/2020   Thumb pain, left 05/26/2020   Chronic right shoulder pain 05/26/2020   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   Anosmia 01/21/2020   Ageusia 01/21/2020   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   History of COVID-19 11/05/2019   Memory loss 11/05/2019   Smell or taste sensation disturbance 07/03/2019   Bursitis of right foot 05/01/2019   Interdigital neuroma of right foot 02/25/2019   Leg cramping 07/02/2018   Unspecified dyspareunia (CODE) 74/25/9563   Lichen sclerosus of female genitalia 05/04/2017   Vitamin D deficiency 05/03/2017   Lumbar radiculopathy 05/15/2016   Prediabetes 04/29/2016   Leg pain, bilateral 04/23/2015   Nephrolithiasis 09/06/2012   Essential hypertension 12/07/2011   Migraine 12/21/2008   Anemia 09/03/2007   NONSPECIFIC ABNORMAL ELECTROCARDIOGRAM 09/03/2007   SPINAL STENOSIS  06/14/2006    REFERRING DIAG: B foot and ankle pain  THERAPY DIAG:  Pain in left ankle and joints of left foot  Pain in right ankle and joints of right foot  Other abnormalities of gait and mobility  PERTINENT HISTORY: Hx of R foot surgery to remove a neuroma.  PRECAUTIONS: none  SUBJECTIVE: No pain now. At the worse my pain can be 1-2/10 in the left heel. Right foot no pain ever. I think the exercises are helping both feet. I think I will finish PT today and continue my HEP.    PAIN:  Are you having pain? No VAS scale: 0/10 Pain location: Left heel  Pain orientation: Left PAIN TYPE:  Pain description:  Aggravating factors: touching left heel, different shoes Relieving factors: rest       OBJECTIVE:    DIAGNOSTIC FINDINGS: Xray on 11/16/2020 at [facility] office   PATIENT SURVEYS:  Intake: FOTO 91%, predicted  87% Status 02/21/21:      COGNITION:          Overall cognitive status: Within functional limits for tasks assessed                POSTURE:  Forward head   Palpation:  TPP along bil posterior calcaneal tubercle with  L>R, and distal achilles tendion bil Multiple triggers points noted in bil calves       LE AROM/PROM:   A/PROM Right 01/27/2021 Left 01/27/2021  Hip flexion      Hip extension      Hip abduction      Hip adduction      Hip internal rotation      Hip external rotation      Knee flexion      Knee extension      Ankle dorsiflexion WNL WNL  Ankle plantarflexion WNL WNL  Ankle inversion WNL WNL  Ankle eversion WNL WNL   (Blank rows = not tested)   LE MMT:   MMT Right 01/27/2021 Left 01/27/2021 Right  02/22/21 Left  02/22/21  Hip flexion        Hip extension        Hip abduction        Hip adduction        Hip internal rotation        Hip external rotation        Knee flexion        Knee extension        Ankle dorsiflexion 5/5 5/5 5/5 5/5  Ankle plantarflexion 5/5 5/5 5/5 5/5  Ankle inversion 4/5 4/5 4+/5 4+/5  Ankle eversion  4/5 4/5 4+/5 4+/5   (Blank rows = not tested)   LOWER EXTREMITY SPECIAL TESTS:  Ankle special tests: Anterior drawer test: negative, Talar tilt test: negative, and Thompson's test: negative   JOINT MOBILITY ASSESSMENT:  WNL     GAIT: Distance walked: N/A Assistive device utilized: None Level of assistance: Complete Independence Comments: No major deviations noted  Today's Treatment: 02/22/21   Therapeutic Exercise:    Rec bike L 2 x 5 min  Gastroc/ soleus stretch 2 x 30 sec ea Eccentric heel raise alternating x 10 each SLS on airex 60 sec each - no UE  SLS with ABCs using black small exercise ball , bilat Rebounder SLS toss red ball from foam oval - 16 toss right, 15 toss left  Slant board gastroc and soleus stretch x 1 min each Verbal review of entire HEP with addition of dynamic SLS and SLS on pillow  Treatment 02/15/21 Therapeutic Exercise:  Rec bike L 2 x 5 min  Gastroc/ soleus stretch 2 x 30 sec ea Heel raise to toe raise x 10 x 2 Eccentric heel raise alternating x 10 each SLS on airex 60 sec each - no UE  Rebounder  SLS toss red ball x 20 tosses each  Rebounder SLS toss red ball from foam oval - 7 toss right, 5 toss left  Slant board gastroc and soleus stretch x 1 min each Green band inv ev x 15 each bilat  Manual Therapy:   Posterior to anterior fibular head mobs  Cross friction massage to heel  Passive gastroc and soleus stretches prone Calcaneal distraction with passive DF  Treatment 02/09/21 Therapeutic Exercise: Gastroc/ soleus stretch 2 x 30 sec ea Heel raise to toe raise x 10 Tandem stance, added airex x 30 sec each foot back Slant board gastroc and soleus stretch x 1 min each Red band inv ev x 1 5 each bilat  Manual Therapy:   Posterior fibular head mobs into DF Cross friction massage to heel  Passive gastroc and soleus stretches prone    [facility] Adult PT Treatment:     02/02/2021 Therapeutic Exercise: Gastroc/ soleus stretch 1 x 30 sec  ea Ankle  circles Manual Therapy:  MTPR along the gasroc/ soleus bil Posterior fibular head mobs into DF until pain free STM to calcaneal fibular ligament Mobs into eversion             PATIENT EDUCATION:  Education details: reviewed HEP Person educated: Patient Education method: Explanation Education comprehension: verbalized understanding     HOME EXERCISE PROGRAM: Access Code: [email or web address] URL: [email or web address] Date: 02/22/2021 Prepared by: Hessie Diener  Exercises Soleus Stretch on Wall - 1 x daily - 7 x weekly - 2 sets - 2 reps - 30 seconds hold Gastroc Stretch on Wall - 1 x daily - 7 x weekly - 2 sets - 2 reps - 30 seconds hold Towel Scrunches - 1 x daily - 7 x weekly - 2 sets - 10 reps Seated Ankle Eversion with Resistance - 1 x daily - 7 x weekly - 2 sets - 10 reps Ankle Inversion with Anchored Resistance at Table - 1 x daily - 7 x weekly - 2 sets - 10 reps Seated Ankle Circles - 2 x daily - 7 x weekly - 2 sets - 10 reps Standing Heel Raises - 1 x daily - 7 x weekly - 2 sets - 10 reps Single Leg Stance on Pillow - 1 x daily - 7 x weekly - 1 sets - 3 reps Single Leg Stance - 1 x daily - 7 x weekly - 1 sets - 3 reps     ASSESSMENT:   CLINICAL IMPRESSION: Pt reports overall improvement in calf tightness. She does still have left heel pain that is aggravated with touching it or prolonged activity in her feet but at the most it is 1-2/10 on the pain scale. Continued with stability in unlevel and dynamic positions and she tolerated this well without increased pain. She requests to discharge to HEP today. Her FOTO score improved beyond prediction, Her ankle MMT  improved to target. She has met or partially met all LTGS. Will discharge to HEP.    REHAB POTENTIAL: Good   CLINICAL DECISION MAKING: Stable/uncomplicated   EVALUATION COMPLEXITY: Low     GOALS: Goals reviewed with patient? Yes   SHORT TERM GOALS:   STG Name Target Date Goal status  1 Pt to be  IND with initial HEP Baseline:  02/17/2021 MET 02/22/21    LONG TERM GOALS:    LTG Name Target Date Goal status  1 To reduce bil calf muscle tension to reduce achilles tension/ pain with out the need for anti-inflammatories Baseline: status: pain 1-2/10 left heel without NSAIDS 03/10/2021 Partially MET 02/22/21  2 Increase bil ankle inversion/ eversion strength to >/= 4+/5 Baseline: 03/10/2021 MET 02/22/21  3 Increase FOTO score by atleast 1% to demo improvement in function Baseline: 03/10/2021 MET 02/22/21  4 Pt to be IND with all HEP and is able to maintain and progress current LOF Baseline: 03/10/2021 MET 02/22/21      PLAN: PT FREQUENCY: 1x/week   PT DURATION: 6 weeks   PLANNED INTERVENTIONS: Therapeutic exercises, Therapeutic activity, Neuro Muscular re-education, Balance training, Gait training, Patient/Family education, Joint mobilization, Stair training, Dry Needling, Taping, Ultrasound, and Manual therapy   PLAN FOR NEXT SESSION:      N/A  Discharge to HEP today.     Dorene Ar PTA 02/22/2021, 12:40 PM       PHYSICAL THERAPY DISCHARGE SUMMARY  Visits from Start of Care: 5  Current functional level related to goals / functional outcomes: See goals, FOTO 91%  Remaining deficits: See assessment   Education / Equipment: HEP, theraband, posture, lifting mechanics   Patient agrees to discharge. Patient goals were met. Patient is being discharged due to being pleased with the current functional level.  Kristoffer Leamon PT, DPT, LAT, ATC  02/22/21  12:55 PM

## 2021-03-09 ENCOUNTER — Encounter: Payer: Self-pay | Admitting: Gastroenterology

## 2021-03-31 ENCOUNTER — Other Ambulatory Visit: Payer: Self-pay | Admitting: Obstetrics and Gynecology

## 2021-03-31 DIAGNOSIS — Z1231 Encounter for screening mammogram for malignant neoplasm of breast: Secondary | ICD-10-CM

## 2021-04-01 ENCOUNTER — Other Ambulatory Visit: Payer: Self-pay

## 2021-04-01 ENCOUNTER — Ambulatory Visit
Admission: RE | Admit: 2021-04-01 | Discharge: 2021-04-01 | Disposition: A | Discharge status: Home / Self Care | Payer: 59 | Source: Ambulatory Visit | Attending: Obstetrics and Gynecology | Admitting: Obstetrics and Gynecology

## 2021-04-01 DIAGNOSIS — Z1231 Encounter for screening mammogram for malignant neoplasm of breast: Secondary | ICD-10-CM

## 2021-04-05 ENCOUNTER — Encounter: Payer: Self-pay | Admitting: Gastroenterology

## 2021-04-18 ENCOUNTER — Ambulatory Visit (AMBULATORY_SURGERY_CENTER): Payer: 59 | Admitting: *Deleted

## 2021-04-18 ENCOUNTER — Other Ambulatory Visit: Payer: Self-pay

## 2021-04-18 VITALS — Ht 66.0 in | Wt 180.0 lb

## 2021-04-18 DIAGNOSIS — Z1211 Encounter for screening for malignant neoplasm of colon: Secondary | ICD-10-CM

## 2021-04-18 DIAGNOSIS — Z800 Family history of malignant neoplasm of digestive organs: Secondary | ICD-10-CM

## 2021-04-18 MED ORDER — NA SULFATE-K SULFATE-MG SULF 17.5-3.13-1.6 GM/177ML PO SOLN: 2.0000 | Freq: Once | ORAL | 0 refills | Status: AC

## 2021-04-18 NOTE — Progress Notes (Signed)

## 2021-05-05 ENCOUNTER — Encounter: Payer: Self-pay | Admitting: Gastroenterology

## 2021-05-09 ENCOUNTER — Ambulatory Visit (AMBULATORY_SURGERY_CENTER): Payer: 59 | Admitting: Gastroenterology

## 2021-05-09 ENCOUNTER — Other Ambulatory Visit: Payer: Self-pay

## 2021-05-09 ENCOUNTER — Encounter: Payer: Self-pay | Admitting: Gastroenterology

## 2021-05-09 VITALS — BP 102/63 | HR 55 | Temp 99.3°F | Resp 15 | Ht 64.5 in | Wt 180.0 lb

## 2021-05-09 DIAGNOSIS — Z1211 Encounter for screening for malignant neoplasm of colon: Secondary | ICD-10-CM

## 2021-05-09 DIAGNOSIS — D120 Benign neoplasm of cecum: Secondary | ICD-10-CM

## 2021-05-09 DIAGNOSIS — Z8371 Family history of colonic polyps: Secondary | ICD-10-CM

## 2021-05-09 MED ORDER — SODIUM CHLORIDE 0.9 % IV SOLN: 500.0000 mL | Freq: Once | INTRAVENOUS | Status: DC

## 2021-05-09 NOTE — Progress Notes (Signed)
? ?History & Physical ? ?Primary Care Physician:  Binnie Rail, MD ?Primary Gastroenterologist: Lucio Edward, MD ? ?CHIEF COMPLAINT:  Family history of colon polyps, screening  ? ?HPI: Whitney Blake is a 61 y.o. female for with a family history of colon polyps, 2 first-degree relatives, for colonoscopy. ? ? ?Past Medical History:  ?Diagnosis Date  ? Anemia   ? PMH of  ? Cervical spinal stenosis   ? Dr Arnoldo Morale, [place]  ? Hypertension   ? Internal hemorrhoids   ? Migraines   ? menstrual  ? Nonspecific ST-T changes 02/13/2005  ? Normal nuclear stress test 02/13/2005  ? ? ?Past Surgical History:  ?Procedure Laterality Date  ? cesearean   1989  ? COLONOSCOPY  02/13/2010  ? neg; [facility]  ? CONCUSSION    ? DUE TO FALL AT AGE 17  ? FOOT SURGERY    ? Right   ? LUMBAR LAMINECTOMY  02/13/2001  ? FOR RUPTURED DISC  ? ORIF CLAVICULAR FRACTURE    ? bilaterally @ age 23  ? TONSILLECTOMY AND ADENOIDECTOMY    ? ? ?Prior to Admission medications   ?Medication Sig Start Date End Date Taking? Authorizing Provider  ?cholecalciferol (VITAMIN D) 1000 units tablet Take 2,000 Units by mouth daily.   Yes [provider]  ?erythromycin ophthalmic ointment erythromycin 5 mg/gram (0.5 %) eye ointment ? APPLY INTO THE RIGHT EYE AT BEDTIME AS NEEDED   Yes [provider]  ?Multiple Vitamin (MULTIVITAMIN WITH MINERALS) TABS tablet Take 1 tablet by mouth daily.   Yes [provider]  ?telmisartan-hydrochlorothiazide (MICARDIS HCT) 40-12.5 MG tablet Take 1 tablet by mouth daily. 09/13/20  Yes Binnie Rail, MD  ?clobetasol ointment (TEMOVATE) 0.05 % SMARTSIG:Sparingly Topical Twice Daily PRN 02/18/20   [provider]  ?meloxicam (MOBIC) 15 MG tablet Take 1 tablet (15 mg total) by mouth daily. 12/28/20   Lorenda Peck, MD  ? ? ?Current Outpatient Medications  ?Medication Sig Dispense Refill  ? cholecalciferol (VITAMIN D) 1000 units tablet Take 2,000 Units by mouth daily.    ? erythromycin ophthalmic ointment  erythromycin 5 mg/gram (0.5 %) eye ointment ? APPLY INTO THE RIGHT EYE AT BEDTIME AS NEEDED    ? Multiple Vitamin (MULTIVITAMIN WITH MINERALS) TABS tablet Take 1 tablet by mouth daily.    ? telmisartan-hydrochlorothiazide (MICARDIS HCT) 40-12.5 MG tablet Take 1 tablet by mouth daily. 90 tablet 3  ? clobetasol ointment (TEMOVATE) 0.05 % SMARTSIG:Sparingly Topical Twice Daily PRN    ? meloxicam (MOBIC) 15 MG tablet Take 1 tablet (15 mg total) by mouth daily. 30 tablet 0  ? ?Current Facility-Administered Medications  ?Medication Dose Route Frequency Provider Last Rate Last Admin  ? 0.9 %  sodium chloride infusion  500 mL Intravenous Once Ladene Artist, MD      ? ? ?Allergies as of 05/09/2021 - Review Complete 05/09/2021  ?Allergen Reaction Noted  ? Rofecoxib Swelling   ? Coreg [carvedilol] Other (See Comments) 03/18/2013  ? ? ?Family History  ?Problem Relation Age of Onset  ? Hypertension Mother   ?     bilateral endarterectomies,pacer,mesenteric artery bypass, renal artery stenosis stented unilaterally, intestinal perforation, steroids for colitis induced osteoprosis, polyps   ? Cancer Mother   ?     uterine S/P Tamoxifen  ? Transient ischemic attack Mother   ?     in early 6's  ? Colon polyps Mother   ? Irritable bowel syndrome Mother   ? Breast cancer  Mother   ? Heart disease Mother   ?     pacer  ? Asthma Father   ? Hypertension Father   ? Colon polyps Father   ? Heart disease Father   ?     S/P ablation for AF  ? Stroke Father   ?     cns bleed on coumadin  ? Hypertension Sister   ?     2 sisters  ? Cancer Sister   ?     cervical  ? Cancer Maternal Grandmother   ?     uterine  ? Diabetes Maternal Grandmother   ? Colon cancer Neg Hx   ? Esophageal cancer Neg Hx   ? Stomach cancer Neg Hx   ? Rectal cancer Neg Hx   ? ? ?Social History  ? ?Socioeconomic History  ? Marital status: Married  ?  Spouse name: Not on file  ? Number of children: 2  ? Years of education: Not on file  ? Highest education level: Not on file   ?Occupational History  ? Occupation: Dance movement psychotherapist  ?  Employer: [employer]  ?Tobacco Use  ? Smoking status: Never  ? Smokeless tobacco: Never  ?Vaping Use  ? Vaping Use: Never used  ?Substance and Sexual Activity  ? Alcohol use: No  ? Drug use: No  ? Sexual activity: Not on file  ?Other Topics Concern  ? Not on file  ?Social History Narrative  ? Exercises 3 a week   ? Daily caffeine   ? ?Social Determinants of Health  ? ?Financial Resource Strain: Not on file  ?Food Insecurity: Not on file  ?Transportation Needs: Not on file  ?Physical Activity: Not on file  ?Stress: Not on file  ?Social Connections: Not on file  ?Intimate Partner Violence: Not on file  ? ? ?Review of Systems: ? ?All systems reviewed an negative except where noted in HPI. ? ?Gen: Denies any fever, chills, sweats, anorexia, fatigue, weakness, malaise, weight loss, and sleep disorder ?CV: Denies chest pain, angina, palpitations, syncope, orthopnea, PND, peripheral edema, and claudication. ?Resp: Denies dyspnea at rest, dyspnea with exercise, cough, sputum, wheezing, coughing up blood, and pleurisy. ?GI: Denies vomiting blood, jaundice, and fecal incontinence.   Denies dysphagia or odynophagia. ?GU : Denies urinary burning, blood in urine, urinary frequency, urinary hesitancy, nocturnal urination, and urinary incontinence. ?MS: Denies joint pain, limitation of movement, and swelling, stiffness, low back pain, extremity pain. Denies muscle weakness, cramps, atrophy.  ?Derm: Denies rash, itching, dry skin, hives, moles, warts, or unhealing ulcers.  ?Psych: Denies depression, anxiety, memory loss, suicidal ideation, hallucinations, paranoia, and confusion. ?Heme: Denies bruising, bleeding, and enlarged lymph nodes. ?Neuro:  Denies any headaches, dizziness, paresthesias. ?Endo:  Denies any problems with DM, thyroid, adrenal function. ? ? ?Physical Exam: ?General:  Alert, well-developed, in NAD ?Head:  Normocephalic and atraumatic. ?Eyes:  Sclera  clear, no icterus.   Conjunctiva pink. ?Ears:  Normal auditory acuity. ?Mouth:  No deformity or lesions.  ?Neck:  Supple; no masses . ?Lungs:  Clear throughout to auscultation.   No wheezes, crackles, or rhonchi. No acute distress. ?Heart:  Regular rate and rhythm; no murmurs. ?Abdomen:  Soft, nondistended, nontender. No masses, hepatomegaly. No obvious masses.  Normal bowel .    ?Rectal:  Deferred to colonoscopy  ?Msk:  Symmetrical without gross deformities.[name] ?Pulses:  Normal pulses noted. ?Extremities:  Without edema. ?Neurologic:  Alert and  oriented x4;  grossly normal neurologically. ?Skin:  Intact without significant lesions  or rashes. ?Cervical Nodes:  No significant cervical adenopathy. ?Psych:  Alert and cooperative. Normal mood and affect. ? ? ?Impression / Plan:  ? ?Family history of colon polyps, 2 first-degree relatives, for colonoscopy. ? ?Stanlee Roehrig T. Fuller Plan  05/09/2021, 1:54 PM ?See Shea Evans, [facility], to contact our on call provider ? ? ?  ?

## 2021-05-09 NOTE — Op Note (Signed)
[place] ?Patient Name: Whitney Blake ?Procedure Date: 05/09/2021 1:58 PM ?MRN: [id number] ?Endoscopist: Ladene Artist , MD ?Age: 61 ?Referring MD:  ?Date of Birth: 02-Jun-1960 ?Gender: Female ?Account #: [id number] ?Procedure:                Colonoscopy ?Indications:              Colon cancer screening in patient at increased  ?                          risk: Family history of colon polyps in multiple  ?                          1st-degree relatives ?Medicines:                Monitored Anesthesia Care ?Procedure:                Pre-Anesthesia Assessment: ?                          - Prior to the procedure, a History and Physical  ?                          was performed, and patient medications and  ?                          allergies were reviewed. The patient's tolerance of  ?                          previous anesthesia was also reviewed. The risks  ?                          and benefits of the procedure and the sedation  ?                          options and risks were discussed with the patient.  ?                          All questions were answered, and informed consent  ?                          was obtained. Prior Anticoagulants: The patient has  ?                          taken no previous anticoagulant or antiplatelet  ?                          agents. ASA Grade Assessment: II - A patient with  ?                          mild systemic disease. After reviewing the risks  ?                          and benefits, the patient was deemed in  ?  satisfactory condition to undergo the procedure. ?                          After obtaining informed consent, the colonoscope  ?                          was passed under direct vision. Throughout the  ?                          procedure, the patient's blood pressure, pulse, and  ?                          oxygen saturations were monitored continuously. The  ?                          PCF-HQ190L Colonoscope was introduced through  the  ?                          anus and advanced to the the cecum, identified by  ?                          appendiceal orifice and ileocecal valve. The  ?                          ileocecal valve, appendiceal orifice, and rectum  ?                          were photographed. The quality of the bowel  ?                          preparation was excellent. The colonoscopy was  ?                          performed without difficulty. The patient tolerated  ?                          the procedure well. ?Scope In: 2:07:04 PM ?Scope Out: 2:19:01 PM ?Scope Withdrawal Time: 0 hours 9 minutes 29 seconds  ?Total Procedure Duration: 0 hours 11 minutes 57 seconds  ?Findings:                 The perianal and digital rectal examinations were  ?                          normal. ?                          A 7 mm polyp was found in the cecum. The polyp was  ?                          sessile. The polyp was removed with a cold snare.  ?                          Resection and retrieval were complete. ?  A few small-mouthed diverticula were found in the  ?                          left colon. ?                          Internal hemorrhoids were found during  ?                          retroflexion. The hemorrhoids were small and Grade  ?                          I (internal hemorrhoids that do not prolapse). ?                          The exam was otherwise without abnormality on  ?                          direct and retroflexion views. ?Complications:            No immediate complications. Estimated blood loss:  ?                          None. ?Estimated Blood Loss:     Estimated blood loss: none. ?Impression:               - One 7 mm polyp in the cecum, removed with a cold  ?                          snare. Resected and retrieved. ?                          - Mild diverticulosis in the left colon. ?                          - Internal hemorrhoids. ?                          - The examination was otherwise  normal on direct  ?                          and retroflexion views. ?Recommendation:           - Repeat colonoscopy, likely 5 years, after studies  ?                          are complete for surveillance based on pathology  ?                          results. ?                          - Patient has a contact number available for  ?                          emergencies. The signs and symptoms of potential  ?  delayed complications were discussed with the  ?                          patient. Return to normal activities tomorrow.  ?                          Written discharge instructions were provided to the  ?                          patient. ?                          - Resume previous diet. ?                          - Continue present medications. ?                          - Await pathology results. ?Ladene Artist, MD ?05/09/2021 2:23:43 PM ?This report has been signed electronically. ?

## 2021-05-09 NOTE — Progress Notes (Signed)
Called to room to assist during endoscopic procedure.  Patient ID and intended procedure confirmed with present staff. Received instructions for my participation in the procedure from the performing physician.  

## 2021-05-09 NOTE — Progress Notes (Signed)
Report to PACU, RN, vss, BBS= Clear.  

## 2021-05-09 NOTE — Patient Instructions (Signed)
YOU HAD AN ENDOSCOPIC PROCEDURE TODAY AT [facility]:   Refer to the procedure report that was given to you for any specific questions about what was found during the examination.  If the procedure report does not answer your questions, please call your gastroenterologist to clarify.  If you requested that your care partner not be given the details of your procedure findings, then the procedure report has been included in a sealed envelope for you to review at your convenience later. ? ?**Handout given on polyps, hemorrhoids, diverticulosis and a high fiber diet** ? ?YOU SHOULD EXPECT: Some feelings of bloating in the abdomen. Passage of more gas than usual.  Walking can help get rid of the air that was put into your GI tract during the procedure and reduce the bloating. If you had a lower endoscopy (such as a colonoscopy or flexible sigmoidoscopy) you may notice spotting of blood in your stool or on the toilet paper. If you underwent a bowel prep for your procedure, you may not have a normal bowel movement for a few days. ? ?Please Note:  You might notice some irritation and congestion in your nose or some drainage.  This is from the oxygen used during your procedure.  There is no need for concern and it should clear up in a day or so. ? ?SYMPTOMS TO REPORT IMMEDIATELY: ? ?Following lower endoscopy (colonoscopy or flexible sigmoidoscopy): ? Excessive amounts of blood in the stool ? Significant tenderness or worsening of abdominal pains ? Swelling of the abdomen that is new, acute ? Fever of 100?F or higher ? ? ?For urgent or emergent issues, a gastroenterologist can be reached at any hour by calling [phone]. ?Do not use MyChart messaging for urgent concerns.  ? ? ?DIET:  We do recommend a small meal at first, but then you may proceed to your regular diet.  Drink plenty of fluids but you should avoid alcoholic beverages for 24 hours. ? ?ACTIVITY:  You should plan to take it easy for the rest  of today and you should NOT DRIVE or use heavy machinery until tomorrow (because of the sedation medicines used during the test).   ? ?FOLLOW UP: ?Our staff will call the number listed on your records 48-72 hours following your procedure to check on you and address any questions or concerns that you may have regarding the information given to you following your procedure. If we do not reach you, we will leave a message.  We will attempt to reach you two times.  During this call, we will ask if you have developed any symptoms of COVID 19. If you develop any symptoms (ie: fever, flu-like symptoms, shortness of breath, cough etc.) before then, please call [phone].  If you test positive for Covid 19 in the 2 weeks post procedure, please call and report this information to [name].   ? ?If any biopsies were taken you will be contacted by phone or by letter within the next 1-3 weeks.  Please call us at [phone] if you have not heard about the biopsies in 3 weeks.  ? ? ?SIGNATURES/CONFIDENTIALITY: ?You and/or your care partner have signed paperwork which will be entered into your electronic medical record.  These signatures attest to the fact that that the information above on your After Visit Summary has been reviewed and is understood.  Full responsibility of the confidentiality of this discharge information lies with you and/or your care-partner.  ?

## 2021-05-11 ENCOUNTER — Telehealth: Payer: Self-pay

## 2021-05-11 NOTE — Telephone Encounter (Signed)
Called # [phone] and left a message we tried to reach pt for a follow up call. maw  ?

## 2021-05-16 ENCOUNTER — Encounter: Payer: Self-pay | Admitting: Gastroenterology

## 2021-06-08 ENCOUNTER — Other Ambulatory Visit: Payer: Self-pay | Admitting: Podiatry

## 2021-06-08 ENCOUNTER — Telehealth: Payer: Self-pay | Admitting: Podiatry

## 2021-06-08 DIAGNOSIS — M7661 Achilles tendinitis, right leg: Secondary | ICD-10-CM

## 2021-06-08 NOTE — Telephone Encounter (Signed)
Left message on voice mail of Dr. Erasmo Downer response.  ?

## 2021-06-08 NOTE — Telephone Encounter (Signed)
I can go ahead and order MRI, she will need to call back to be schedule for Review of MRI after MRI is scheduled. Thanks  ?

## 2021-06-08 NOTE — Telephone Encounter (Signed)
Pt states she is still having heel pain in her Left foot with burning and stabbing pain. She states rehab didn't help her issue and that it was discussed at her last appt that if rehab doesn't work that Dr. Blenda Mounts would order an MRI. Pt last seen 12/2020. Advised to schedule appointment to be seen, pt declined appt and wanted to ask if MRI can just be ordered without coming in first.  ? ?Please advise. ?

## 2021-06-09 ENCOUNTER — Telehealth: Payer: Self-pay | Admitting: *Deleted

## 2021-06-09 NOTE — Telephone Encounter (Signed)
Patient calling to find out if imaging will contact to schedule MRI. ?Returned call back to explain that [facility] will contact her to schedule appointment. ?

## 2021-06-22 ENCOUNTER — Ambulatory Visit
Admission: RE | Admit: 2021-06-22 | Discharge: 2021-06-22 | Disposition: A | Discharge status: Home / Self Care | Payer: 59 | Source: Ambulatory Visit | Attending: Podiatry | Admitting: Podiatry

## 2021-06-22 DIAGNOSIS — M7661 Achilles tendinitis, right leg: Secondary | ICD-10-CM

## 2021-07-05 ENCOUNTER — Ambulatory Visit: Payer: 59 | Admitting: Podiatry

## 2021-07-05 ENCOUNTER — Encounter: Payer: Self-pay | Admitting: Podiatry

## 2021-07-05 DIAGNOSIS — M7661 Achilles tendinitis, right leg: Secondary | ICD-10-CM | POA: Diagnosis not present

## 2021-07-05 DIAGNOSIS — M7662 Achilles tendinitis, left leg: Secondary | ICD-10-CM | POA: Diagnosis not present

## 2021-07-05 MED ORDER — MELOXICAM 15 MG PO TABS: 15.0000 mg | ORAL_TABLET | Freq: Every day | ORAL | 0 refills | Status: DC

## 2021-07-05 NOTE — Progress Notes (Signed)
  Subjective:  Patient ID: Whitney Blake, female    DOB: 05/06/1960,   MRN: [id number]  Chief Complaint  Patient presents with   Consult    Mri Review      61 y.o. female presents for follow-up of bilateral achilles tendonitis. Here to review MRI.   [name] Has been using the heel lifts. Meloxicam helped. Has been stretching. Relates after she finished the meloxicam the pain has started to come back. States when wearing socks she gets a burning tingling pain on the left heel.    . Denies any other pedal complaints. Denies n/v/f/c.   Past Medical History:  Diagnosis Date   Anemia    PMH of   Cervical spinal stenosis    Dr Arnoldo Morale, [place]   Hypertension    Internal hemorrhoids    Migraines    menstrual   Nonspecific ST-T changes 02/13/2005   Normal nuclear stress test 02/13/2005    Objective:  Physical Exam: Vascular: DP/PT pulses 2/4 bilateral. CFT <3 seconds. Normal hair growth on digits. No edema.  Skin. No lacerations or abrasions bilateral feet.  Musculoskeletal: MMT 5/5 bilateral lower extremities in DF, PF, Inversion and Eversion. Deceased ROM in DF of ankle joint. Tender to insertion of achilles bilateral L>R. No pain with ROM. No pain proximally along tendon.  Neurological: Sensation intact to light touch.    MRI left heel  IMPRESSION: 1. 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 No partial or full-thickness tear. 2. Mild Haglund's deformity noted. 3. Moderate hindfoot varus and pes cavus. 4. Intact medial and lateral ankle ligaments and tendons.  Assessment:   1. Achilles tendonitis, bilateral       Plan:  Patient was evaluated and treated and all questions answered. X-rays reviewed and discussed with patient. No acute fractures or dislocations noted. Mild spurring noted to posterior calcaneus. Mild pes cavus noted.  -MRI reviewed and discussed with patient.  -Discussed Achilles insertional tendonitis and treatment options with patient.  -Rx  Meloxicam refilled.  -Continue with heel lifts.  -Discussed if no improvement PRP injections vs surgical options.  Discussed in details surgery in detail and patient will go home to discuss options and call back later with decision. Likely would not plan anything until after the summer.  -Patient to return to office as needed.     Lorenda Peck, DPM

## 2021-07-12 ENCOUNTER — Ambulatory Visit: Payer: 59 | Admitting: Podiatry

## 2021-07-26 ENCOUNTER — Other Ambulatory Visit: Payer: Self-pay

## 2021-07-26 ENCOUNTER — Telehealth: Payer: Self-pay | Admitting: Internal Medicine

## 2021-07-26 MED ORDER — TELMISARTAN-HCTZ 40-12.5 MG PO TABS: 1.0000 | ORAL_TABLET | Freq: Every day | ORAL | 0 refills | Status: DC

## 2021-07-26 NOTE — Telephone Encounter (Signed)
Pt.called in and stated that she is out of town and needed a short supply of telmisartan-hydrochlorothiazide (MICARDIS HCT) 40-12.5 MG tablet.  [id number] [place]  [phone] Fax:  [phone]

## 2021-07-26 NOTE — Telephone Encounter (Signed)
Sent today

## 2021-08-19 ENCOUNTER — Other Ambulatory Visit: Payer: Self-pay | Admitting: Podiatry

## 2021-09-22 ENCOUNTER — Other Ambulatory Visit: Payer: Self-pay | Admitting: Internal Medicine

## 2021-10-25 ENCOUNTER — Other Ambulatory Visit: Payer: Self-pay | Admitting: Internal Medicine

## 2021-10-26 ENCOUNTER — Encounter: Payer: Self-pay | Admitting: Internal Medicine

## 2021-10-26 NOTE — Progress Notes (Signed)
Subjective:    Patient ID: Whitney Blake, female    DOB: December 07, 1960, 61 y.o.   MRN: [id number]      HPI Whitney Blake is here for a Physical exam.    Still has left lower Achilles pain.   Saw podiatry -  was told she has a bone deformity that is cutting her tendon. She was told the treatment was surgery which has a long recovery which she is not sure she wants to do.  She is unsure if this is something that is inevitable or not  Still has issues with her smell and taste.    Medications and allergies reviewed with patient and updated if appropriate.  Current Outpatient Medications on File Prior to Visit  Medication Sig Dispense Refill   cholecalciferol (VITAMIN D) 1000 units tablet Take 2,000 Units by mouth daily.     clobetasol ointment (TEMOVATE) 0.05 % SMARTSIG:Sparingly Topical Twice Daily PRN     Multiple Vitamin (MULTIVITAMIN WITH MINERALS) TABS tablet Take 1 tablet by mouth daily.     telmisartan-hydrochlorothiazide (MICARDIS HCT) 40-12.5 MG tablet TAKE 1 TABLET BY MOUTH DAILY. PATIENT DUE FOR PHYSICAL. PLEASE CONTACT OFFICE. 30 tablet 0   No current facility-administered medications on file prior to visit.    Review of Systems  Constitutional:  Negative for fever.  HENT:  Positive for hearing loss.   Eyes:  Negative for visual disturbance.  Respiratory:  Negative for cough, shortness of breath and wheezing.   Cardiovascular:  Negative for chest pain, palpitations and leg swelling.  Gastrointestinal:  Positive for diarrhea (chronic). Negative for abdominal pain, blood in stool, constipation and nausea.       Occ gerd  Genitourinary:  Negative for dysuria.  Musculoskeletal:  Positive for arthralgias. Negative for back pain.       Achilles  Skin:  Negative for rash.       ? BCC on forehead - awaiting derm appt  Neurological:  Negative for light-headedness and headaches.  Psychiatric/Behavioral:  Negative for dysphoric mood. The patient is not nervous/anxious.         Objective:   Vitals:   10/27/21 0849  BP: 132/82  Pulse: 70  Temp: 98.2 F (36.8 C)  SpO2: 96%   Filed Weights   10/27/21 0849  Weight: 182 lb (82.6 kg)   Body mass index is 30.76 kg/m.  BP Readings from Last 3 Encounters:  10/27/21 132/82  05/09/21 102/63  09/13/20 140/78    Wt Readings from Last 3 Encounters:  10/27/21 182 lb (82.6 kg)  05/09/21 180 lb (81.6 kg)  04/18/21 180 lb (81.6 kg)       Physical Exam Constitutional: She appears well-developed and well-nourished. No distress.  HENT:  Head: Normocephalic and atraumatic.  Right Ear: External ear normal. Normal ear canal and TM Left Ear: External ear normal.  Normal ear canal and TM Mouth/Throat: Oropharynx is clear and moist.  Eyes: Conjunctivae normal.  Neck: Neck supple. No tracheal deviation present. No thyromegaly present.  No carotid bruit  Cardiovascular: Normal rate, regular rhythm and normal heart sounds.   No murmur heard.  No edema. Pulmonary/Chest: Effort normal and breath sounds normal. No respiratory distress. She has no wheezes. She has no rales.  Breast: deferred   Abdominal: Soft. She exhibits no distension. There is no tenderness.  Lymphadenopathy: She has no cervical adenopathy.  Skin: Skin is warm and dry. She is not diaphoretic.  Psychiatric: She has a normal mood and affect. Her behavior is  normal.     Lab Results  Component Value Date   WBC 8.3 09/13/2020   HGB 13.7 09/13/2020   HCT 41.8 09/13/2020   PLT 258.0 09/13/2020   GLUCOSE 76 09/13/2020   CHOL 216 (H) 09/13/2020   TRIG 138.0 09/13/2020   HDL 62.90 09/13/2020   LDLCALC 126 (H) 09/13/2020   ALT 25 09/13/2020   AST 20 09/13/2020   NA 138 09/13/2020   K 3.8 09/13/2020   CL 101 09/13/2020   CREATININE 0.93 09/13/2020   BUN 14 09/13/2020   CO2 29 09/13/2020   TSH 3.41 09/13/2020   HGBA1C 5.9 09/13/2020         Assessment & Plan:   Physical exam: Screening blood work  ordered Exercise  Bo-flex Weight   encouraged weight loss Substance abuse  none   Reviewed recommended immunizations.   Health Maintenance  Topic Date Due   Zoster Vaccines- Shingrix (1 of 2) Never done   DEXA SCAN  05/18/2020   INFLUENZA VACCINE  09/13/2021   COVID-19 Vaccine (4 - Pfizer risk series) 11/12/2021 (Originally 02/20/2020)   MAMMOGRAM  04/02/2023   PAP SMEAR-Modifier  09/20/2025   COLONOSCOPY (Pts 45-59yr Insurance coverage will need to be confirmed)  05/10/2026   TETANUS/TDAP  07/01/2028   Hepatitis C Screening  Completed   HIV Screening  Completed   HPV VACCINES  Aged Out          See Problem List for Assessment and Plan of chronic medical problems.

## 2021-10-26 NOTE — Patient Instructions (Addendum)
Blood work was ordered.     Medications changes include :   none   Your prescription(s) have been sent to your pharmacy.    A bone density was ordered.    A referral was ordered for [facility].     Someone from that office will call you to schedule an appointment.    Return in about 1 year (around 10/28/2022) for Physical Exam.    Health Maintenance, Female Adopting a healthy lifestyle and getting preventive care are important in promoting health and wellness. Ask your health care provider about: The right schedule for you to have regular tests and exams. Things you can do on your own to prevent diseases and keep yourself healthy. What should I know about diet, weight, and exercise? Eat a healthy diet  Eat a diet that includes plenty of vegetables, fruits, low-fat dairy products, and lean protein. Do not eat a lot of foods that are high in solid fats, added sugars, or sodium. Maintain a healthy weight Body mass index (BMI) is used to identify weight problems. It estimates body fat based on height and weight. Your health care provider can help determine your BMI and help you achieve or maintain a healthy weight. Get regular exercise Get regular exercise. This is one of the most important things you can do for your health. Most adults should: Exercise for at least 150 minutes each week. The exercise should increase your heart rate and make you sweat (moderate-intensity exercise). Do strengthening exercises at least twice a week. This is in addition to the moderate-intensity exercise. Spend less time sitting. Even light physical activity can be beneficial. Watch cholesterol and blood lipids Have your blood tested for lipids and cholesterol at 62 years of age, then have this test every 5 years. Have your cholesterol levels checked more often if: Your lipid or cholesterol levels are high. You are older than 62 years of age. You are at high risk for heart  disease. What should I know about cancer screening? Depending on your health history and family history, you may need to have cancer screening at various ages. This may include screening for: Breast cancer. Cervical cancer. Colorectal cancer. Skin cancer. Lung cancer. What should I know about heart disease, diabetes, and high blood pressure? Blood pressure and heart disease High blood pressure causes heart disease and increases the risk of stroke. This is more likely to develop in people who have high blood pressure readings or are overweight. Have your blood pressure checked: Every 3-5 years if you are 84-83 years of age. Every year if you are 30 years old or older. Diabetes Have regular diabetes screenings. This checks your fasting blood sugar level. Have the screening done: Once every three years after age 34 if you are at a normal weight and have a low risk for diabetes. More often and at a younger age if you are overweight or have a high risk for diabetes. What should I know about preventing infection? Hepatitis B If you have a higher risk for hepatitis B, you should be screened for this virus. Talk with your health care provider to find out if you are at risk for hepatitis B infection. Hepatitis C Testing is recommended for: Everyone born from [birth year] through 1966. Anyone with known risk factors for hepatitis C. Sexually transmitted infections (STIs) Get screened for STIs, including gonorrhea and chlamydia, if: You are sexually active and are younger than 62 years of age. You are older  than 62 years of age and your health care provider tells you that you are at risk for this type of infection. Your sexual activity has changed since you were last screened, and you are at increased risk for chlamydia or gonorrhea. Ask your health care provider if you are at risk. Ask your health care provider about whether you are at high risk for HIV. Your health care provider may recommend a  prescription medicine to help prevent HIV infection. If you choose to take medicine to prevent HIV, you should first get tested for HIV. You should then be tested every 3 months for as long as you are taking the medicine. Pregnancy If you are about to stop having your period (premenopausal) and you may become pregnant, seek counseling before you get pregnant. Take 400 to 800 micrograms (mcg) of folic acid every day if you become pregnant. Ask for birth control (contraception) if you want to prevent pregnancy. Osteoporosis and menopause Osteoporosis is a disease in which the bones lose minerals and strength with aging. This can result in bone fractures. If you are 62 years old or older, or if you are at risk for osteoporosis and fractures, ask your health care provider if you should: Be screened for bone loss. Take a calcium or vitamin D supplement to lower your risk of fractures. Be given hormone replacement therapy (HRT) to treat symptoms of menopause. Follow these instructions at home: Alcohol use Do not drink alcohol if: Your health care provider tells you not to drink. You are pregnant, may be pregnant, or are planning to become pregnant. If you drink alcohol: Limit how much you have to: 0-1 drink a day. Know how much alcohol is in your drink. In the [place] one drink equals one 12 oz bottle of beer (355 mL), one 5 oz glass of wine (148 mL), or one 1 oz glass of hard liquor (44 mL). Lifestyle Do not use any products that contain nicotine or tobacco. These products include cigarettes, chewing tobacco, and vaping devices, such as e-cigarettes. If you need help quitting, ask your health care provider. Do not use street drugs. Do not share needles. Ask your health care provider for help if you need support or information about quitting drugs. General instructions Schedule regular health, dental, and eye exams. Stay current with your vaccines. Tell your health care provider if: You often  feel depressed. You have ever been abused or do not feel safe at home. Summary Adopting a healthy lifestyle and getting preventive care are important in promoting health and wellness. Follow your health care provider's instructions about healthy diet, exercising, and getting tested or screened for diseases. Follow your health care provider's instructions on monitoring your cholesterol and blood pressure. This information is not intended to replace advice given to you by your health care provider. Make sure you discuss any questions you have with your health care provider. Document Revised: 06/21/2020 Document Reviewed: 06/21/2020 Elsevier Patient Education  [place].

## 2021-10-27 ENCOUNTER — Ambulatory Visit (INDEPENDENT_AMBULATORY_CARE_PROVIDER_SITE_OTHER): Payer: 59 | Admitting: Internal Medicine

## 2021-10-27 VITALS — BP 132/82 | HR 70 | Temp 98.2°F | Ht 64.5 in | Wt 182.0 lb

## 2021-10-27 DIAGNOSIS — R7303 Prediabetes: Secondary | ICD-10-CM

## 2021-10-27 DIAGNOSIS — E559 Vitamin D deficiency, unspecified: Secondary | ICD-10-CM | POA: Diagnosis not present

## 2021-10-27 DIAGNOSIS — Z13820 Encounter for screening for osteoporosis: Secondary | ICD-10-CM

## 2021-10-27 DIAGNOSIS — I10 Essential (primary) hypertension: Secondary | ICD-10-CM | POA: Diagnosis not present

## 2021-10-27 DIAGNOSIS — Z0000 Encounter for general adult medical examination without abnormal findings: Secondary | ICD-10-CM | POA: Diagnosis not present

## 2021-10-27 DIAGNOSIS — E2839 Other primary ovarian failure: Secondary | ICD-10-CM

## 2021-10-27 DIAGNOSIS — M1812 Unilateral primary osteoarthritis of first carpometacarpal joint, left hand: Secondary | ICD-10-CM

## 2021-10-27 DIAGNOSIS — H9190 Unspecified hearing loss, unspecified ear: Secondary | ICD-10-CM

## 2021-10-27 DIAGNOSIS — M7660 Achilles tendinitis, unspecified leg: Secondary | ICD-10-CM

## 2021-10-27 LAB — COMPREHENSIVE METABOLIC PANEL
ALT: 30 U/L (ref 0–35)
AST: 22 U/L (ref 0–37)
Albumin: 4.1 g/dL (ref 3.5–5.2)
Alkaline Phosphatase: 55 U/L (ref 39–117)
BUN: 17 mg/dL (ref 6–23)
CO2: 29 mEq/L (ref 19–32)
Calcium: 9.4 mg/dL (ref 8.4–10.5)
Chloride: 102 mEq/L (ref 96–112)
Creatinine, Ser: 1.05 mg/dL (ref 0.40–1.20)
GFR: 57.04 mL/min — ABNORMAL LOW (ref >60.00)
Glucose, Bld: 89 mg/dL (ref 70–99)
Potassium: 3.9 mEq/L (ref 3.5–5.1)
Sodium: 139 mEq/L (ref 135–145)
Total Bilirubin: 0.5 mg/dL (ref 0.2–1.2)
Total Protein: 7.2 g/dL (ref 6.0–8.3)

## 2021-10-27 LAB — LIPID PANEL
Cholesterol: 176 mg/dL (ref 0–200)
HDL: 56.8 mg/dL (ref >39.00)
LDL Cholesterol: 94 mg/dL (ref 0–99)
NonHDL: 119.08
Total CHOL/HDL Ratio: 3
Triglycerides: 126 mg/dL (ref 0.0–149.0)
VLDL: 25.2 mg/dL (ref 0.0–40.0)

## 2021-10-27 LAB — CBC WITH DIFFERENTIAL/PLATELET
Basophils Absolute: 0 10*3/uL (ref 0.0–0.1)
Basophils Relative: 0.6 % (ref 0.0–3.0)
Eosinophils Absolute: 0.1 10*3/uL (ref 0.0–0.7)
Eosinophils Relative: 2 % (ref 0.0–5.0)
HCT: 41.1 % (ref 36.0–46.0)
Hemoglobin: 13.7 g/dL (ref 12.0–15.0)
Lymphocytes Relative: 47.9 % — ABNORMAL HIGH (ref 12.0–46.0)
Lymphs Abs: 3.3 10*3/uL (ref 0.7–4.0)
MCHC: 33.4 g/dL (ref 30.0–36.0)
MCV: 89.4 fl (ref 78.0–100.0)
Monocytes Absolute: 0.4 10*3/uL (ref 0.1–1.0)
Monocytes Relative: 6.4 % (ref 3.0–12.0)
Neutro Abs: 3 10*3/uL (ref 1.4–7.7)
Neutrophils Relative %: 43.1 % (ref 43.0–77.0)
Platelets: 236 10*3/uL (ref 150.0–400.0)
RBC: 4.6 Mil/uL (ref 3.87–5.11)
RDW: 13.8 % (ref 11.5–15.5)
WBC: 6.9 10*3/uL (ref 4.0–10.5)

## 2021-10-27 LAB — VITAMIN D 25 HYDROXY (VIT D DEFICIENCY, FRACTURES): VITD: 49.97 ng/mL (ref 30.00–100.00)

## 2021-10-27 LAB — HEMOGLOBIN A1C: Hgb A1c MFr Bld: 5.9 % (ref 4.6–6.5)

## 2021-10-27 LAB — TSH: TSH: 2.8 u[IU]/mL (ref 0.35–5.50)

## 2021-10-27 MED ORDER — TELMISARTAN-HCTZ 40-12.5 MG PO TABS: ORAL_TABLET | ORAL | 3 refills | Status: DC

## 2021-10-27 NOTE — Assessment & Plan Note (Signed)
Having achiness at left thumb joint-consistent with osteoarthritis Discussed treatment

## 2021-10-27 NOTE — Assessment & Plan Note (Signed)
Chronic Check a1c Low sugar / carb diet Stressed regular exercise  

## 2021-10-27 NOTE — Assessment & Plan Note (Signed)
Chronic Taking vitamin D daily Check vitamin D level  

## 2021-10-27 NOTE — Assessment & Plan Note (Signed)
Chronic Blood pressure well controlled CMP Continue telmisartan-HCT 40-12.5 mg daily

## 2021-10-27 NOTE — Assessment & Plan Note (Signed)
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

## 2021-11-01 ENCOUNTER — Ambulatory Visit: Payer: 59 | Attending: Audiologist | Admitting: Audiologist

## 2021-11-01 ENCOUNTER — Encounter: Payer: Self-pay | Admitting: Internal Medicine

## 2021-11-01 ENCOUNTER — Other Ambulatory Visit: Payer: Self-pay | Admitting: Internal Medicine

## 2021-11-01 DIAGNOSIS — H903 Sensorineural hearing loss, bilateral: Secondary | ICD-10-CM | POA: Diagnosis present

## 2021-11-01 DIAGNOSIS — H9190 Unspecified hearing loss, unspecified ear: Secondary | ICD-10-CM

## 2021-11-01 NOTE — Procedures (Signed)
  [facility] [place], [place]  [id number] [phone]  AUDIOLOGICAL  EVALUATION  NAME: Whitney Blake     DOB:   [birth year]      MRN: [id number]                                                                                     DATE: 11/01/2021     REFERENT: Binnie Rail, MD STATUS: Outpatient DIAGNOSIS: Sensorineural Hearing Loss    History: [name] was seen for an audiological evaluation. [name] is receiving a hearing evaluation due to concerns for difficulty hearing her husband and the loud TV volume. [name] knows she cannot hear low pitches well. [name] has difficulty hearing in most places, but especially in noise or when her husband is at a distance. This difficulty began gradually. No pain or pressure reported in either ear.  Tinnitus sounding like a woosh present in both ears. [name] has a history of ear infections and hearing loss as a child.  No medical risk factor for hearing loss. No other relevant case history reported.   Evaluation:  Otoscopy showed a clear view of the tympanic membranes, bilaterally Tympanometry results were consistent with normal middle ear function, bilaterally   Audiometric testing was completed using conventional audiometry with insert and supraural transducer. Speech Recognition Thresholds were 40dB in the right ear and 45dB in the left ear. Word Recognition was performed  40dB SL, scored  100% in the right ear and 76% in the left ear. 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 See below.   Results:  The test results were reviewed with [name]. [name] is a hearing aid candidate due to the moderate degree of sensorineural hearing loss in each ear. Due to configuration of hearing loss recommend evaluation with Otolaryngology to be thorough. [name] reported understanding all that was discussed.     Recommendations: Amplification is necessary for both ears.  Hearing aids can be purchased from a variety of locations. See provided list for locations in the [place] area.  Referral to ENT Physician necessary due to low pitched hearing loss and history of imbalance. Binnie Rail, MD recommend referral to Otolaryngology for medical review and clearance for hearing aids.    45 minutes spent testing and counseling on results.   Alfonse Alpers  Audiologist, Au.D., CCC-A 11/01/2021  8:57 AM  Cc: Binnie Rail, MD

## 2021-11-07 ENCOUNTER — Ambulatory Visit (INDEPENDENT_AMBULATORY_CARE_PROVIDER_SITE_OTHER): Payer: 59

## 2021-11-07 DIAGNOSIS — Z23 Encounter for immunization: Secondary | ICD-10-CM

## 2021-11-07 NOTE — Progress Notes (Signed)
After obtaining consent, and per orders of Dr. Quay Burow, injection of Shingrix given the left deltoid  by Marrian Salvage. Patient tolerated well and instructed to report any adverse reaction to me immediately.

## 2021-11-29 ENCOUNTER — Encounter: Payer: Self-pay | Admitting: Internal Medicine

## 2021-12-27 ENCOUNTER — Ambulatory Visit (INDEPENDENT_AMBULATORY_CARE_PROVIDER_SITE_OTHER)
Admission: RE | Admit: 2021-12-27 | Discharge: 2021-12-27 | Disposition: A | Discharge status: Home / Self Care | Payer: 59 | Source: Ambulatory Visit

## 2021-12-27 DIAGNOSIS — Z13820 Encounter for screening for osteoporosis: Secondary | ICD-10-CM

## 2021-12-27 DIAGNOSIS — E2839 Other primary ovarian failure: Secondary | ICD-10-CM | POA: Diagnosis not present

## 2021-12-28 ENCOUNTER — Encounter: Payer: Self-pay | Admitting: Internal Medicine

## 2021-12-28 DIAGNOSIS — M8580 Other specified disorders of bone density and structure, unspecified site: Secondary | ICD-10-CM | POA: Insufficient documentation

## 2022-01-09 ENCOUNTER — Encounter: Payer: Self-pay | Admitting: Internal Medicine

## 2022-01-09 NOTE — Progress Notes (Unsigned)
    Subjective:    Patient ID: Whitney Blake, female    DOB: 06-01-1960, 61 y.o.   MRN: [id number]      HPI Whitney Blake is here for No chief complaint on file.   She is here for an acute visit for cold symptoms.   Her symptoms started 2 weeks ago  She is experiencing   She has tried taking       Medications and allergies reviewed with patient and updated if appropriate.  Current Outpatient Medications on File Prior to Visit  Medication Sig Dispense Refill   cholecalciferol (VITAMIN D) 1000 units tablet Take 2,000 Units by mouth daily.     clobetasol ointment (TEMOVATE) 0.05 % SMARTSIG:Sparingly Topical Twice Daily PRN     Multiple Vitamin (MULTIVITAMIN WITH MINERALS) TABS tablet Take 1 tablet by mouth daily.     telmisartan-hydrochlorothiazide (MICARDIS HCT) 40-12.5 MG tablet TAKE 1 TABLET BY MOUTH DAILY. 90 tablet 3   No current facility-administered medications on file prior to visit.    Review of Systems     Objective:  There were no vitals filed for this visit. BP Readings from Last 3 Encounters:  10/27/21 132/82  05/09/21 102/63  09/13/20 140/78   Wt Readings from Last 3 Encounters:  10/27/21 182 lb (82.6 kg)  05/09/21 180 lb (81.6 kg)  04/18/21 180 lb (81.6 kg)   There is no height or weight on file to calculate BMI.    Physical Exam         Assessment & Plan:    See Problem List for Assessment and Plan of chronic medical problems.

## 2022-01-10 ENCOUNTER — Ambulatory Visit: Payer: 59 | Admitting: Internal Medicine

## 2022-01-10 VITALS — BP 148/86 | HR 72 | Temp 98.5°F | Ht 64.5 in | Wt 184.0 lb

## 2022-01-10 DIAGNOSIS — I10 Essential (primary) hypertension: Secondary | ICD-10-CM | POA: Diagnosis not present

## 2022-01-10 DIAGNOSIS — J019 Acute sinusitis, unspecified: Secondary | ICD-10-CM

## 2022-01-10 MED ORDER — AMOXICILLIN-POT CLAVULANATE 875-125 MG PO TABS: 1.0000 | ORAL_TABLET | Freq: Two times a day (BID) | ORAL | 0 refills | Status: DC

## 2022-01-10 MED ORDER — HYDROCOD POLI-CHLORPHE POLI ER 10-8 MG/5ML PO SUER: 5.0000 mL | Freq: Two times a day (BID) | ORAL | 0 refills | Status: DC | PRN

## 2022-01-10 NOTE — Assessment & Plan Note (Signed)
Chronic Blood pressure is elevated here today-typically better controlled She is sick which may explain the elevation No change in medication Monitor BP at home Continue telmisartan-hydrochlorothiazide 40-12.5 mg daily

## 2022-01-10 NOTE — Patient Instructions (Addendum)
      Medications changes include :   Augmentin twice daily x 10 days, tussionex cough syrup      Return if symptoms worsen or fail to improve.

## 2022-01-10 NOTE — Assessment & Plan Note (Signed)
Acute Likely bacterial  Start Augmentin 875-125 mg BID x 10 day, Tussionex cough syrup otc cold medications Rest, fluid Call if no improvement

## 2022-01-13 ENCOUNTER — Telehealth: Payer: Self-pay | Admitting: Internal Medicine

## 2022-01-13 NOTE — Telephone Encounter (Signed)
Spoke with patient today and she will follow up if not better next week.

## 2022-01-13 NOTE — Telephone Encounter (Signed)
The antibiotic given is pretty broad-spectrum and does treat sinus infections, bronchitis and pneumonia.  There is always a concern for something viral.  She could have RSV or a different virus and if that is the case the antibiotic may not help.  If her symptoms continue to get worse she may need to be reevaluated.

## 2022-01-13 NOTE — Telephone Encounter (Signed)
Patient called and said that last night she was running a fever of around 100.  Since her appointment she is feeling more achy in her back and legs. She has switched up from tylenol to ibuprofen.  Patient just want to know if you think the treatment you prescribed just has not had time to work or if you think she may need something stronger.  Please call patient and advise.

## 2022-01-15 ENCOUNTER — Encounter: Payer: Self-pay | Admitting: Internal Medicine

## 2022-01-16 ENCOUNTER — Encounter: Payer: Self-pay | Admitting: Family Medicine

## 2022-01-16 ENCOUNTER — Ambulatory Visit (HOSPITAL_BASED_OUTPATIENT_CLINIC_OR_DEPARTMENT_OTHER)
Admission: RE | Admit: 2022-01-16 | Discharge: 2022-01-16 | Disposition: A | Discharge status: Home / Self Care | Payer: 59 | Source: Ambulatory Visit | Attending: Family Medicine | Admitting: Family Medicine

## 2022-01-16 ENCOUNTER — Ambulatory Visit: Payer: 59 | Admitting: Family Medicine

## 2022-01-16 VITALS — BP 132/80 | HR 73 | Temp 98.7°F | Resp 18 | Ht 64.5 in | Wt 183.2 lb

## 2022-01-16 DIAGNOSIS — R052 Subacute cough: Secondary | ICD-10-CM

## 2022-01-16 DIAGNOSIS — R509 Fever, unspecified: Secondary | ICD-10-CM

## 2022-01-16 DIAGNOSIS — M545 Low back pain, unspecified: Secondary | ICD-10-CM | POA: Diagnosis not present

## 2022-01-16 LAB — POCT URINALYSIS DIP (MANUAL ENTRY)
Bilirubin, UA: NEGATIVE
Blood, UA: NEGATIVE
Glucose, UA: NEGATIVE mg/dL
Leukocytes, UA: NEGATIVE
Nitrite, UA: NEGATIVE
Protein Ur, POC: NEGATIVE mg/dL
Spec Grav, UA: 1.025 (ref 1.010–1.025)
Urobilinogen, UA: 0.2 E.U./dL
pH, UA: 6 (ref 5.0–8.0)

## 2022-01-16 LAB — POCT INFLUENZA A/B
Influenza A, POC: NEGATIVE
Influenza B, POC: NEGATIVE

## 2022-01-16 LAB — POC COVID19 BINAXNOW: SARS Coronavirus 2 Ag: NEGATIVE

## 2022-01-16 MED ORDER — PREDNISONE 20 MG PO TABS: ORAL_TABLET | ORAL | 0 refills | Status: DC

## 2022-01-16 NOTE — Telephone Encounter (Signed)
Patient called back and is still not feeling well

## 2022-01-16 NOTE — Patient Instructions (Signed)
Good to see you today- I am sorry you are sick!  Stop at the lab and then imaging on the ground floor to have your chest x-ray Prednisone for 6 days for your cough I will be in touch with your labs and chest film asap If you are worsening please alert me!

## 2022-01-16 NOTE — Progress Notes (Signed)
[place] [facility] [address], [place], [place] [address] [phone]  Date:  01/16/2022   Name:  Whitney Blake   DOB:  10-07-1960   MRN:  [id number]  PCP:  Binnie Rail, MD    Chief Complaint: URI (Sxs initially started on 12/10/21. She has been seen by Dr Quay Burow. Pt is on Augmentin. Says she has some low back and leg aches, cough, fever, and congestion. Covid test was negative 2 weeks ago. )   History of Present Illness:  LEXANDRA Blake is a 61 y.o. very pleasant female patient who presents with the following:  Pt seen today with concern of illness-history of prediabetes, spine surgery, essential hypertension I have not seen her myself in the past  She was seen by her PCP Dr. Quay Burow for a sinus infection 11/28- she was started on augmentin and tussionex by Dr Quay Burow.  She stopped taking the tussionex but is still using the augmentin -she estimates she has 3 days left She has had intermittent ST for about a month prior to her visit on 11/28 but this is now resolved About 5 days ago she noted aches in her lower back and legs- this continues She has had back surgery in the past but this seems different  She did note a fever last week- last night she got up to about 99 She notes a lot of fatigue, she also has chills  She has not noted urinary tract symptoms No vomiting or diarrhea She continues to cough quite a bit  Patient Active Problem List   Diagnosis Date Noted   Osteopenia 12/28/2021   Arthritis of carpometacarpal ([place]) joint of left thumb 10/27/2021   Achilles tendon pain 10/27/2021   Achilles tendinitis of both lower extremities 09/13/2020   Chronic right shoulder pain 05/26/2020   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   Anosmia 01/21/2020   Ageusia 01/21/2020   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   History of COVID-19 11/05/2019   Memory loss 11/05/2019   Smell or taste sensation disturbance 07/03/2019   Bursitis of  right foot 05/01/2019   Leg cramping 07/02/2018   Unspecified dyspareunia (CODE) 44/02/270   Lichen sclerosus of female genitalia 05/04/2017   Vitamin D deficiency 05/03/2017   Lumbar radiculopathy 05/15/2016   Prediabetes 04/29/2016   Sinus infection 04/22/2015   Nephrolithiasis 09/06/2012   Essential hypertension 12/07/2011   Migraine 12/21/2008   NONSPECIFIC ABNORMAL ELECTROCARDIOGRAM 09/03/2007   SPINAL STENOSIS 06/14/2006    Past Medical History:  Diagnosis Date   Anemia    PMH of   Cervical spinal stenosis    Dr Arnoldo Morale, [place]   Hypertension    Internal hemorrhoids    Migraines    menstrual   Nonspecific ST-T changes 02/13/2005   Normal nuclear stress test 02/13/2005    Past Surgical History:  Procedure Laterality Date   cesearean   1989   COLONOSCOPY  02/13/2010   neg; [facility]   CONCUSSION     DUE TO FALL AT AGE 38   FOOT SURGERY     Right    LUMBAR LAMINECTOMY  02/13/2001   FOR RUPTURED DISC   ORIF CLAVICULAR FRACTURE     bilaterally @ age 45   TONSILLECTOMY AND ADENOIDECTOMY      Social History   Tobacco Use   Smoking status: Never   Smokeless tobacco: Never  Vaping Use   Vaping Use: Never used  Substance Use Topics  Alcohol use: No   Drug use: No    Family History  Problem Relation Age of Onset   Hypertension Mother        bilateral endarterectomies,pacer,mesenteric artery bypass, renal artery stenosis stented unilaterally, intestinal perforation, steroids for colitis induced osteoprosis, polyps    Cancer Mother        uterine S/P Tamoxifen   Transient ischemic attack Mother        in early 38's   Colon polyps Mother    Irritable bowel syndrome Mother    Breast cancer Mother    Heart disease Mother        pacer   Asthma Father    Hypertension Father    Colon polyps Father    Heart disease Father        S/P ablation for AF   Stroke Father        cns bleed on coumadin   Hypertension Sister        2 sisters   Cancer Sister         cervical   Cancer Maternal Grandmother        uterine   Diabetes Maternal Grandmother    Colon cancer Neg Hx    Esophageal cancer Neg Hx    Stomach cancer Neg Hx    Rectal cancer Neg Hx     Allergies  Allergen Reactions   Rofecoxib Swelling    Vioxx caused  swelling of hands& feet   Coreg [Carvedilol] Other (See Comments)    Caused hot flashes    Medication list has been reviewed and updated.  Current Outpatient Medications on File Prior to Visit  Medication Sig Dispense Refill   amoxicillin-clavulanate (AUGMENTIN) 875-125 MG tablet Take 1 tablet by mouth 2 (two) times daily. 20 tablet 0   cholecalciferol (VITAMIN D) 1000 units tablet Take 2,000 Units by mouth daily.     clobetasol ointment (TEMOVATE) 0.05 % SMARTSIG:Sparingly Topical Twice Daily PRN     Multiple Vitamin (MULTIVITAMIN WITH MINERALS) TABS tablet Take 1 tablet by mouth daily.     telmisartan-hydrochlorothiazide (MICARDIS HCT) 40-12.5 MG tablet TAKE 1 TABLET BY MOUTH DAILY. 90 tablet 3   No current facility-administered medications on file prior to visit.    Review of Systems:  As per HPI- otherwise negative.   Physical Examination: Vitals:   01/16/22 1527  BP: 132/80  Pulse: 73  Resp: 18  Temp: 98.7 F (37.1 C)  SpO2: 98%   Vitals:   01/16/22 1527  Weight: 183 lb 3.2 oz (83.1 kg)  Height: 5' 4.5" (1.638 m)   Body mass index is 30.96 kg/m. Ideal Body Weight: Weight in (lb) to have BMI = 25: 147.6  GEN: no acute distress.  Overweight, nontoxic but is coughing in room HEENT: Atraumatic, Normocephalic. Bilateral TM wnl, oropharynx normal.  PEERL,EOMI.   Ears and Nose: No external deformity. CV: RRR, No M/G/R. No JVD. No thrill. No extra heart sounds. PULM: CTA B, no wheezes, crackles, rhonchi. No retractions. No resp. distress. No accessory muscle use. ABD: S, NT, ND, +BS. No rebound. No HSM.  No CVA tenderness EXTR: No c/c/e PSYCH: Normally interactive. Conversant.   Results for orders  placed or performed in visit on 01/16/22  POC COVID-19 BinaxNow  Result Value Ref Range   SARS Coronavirus 2 Ag Negative Negative  POCT Influenza A/B  Result Value Ref Range   Influenza A, POC Negative Negative   Influenza B, POC Negative Negative  POCT urinalysis dipstick  Result  Value Ref Range   Color, UA yellow yellow   Clarity, UA cloudy (A) clear   Glucose, UA negative negative mg/dL   Bilirubin, UA negative negative   Ketones, POC UA moderate (40) (A) negative mg/dL   Spec Grav, UA 1.025 1.010 - 1.025   Blood, UA negative negative   pH, UA 6.0 5.0 - 8.0   Protein Ur, POC negative negative mg/dL   Urobilinogen, UA 0.2 0.2 or 1.0 E.U./dL   Nitrite, UA Negative Negative   Leukocytes, UA Negative Negative    Assessment and Plan: Low grade fever - Plan: POC COVID-19 BinaxNow, POCT Influenza A/B, CBC, Comprehensive metabolic panel, Sedimentation rate  Subacute cough - Plan: DG Chest 2 View, predniSONE (DELTASONE) 20 MG tablet  Acute bilateral low back pain without sciatica - Plan: Urine Culture, POCT urinalysis dipstick  Patient seen today with persistent cough, low-grade fever, lower back pain.  Suspect a viral etiology, bacterial pneumonia is also possible She is negative for flu and COVID, unfortunately we do not have RSV testing available We will obtain a chest film Continue Augmentin Lab work pending as above Prednisone to help with persistent cough and chest congestion I have asked her to be closely posted about her progress  Signed Lamar Blinks, MD  Received x-ray as below, message to patient DG Chest 2 View  Result Date: 01/16/2022 CLINICAL DATA:  Persistent cough for 1 month EXAM: CHEST - 2 VIEW COMPARISON:  None Available. FINDINGS: Mild cardiomegaly. Both lungs are clear. Disc degenerative disease of the thoracic spine. IMPRESSION: Mild cardiomegaly without acute abnormality of the lungs. Electronically Signed   By: Delanna Ahmadi M.D.   On: 01/16/2022 17:00

## 2022-01-17 ENCOUNTER — Ambulatory Visit: Payer: 59 | Admitting: Internal Medicine

## 2022-01-17 LAB — COMPREHENSIVE METABOLIC PANEL
ALT: 27 U/L (ref 0–35)
AST: 19 U/L (ref 0–37)
Albumin: 4.4 g/dL (ref 3.5–5.2)
Alkaline Phosphatase: 53 U/L (ref 39–117)
BUN: 14 mg/dL (ref 6–23)
CO2: 29 mEq/L (ref 19–32)
Calcium: 9.2 mg/dL (ref 8.4–10.5)
Chloride: 99 mEq/L (ref 96–112)
Creatinine, Ser: 0.9 mg/dL (ref 0.40–1.20)
GFR: 68.52 mL/min (ref >60.00)
Glucose, Bld: 96 mg/dL (ref 70–99)
Potassium: 3.9 mEq/L (ref 3.5–5.1)
Sodium: 137 mEq/L (ref 135–145)
Total Bilirubin: 0.5 mg/dL (ref 0.2–1.2)
Total Protein: 7.4 g/dL (ref 6.0–8.3)

## 2022-01-17 LAB — CBC
HCT: 41.5 % (ref 36.0–46.0)
Hemoglobin: 13.9 g/dL (ref 12.0–15.0)
MCHC: 33.5 g/dL (ref 30.0–36.0)
MCV: 88.4 fl (ref 78.0–100.0)
Platelets: 288 10*3/uL (ref 150.0–400.0)
RBC: 4.69 Mil/uL (ref 3.87–5.11)
RDW: 13.7 % (ref 11.5–15.5)
WBC: 7.4 10*3/uL (ref 4.0–10.5)

## 2022-01-17 LAB — URINE CULTURE
MICRO NUMBER:: 14265691
Result:: NO GROWTH
SPECIMEN QUALITY:: ADEQUATE

## 2022-01-17 LAB — SEDIMENTATION RATE: Sed Rate: 18 mm/hr (ref 0–30)

## 2022-01-18 ENCOUNTER — Encounter: Payer: Self-pay | Admitting: Family Medicine

## 2022-01-18 DIAGNOSIS — I517 Cardiomegaly: Secondary | ICD-10-CM

## 2022-01-30 NOTE — Progress Notes (Deleted)
Virtual Visit via Video Note  I connected with Whitney Blake on 01/30/22 at  2:40 PM EST by a video enabled telemedicine application and verified that I am speaking with the correct person using two identifiers.   I discussed the limitations of evaluation and management by telemedicine and the availability of in person appointments. The patient expressed understanding and agreed to proceed.  Present for the visit:  Myself, Dr Billey Gosling, [place] Graff.  The patient is currently at home and I am in the office.    No referring provider.    History of Present Illness: She is here for an acute visit for cold symptoms.   Her symptoms started   She is experiencing   She has tried taking    ROS    Social History   Socioeconomic History   Marital status: Married    Spouse name: Not on file   Number of children: 2   Years of education: Not on file   Highest education level: Not on file  Occupational History   Occupation: Chemical engineer: [employer]  Tobacco Use   Smoking status: Never   Smokeless tobacco: Never  Vaping Use   Vaping Use: Never used  Substance and Sexual Activity   Alcohol use: No   Drug use: No   Sexual activity: Not on file  Other Topics Concern   Not on file  Social History Narrative   Exercises 3 a week    Daily caffeine    Social Determinants of Health   Financial Resource Strain: Not on file  Food Insecurity: Not on file  Transportation Needs: Not on file  Physical Activity: Not on file  Stress: Not on file  Social Connections: Not on file     Observations/Objective: Appears well in NAD   Assessment and Plan:  See Problem List for Assessment and Plan of chronic medical problems.   Follow Up Instructions:    I discussed the assessment and treatment plan with the patient. The patient was provided an opportunity to ask questions and all were answered. The patient agreed with the plan and demonstrated an understanding of  the instructions.   The patient was advised to call back or seek an in-person evaluation if the symptoms worsen or if the condition fails to improve as anticipated.    Binnie Rail, MD

## 2022-01-31 ENCOUNTER — Telehealth: Payer: 59 | Admitting: Internal Medicine

## 2022-01-31 ENCOUNTER — Ambulatory Visit: Payer: 59 | Admitting: Internal Medicine

## 2022-02-17 ENCOUNTER — Ambulatory Visit (HOSPITAL_COMMUNITY): Payer: 59 | Attending: Family Medicine

## 2022-02-17 DIAGNOSIS — I517 Cardiomegaly: Secondary | ICD-10-CM | POA: Diagnosis not present

## 2022-02-17 LAB — ECHOCARDIOGRAM COMPLETE
Area-P 1/2: 3.27 cm2
S' Lateral: 2.7 cm

## 2022-02-21 ENCOUNTER — Encounter: Payer: Self-pay | Admitting: Family Medicine

## 2022-04-12 IMAGING — MG DIGITAL SCREENING BILAT W/ TOMO W/ CAD
6 of 12 series · 6 of 36 positions shown · non-contrast
Comparison: Previous exam(s).

CLINICAL DATA: Screening.

EXAM:
DIGITAL SCREENING BILATERAL MAMMOGRAM WITH TOMO AND CAD

[L XCCL synth-2D]
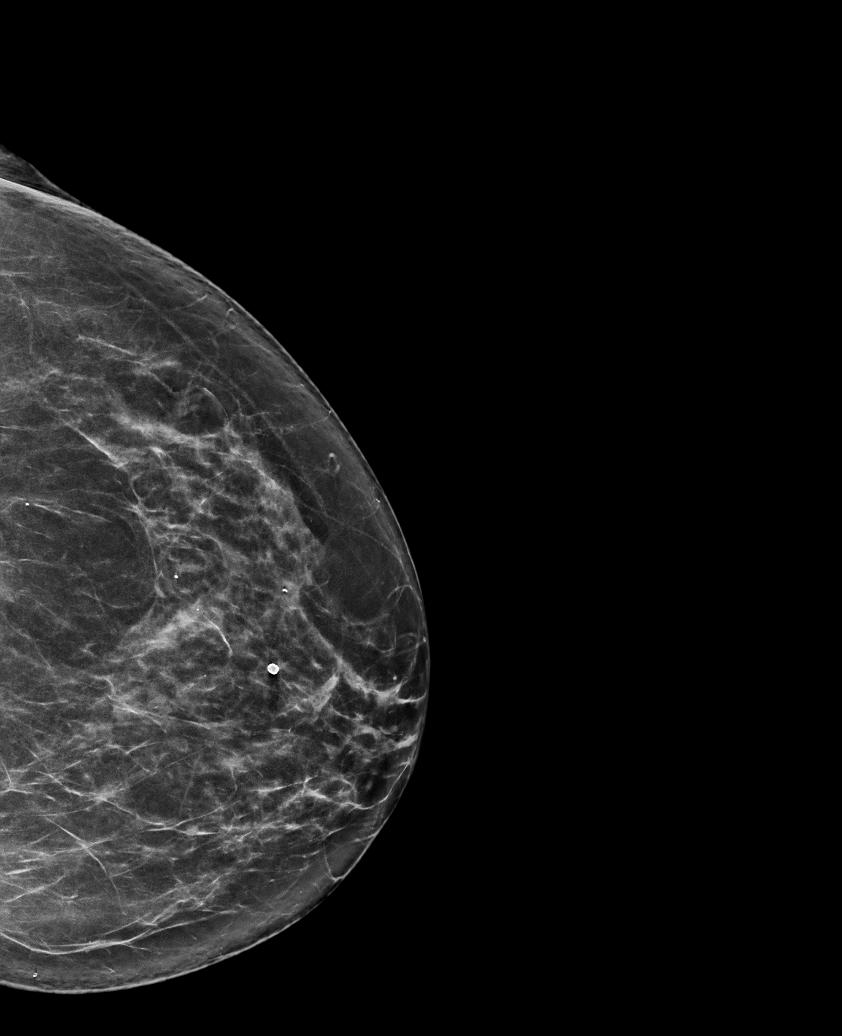

[L CC synth-2D]
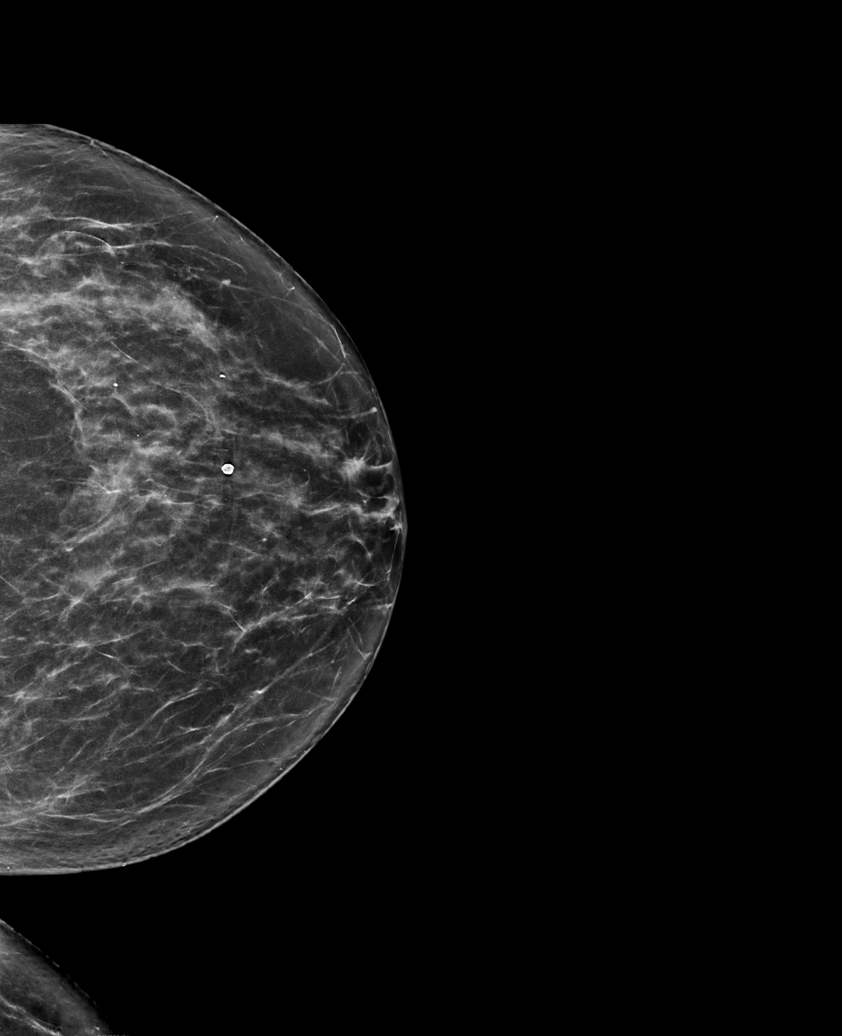

[L MLO synth-2D]
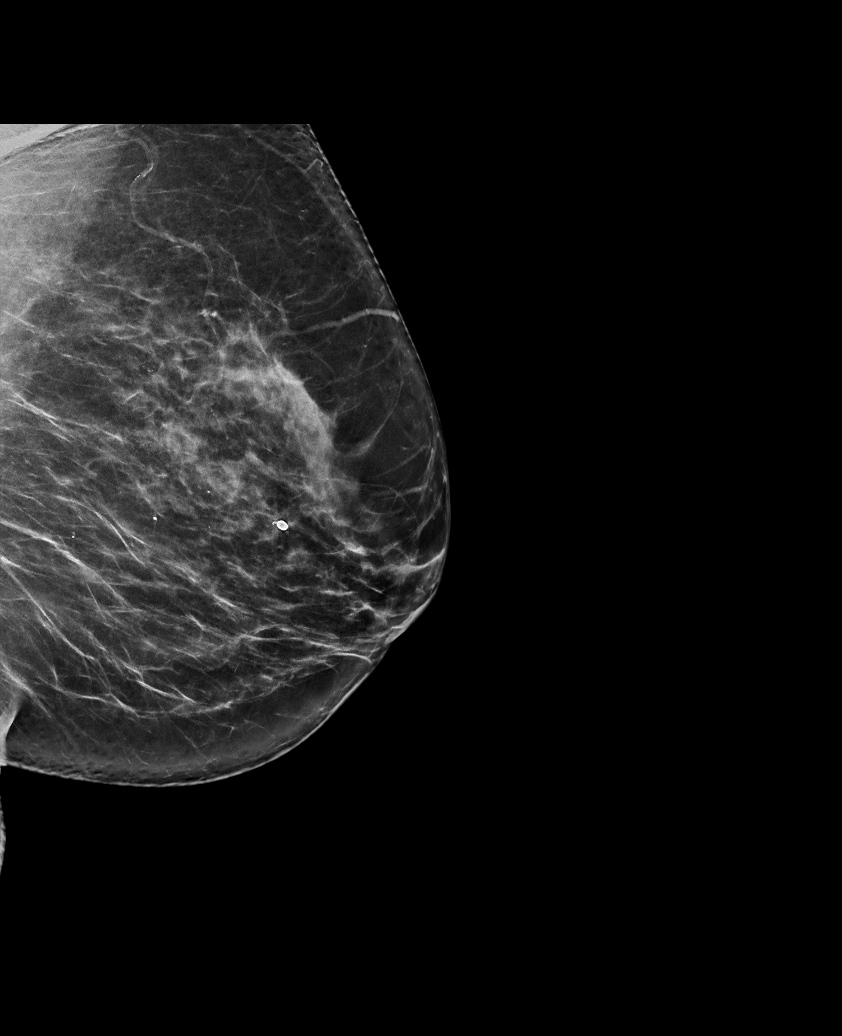

[R CC synth-2D]
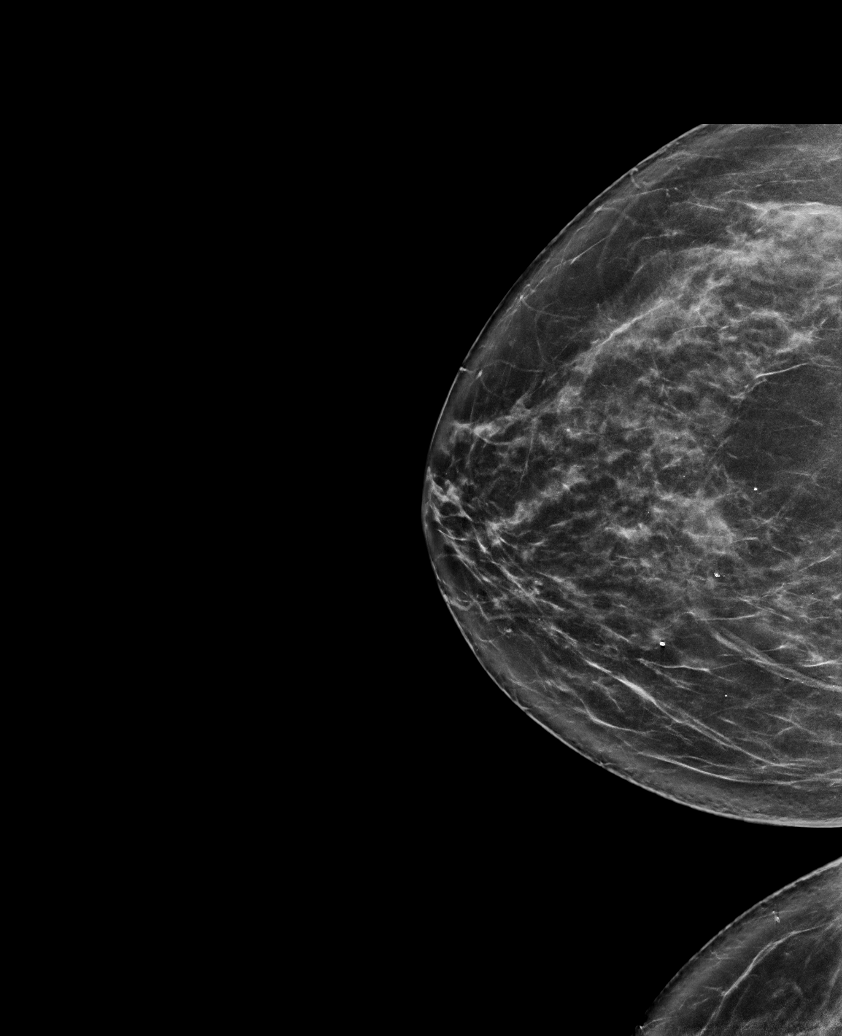

[R MLO synth-2D]
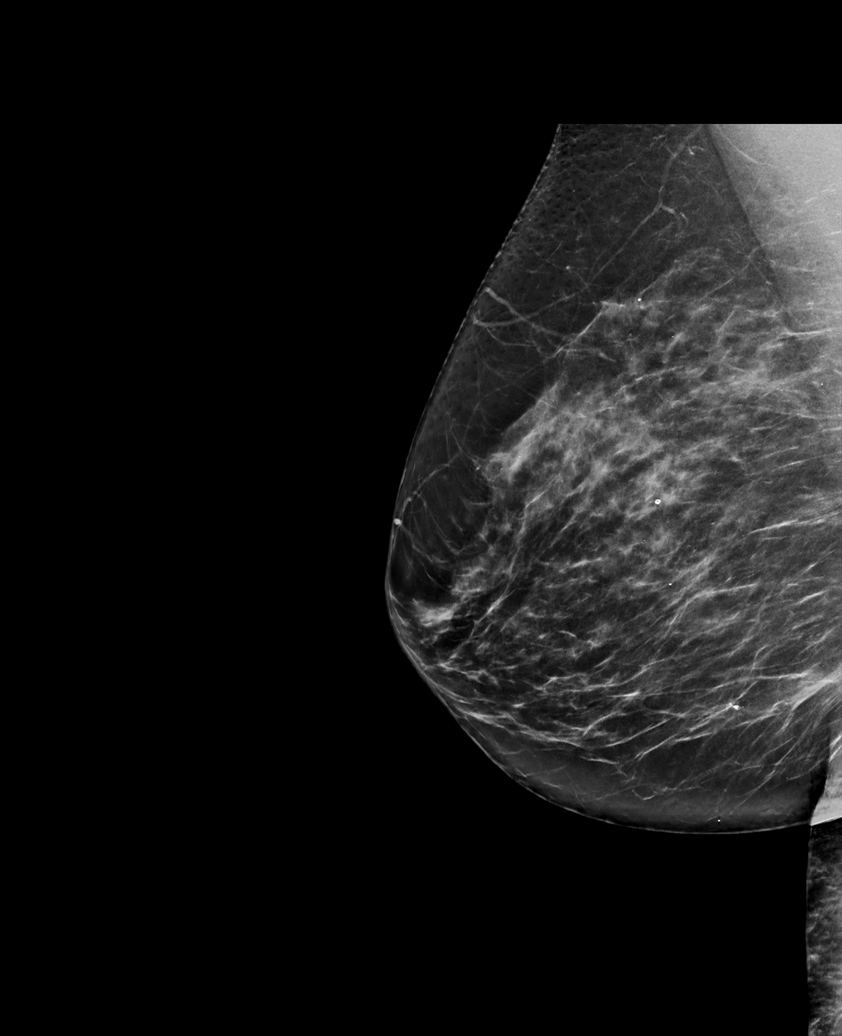

[R XCCL synth-2D]
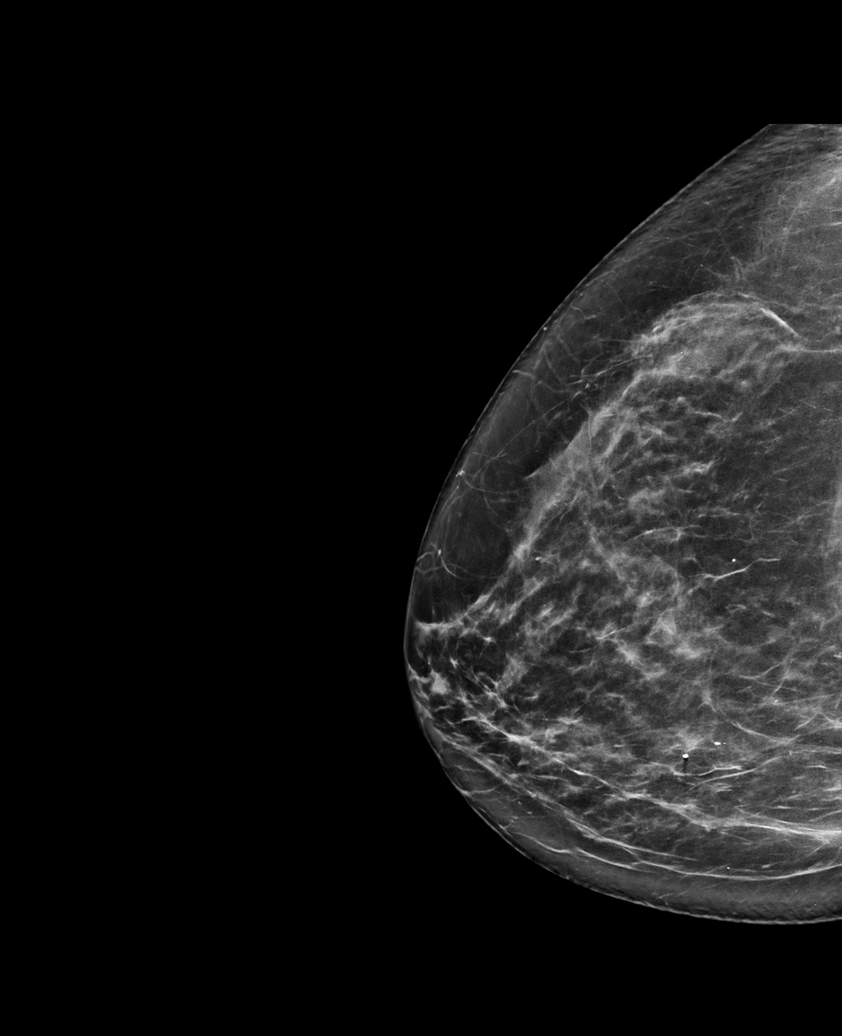

[6 of 36 positions shown; findings below may reference images not displayed]

ACR Breast Density Category c: The breast tissue is heterogeneously
dense, which may obscure small masses.
FINDINGS: There are no findings suspicious for malignancy. Images were
processed with CAD.
IMPRESSION: No mammographic evidence of malignancy. A result letter of this
screening mammogram will be mailed directly to the patient.

RECOMMENDATION:
Screening mammogram in one year. (Code:[id number]-[id number])

BI-RADS CATEGORY  1: Negative.

## 2022-04-20 ENCOUNTER — Ambulatory Visit (INDEPENDENT_AMBULATORY_CARE_PROVIDER_SITE_OTHER): Payer: 59

## 2022-04-20 DIAGNOSIS — Z23 Encounter for immunization: Secondary | ICD-10-CM | POA: Diagnosis not present

## 2022-04-20 NOTE — Progress Notes (Signed)
Pt was given 2nd shingles vacc w/o any complications.

## 2022-05-26 ENCOUNTER — Other Ambulatory Visit: Payer: Self-pay | Admitting: Obstetrics and Gynecology

## 2022-05-26 DIAGNOSIS — Z1231 Encounter for screening mammogram for malignant neoplasm of breast: Secondary | ICD-10-CM

## 2022-06-12 ENCOUNTER — Ambulatory Visit: Payer: 59 | Admitting: Internal Medicine

## 2022-06-12 VITALS — BP 132/76 | HR 73 | Temp 99.0°F | Ht 64.5 in

## 2022-06-12 DIAGNOSIS — I10 Essential (primary) hypertension: Secondary | ICD-10-CM | POA: Diagnosis not present

## 2022-06-12 DIAGNOSIS — J019 Acute sinusitis, unspecified: Secondary | ICD-10-CM

## 2022-06-12 MED ORDER — CEFDINIR 300 MG PO CAPS: 300.0000 mg | ORAL_CAPSULE | Freq: Two times a day (BID) | ORAL | 0 refills | Status: DC

## 2022-06-12 NOTE — Patient Instructions (Addendum)
       Medications changes include :   omnicef twice daily for 10 days     Return if symptoms worsen or fail to improve.

## 2022-06-12 NOTE — Progress Notes (Signed)
Subjective:    Patient ID: Whitney Blake, female    DOB: 01-22-1961, 62 y.o.   MRN: [id number]      HPI Cheryllynn is here for  Chief Complaint  Patient presents with   Cough    Cough and lots of drainage in her throat; She was prescribe prednisone and Augmentin. Said while taking it it made her feel worse.  Today right ear is painful and she is having sharp pains shooting down into throat. Hurts all over; Back was hurting Friday all the way down into legs.     Symptoms started 4-5 weeks ago.  Her symptoms initially started with a feeling of fluid in her right ear - lasted a couple of days - went away.  Then her eyes felt like a got liquid in it.  ?  Allergies-she thought it may resolve seasonal allergies-though symptoms seem to have went away.  She then developed a bad cough -still with drainage in the throat.  Last Wednesday she saw the nurse at work and was prescribed Augmentin and prednisone.  The next few days she felt like her symptoms were getting worse so she stopped the medication 3 days later.  She still feeling sick and not sure what to do.   She states sharp pain going from the right ear down into the throat.  The pain feels deep.  She is having chills, sweats and low-grade fevers.  She has no appetite.  Her legs ache and she had an episode of lower back pain that may have been muscle spasms.  She states nasal congestion, sinus pain and pressure, sinus headaches, dizziness, postnasal drainage, cough and some wheezing.  She denies shortness of breath, but when she was talking to her sister on the phone she mentioned that she sounded short of breath.  Taking tylenol.     Covid test at home neg   Medications and allergies reviewed with patient and updated if appropriate.  Current Outpatient Medications on File Prior to Visit  Medication Sig Dispense Refill   cholecalciferol (VITAMIN D) 1000 units tablet Take 2,000 Units by mouth daily.     clobetasol ointment (TEMOVATE) 0.05  % SMARTSIG:Sparingly Topical Twice Daily PRN     Multiple Vitamin (MULTIVITAMIN WITH MINERALS) TABS tablet Take 1 tablet by mouth daily.     telmisartan-hydrochlorothiazide (MICARDIS HCT) 40-12.5 MG tablet TAKE 1 TABLET BY MOUTH DAILY. 90 tablet 3   No current facility-administered medications on file prior to visit.    Review of Systems  Constitutional:  Positive for appetite change, chills, diaphoresis and fever (low grade).  HENT:  Positive for congestion, ear pain (right ear pain), postnasal drip, sinus pressure and sinus pain. Negative for sore throat.   Respiratory:  Positive for cough (able to clear mucus occ in throat) and wheezing (occ). Negative for shortness of breath (she denies - sister states she is SOB when she talks to her).   Gastrointestinal:  Negative for diarrhea and nausea.  Musculoskeletal:  Positive for back pain (lower back pain - radiates down legs - muscle spasms - it did improve with massage) and myalgias (legs).  Neurological:  Positive for dizziness and headaches (sinus). Negative for light-headedness.       Objective:   Vitals:   06/12/22 1557  BP: 132/76  Pulse: 73  Temp: 99 F (37.2 C)  SpO2: 99%   BP Readings from Last 3 Encounters:  06/12/22 132/76  01/16/22 132/80  01/10/22 (!) 148/86   [employer]  Readings from Last 3 Encounters:  01/16/22 183 lb 3.2 oz (83.1 kg)  01/10/22 184 lb (83.5 kg)  10/27/21 182 lb (82.6 kg)   Body mass index is 30.96 kg/m.    Physical Exam Constitutional:      General: She is not in acute distress.    Appearance: Normal appearance. She is not ill-appearing.  HENT:     Head: Normocephalic and atraumatic.     Right Ear: Tympanic membrane, ear canal and external ear normal.     Left Ear: Tympanic membrane, ear canal and external ear normal.     Mouth/Throat:     Mouth: Mucous membranes are moist.     Pharynx: No oropharyngeal exudate or posterior oropharyngeal erythema.  Eyes:     Conjunctiva/sclera: Conjunctivae  normal.  Cardiovascular:     Rate and Rhythm: Normal rate and regular rhythm.  Pulmonary:     Effort: Pulmonary effort is normal. No respiratory distress.     Breath sounds: Normal breath sounds. No wheezing or rales.  Musculoskeletal:     Cervical back: Neck supple. No tenderness.  Lymphadenopathy:     Cervical: No cervical adenopathy.  Skin:    General: Skin is warm and dry.  Neurological:     Mental Status: She is alert.            Assessment & Plan:    See Problem List for Assessment and Plan of chronic medical problems.

## 2022-06-12 NOTE — Assessment & Plan Note (Signed)
Acute She does have several symptoms just of sinus infection so we will go ahead and treat with Omnicef 300 mg twice daily x 10 days She did test herself for COVID and that was negative.  Unlikely flu or RSV Hopefully the antibiotic will help and she will improve Symptomatic treatment with Tylenol, okay to take Motrin She will update me on her symptoms so we can adjust treatment if needed

## 2022-06-12 NOTE — Assessment & Plan Note (Signed)
Chronic Blood pressure is  controlled Continue telmisartan-hydrochlorothiazide 40-12.5 mg daily

## 2022-07-07 ENCOUNTER — Ambulatory Visit
Admission: RE | Admit: 2022-07-07 | Discharge: 2022-07-07 | Disposition: A | Discharge status: Home / Self Care | Payer: 59 | Source: Ambulatory Visit | Attending: Obstetrics and Gynecology | Admitting: Obstetrics and Gynecology

## 2022-07-07 DIAGNOSIS — Z1231 Encounter for screening mammogram for malignant neoplasm of breast: Secondary | ICD-10-CM

## 2022-11-03 ENCOUNTER — Other Ambulatory Visit: Payer: Self-pay | Admitting: Internal Medicine

## 2022-11-06 NOTE — Telephone Encounter (Signed)
Physical scheduled today.

## 2022-11-27 DIAGNOSIS — E7800 Pure hypercholesterolemia, unspecified: Secondary | ICD-10-CM | POA: Insufficient documentation

## 2022-11-27 DIAGNOSIS — E785 Hyperlipidemia, unspecified: Secondary | ICD-10-CM | POA: Insufficient documentation

## 2022-11-27 NOTE — Patient Instructions (Addendum)
Flu immunization administered today.     Blood work was ordered.   The lab is on the first floor.    Medications changes include :   none    Return in about 6 months (around 05/29/2023) for follow up.   Health Maintenance, Female Adopting a healthy lifestyle and getting preventive care are important in promoting health and wellness. Ask your health care provider about: The right schedule for you to have regular tests and exams. Things you can do on your own to prevent diseases and keep yourself healthy. What should I know about diet, weight, and exercise? Eat a healthy diet  Eat a diet that includes plenty of vegetables, fruits, low-fat dairy products, and lean protein. Do not eat a lot of foods that are high in solid fats, added sugars, or sodium. Maintain a healthy weight Body mass index (BMI) is used to identify weight problems. It estimates body fat based on height and weight. Your health care provider can help determine your BMI and help you achieve or maintain a healthy weight. Get regular exercise Get regular exercise. This is one of the most important things you can do for your health. Most adults should: Exercise for at least 150 minutes each week. The exercise should increase your heart rate and make you sweat (moderate-intensity exercise). Do strengthening exercises at least twice a week. This is in addition to the moderate-intensity exercise. Spend less time sitting. Even light physical activity can be beneficial. Watch cholesterol and blood lipids Have your blood tested for lipids and cholesterol at 63 years of age, then have this test every 5 years. Have your cholesterol levels checked more often if: Your lipid or cholesterol levels are high. You are older than 63 years of age. You are at high risk for heart disease. What should I know about cancer screening? Depending on your health history and family history, you may need to have cancer screening at various  ages. This may include screening for: Breast cancer. Cervical cancer. Colorectal cancer. Skin cancer. Lung cancer. What should I know about heart disease, diabetes, and high blood pressure? Blood pressure and heart disease High blood pressure causes heart disease and increases the risk of stroke. This is more likely to develop in people who have high blood pressure readings or are overweight. Have your blood pressure checked: Every 3-5 years if you are 36-24 years of age. Every year if you are 89 years old or older. Diabetes Have regular diabetes screenings. This checks your fasting blood sugar level. Have the screening done: Once every three years after age 50 if you are at a normal weight and have a low risk for diabetes. More often and at a younger age if you are overweight or have a high risk for diabetes. What should I know about preventing infection? Hepatitis B If you have a higher risk for hepatitis B, you should be screened for this virus. Talk with your health care provider to find out if you are at risk for hepatitis B infection. Hepatitis C Testing is recommended for: Everyone born from [birth year] through 1966. Anyone with known risk factors for hepatitis C. Sexually transmitted infections (STIs) Get screened for STIs, including gonorrhea and chlamydia, if: You are sexually active and are younger than 63 years of age. You are older than 63 years of age and your health care provider tells you that you are at risk for this type of infection. Your sexual activity has changed since you were last screened,  and you are at increased risk for chlamydia or gonorrhea. Ask your health care provider if you are at risk. Ask your health care provider about whether you are at high risk for HIV. Your health care provider may recommend a prescription medicine to help prevent HIV infection. If you choose to take medicine to prevent HIV, you should first get tested for HIV. You should then be tested  every 3 months for as long as you are taking the medicine. Pregnancy If you are about to stop having your period (premenopausal) and you may become pregnant, seek counseling before you get pregnant. Take 400 to 800 micrograms (mcg) of folic acid every day if you become pregnant. Ask for birth control (contraception) if you want to prevent pregnancy. Osteoporosis and menopause Osteoporosis is a disease in which the bones lose minerals and strength with aging. This can result in bone fractures. If you are 49 years old or older, or if you are at risk for osteoporosis and fractures, ask your health care provider if you should: Be screened for bone loss. Take a calcium or vitamin D supplement to lower your risk of fractures. Be given hormone replacement therapy (HRT) to treat symptoms of menopause. Follow these instructions at home: Alcohol use Do not drink alcohol if: Your health care provider tells you not to drink. You are pregnant, may be pregnant, or are planning to become pregnant. If you drink alcohol: Limit how much you have to: 0-1 drink a day. Know how much alcohol is in your drink. In the [place] one drink equals one 12 oz bottle of beer (355 mL), one 5 oz glass of wine (148 mL), or one 1 oz glass of hard liquor (44 mL). Lifestyle Do not use any products that contain nicotine or tobacco. These products include cigarettes, chewing tobacco, and vaping devices, such as e-cigarettes. If you need help quitting, ask your health care provider. Do not use street drugs. Do not share needles. Ask your health care provider for help if you need support or information about quitting drugs. General instructions Schedule regular health, dental, and eye exams. Stay current with your vaccines. Tell your health care provider if: You often feel depressed. You have ever been abused or do not feel safe at home. Summary Adopting a healthy lifestyle and getting preventive care are important in promoting  health and wellness. Follow your health care provider's instructions about healthy diet, exercising, and getting tested or screened for diseases. Follow your health care provider's instructions on monitoring your cholesterol and blood pressure. This information is not intended to replace advice given to you by your health care provider. Make sure you discuss any questions you have with your health care provider. Document Revised: 06/21/2020 Document Reviewed: 06/21/2020 Elsevier Patient Education  2024 [employer].

## 2022-11-27 NOTE — Progress Notes (Unsigned)
Subjective:    Patient ID: Whitney Blake, female    DOB: 1960/04/12, 62 y.o.   MRN: [id number]      HPI Whitney Blake is here for a Physical exam and her chronic medical problems.   Memory concerns - ? Age, ? Covid related.  She can not remember names of people.  Difficulty with recall.     Medications and allergies reviewed with patient and updated if appropriate.  Current Outpatient Medications on File Prior to Visit  Medication Sig Dispense Refill   cholecalciferol (VITAMIN D) 1000 units tablet Take 2,000 Units by mouth daily.     clobetasol ointment (TEMOVATE) 0.05 % SMARTSIG:Sparingly Topical Twice Daily PRN     Multiple Vitamin (MULTIVITAMIN WITH MINERALS) TABS tablet Take 1 tablet by mouth daily.     telmisartan-hydrochlorothiazide (MICARDIS HCT) 40-12.5 MG tablet TAKE 1 TABLET BY MOUTH DAILY. 30 tablet 0   No current facility-administered medications on file prior to visit.    Review of Systems  Constitutional:  Negative for fever.  Eyes:  Negative for visual disturbance.  Respiratory:  Negative for cough, shortness of breath and wheezing.   Cardiovascular:  Negative for chest pain, palpitations and leg swelling.  Gastrointestinal:  Negative for abdominal pain, blood in stool, constipation and diarrhea.       Occ gerd  Genitourinary:  Negative for dysuria.  Musculoskeletal:  Positive for arthralgias (thumb OA ( left)). Negative for back pain.  Skin:  Negative for rash.  Neurological:  Negative for light-headedness and headaches.  Psychiatric/Behavioral:  Negative for dysphoric mood. The patient is not nervous/anxious.        Objective:   Vitals:   11/28/22 1335  BP: 132/78  Pulse: 63  Temp: 98.3 F (36.8 C)  SpO2: 96%   Filed Weights   11/28/22 1335  Weight: 190 lb (86.2 kg)   Body mass index is 32.11 kg/m.  BP Readings from Last 3 Encounters:  11/28/22 132/78  06/12/22 132/76  01/16/22 132/80    Wt Readings from Last 3 Encounters:  11/28/22 190 lb  (86.2 kg)  01/16/22 183 lb 3.2 oz (83.1 kg)  01/10/22 184 lb (83.5 kg)       Physical Exam Constitutional: She appears well-developed and well-nourished. No distress.  HENT:  Head: Normocephalic and atraumatic.  Right Ear: External ear normal. Normal ear canal and TM Left Ear: External ear normal.  Normal ear canal and TM Mouth/Throat: Oropharynx is clear and moist.  Eyes: Conjunctivae normal.  Neck: Neck supple. No tracheal deviation present. No thyromegaly present.  No carotid bruit  Cardiovascular: Normal rate, regular rhythm and normal heart sounds.   No murmur heard.  No edema. Pulmonary/Chest: Effort normal and breath sounds normal. No respiratory distress. She has no wheezes. She has no rales.  Breast: deferred   Abdominal: Soft. She exhibits no distension. There is no tenderness.  Lymphadenopathy: She has no cervical adenopathy.  Skin: Skin is warm and dry. She is not diaphoretic.  Psychiatric: She has a normal mood and affect. Her behavior is normal.     Lab Results  Component Value Date   WBC 7.4 01/16/2022   HGB 13.9 01/16/2022   HCT 41.5 01/16/2022   PLT 288.0 01/16/2022   GLUCOSE 96 01/16/2022   CHOL 176 10/27/2021   TRIG 126.0 10/27/2021   HDL 56.80 10/27/2021   LDLCALC 94 10/27/2021   ALT 27 01/16/2022   AST 19 01/16/2022   NA 137 01/16/2022   K 3.9 01/16/2022  CL 99 01/16/2022   CREATININE 0.90 01/16/2022   BUN 14 01/16/2022   CO2 29 01/16/2022   TSH 2.80 10/27/2021   HGBA1C 5.9 10/27/2021         Assessment & Plan:   Physical exam: Screening blood work  ordered Exercise  regular - elliptical 20 minutes a few times a week, walks dog Weight  obese -discussed weight loss Substance abuse  none   Reviewed recommended immunizations.  Flu immunization administered today.     Health Maintenance  Topic Date Due   INFLUENZA VACCINE  09/14/2022   COVID-19 Vaccine (4 - 2023-24 season) 12/14/2022 (Originally 10/15/2022)   Cervical Cancer  Screening (HPV/Pap Cotest)  09/21/2023   MAMMOGRAM  07/06/2024   DEXA SCAN  12/27/2024   Colonoscopy  05/10/2026   DTaP/Tdap/Td (3 - Td or Tdap) 07/01/2028   Hepatitis C Screening  Completed   HIV Screening  Completed   Zoster Vaccines- Shingrix  Completed   HPV VACCINES  Aged Out          See Problem List for Assessment and Plan of chronic medical problems.

## 2022-11-28 ENCOUNTER — Ambulatory Visit: Payer: 59 | Admitting: Internal Medicine

## 2022-11-28 VITALS — BP 132/78 | HR 63 | Temp 98.3°F | Ht 64.5 in | Wt 190.0 lb

## 2022-11-28 DIAGNOSIS — R413 Other amnesia: Secondary | ICD-10-CM | POA: Diagnosis not present

## 2022-11-28 DIAGNOSIS — Z6832 Body mass index (BMI) 32.0-32.9, adult: Secondary | ICD-10-CM

## 2022-11-28 DIAGNOSIS — I10 Essential (primary) hypertension: Secondary | ICD-10-CM | POA: Diagnosis not present

## 2022-11-28 DIAGNOSIS — Z0000 Encounter for general adult medical examination without abnormal findings: Secondary | ICD-10-CM | POA: Diagnosis not present

## 2022-11-28 DIAGNOSIS — Z23 Encounter for immunization: Secondary | ICD-10-CM

## 2022-11-28 DIAGNOSIS — M85851 Other specified disorders of bone density and structure, right thigh: Secondary | ICD-10-CM | POA: Diagnosis not present

## 2022-11-28 DIAGNOSIS — E7800 Pure hypercholesterolemia, unspecified: Secondary | ICD-10-CM | POA: Diagnosis not present

## 2022-11-28 DIAGNOSIS — E669 Obesity, unspecified: Secondary | ICD-10-CM | POA: Insufficient documentation

## 2022-11-28 DIAGNOSIS — E559 Vitamin D deficiency, unspecified: Secondary | ICD-10-CM

## 2022-11-28 DIAGNOSIS — R7303 Prediabetes: Secondary | ICD-10-CM | POA: Diagnosis not present

## 2022-11-28 DIAGNOSIS — E66811 Obesity, class 1: Secondary | ICD-10-CM

## 2022-11-28 DIAGNOSIS — E6609 Other obesity due to excess calories: Secondary | ICD-10-CM

## 2022-11-28 LAB — COMPREHENSIVE METABOLIC PANEL
ALT: 24 U/L (ref 0–35)
AST: 18 U/L (ref 0–37)
Albumin: 4.4 g/dL (ref 3.5–5.2)
Alkaline Phosphatase: 57 U/L (ref 39–117)
BUN: 12 mg/dL (ref 6–23)
CO2: 30 meq/L (ref 19–32)
Calcium: 9.8 mg/dL (ref 8.4–10.5)
Chloride: 99 meq/L (ref 96–112)
Creatinine, Ser: 0.89 mg/dL (ref 0.40–1.20)
GFR: 69.03 mL/min (ref >60.00)
Glucose, Bld: 88 mg/dL (ref 70–99)
Potassium: 4.2 meq/L (ref 3.5–5.1)
Sodium: 138 meq/L (ref 135–145)
Total Bilirubin: 0.5 mg/dL (ref 0.2–1.2)
Total Protein: 7.5 g/dL (ref 6.0–8.3)

## 2022-11-28 LAB — CBC WITH DIFFERENTIAL/PLATELET
Basophils Absolute: 0.1 10*3/uL (ref 0.0–0.1)
Basophils Relative: 0.7 % (ref 0.0–3.0)
Eosinophils Absolute: 0.2 10*3/uL (ref 0.0–0.7)
Eosinophils Relative: 2.2 % (ref 0.0–5.0)
HCT: 44.3 % (ref 36.0–46.0)
Hemoglobin: 14.5 g/dL (ref 12.0–15.0)
Lymphocytes Relative: 44.2 % (ref 12.0–46.0)
Lymphs Abs: 3.4 10*3/uL (ref 0.7–4.0)
MCHC: 32.7 g/dL (ref 30.0–36.0)
MCV: 89.7 fL (ref 78.0–100.0)
Monocytes Absolute: 0.4 10*3/uL (ref 0.1–1.0)
Monocytes Relative: 4.8 % (ref 3.0–12.0)
Neutro Abs: 3.7 10*3/uL (ref 1.4–7.7)
Neutrophils Relative %: 48.1 % (ref 43.0–77.0)
Platelets: 277 10*3/uL (ref 150.0–400.0)
RBC: 4.94 Mil/uL (ref 3.87–5.11)
RDW: 14.3 % (ref 11.5–15.5)
WBC: 7.7 10*3/uL (ref 4.0–10.5)

## 2022-11-28 LAB — LIPID PANEL
Cholesterol: 183 mg/dL (ref 0–200)
HDL: 54.6 mg/dL (ref >39.00)
LDL Cholesterol: 100 mg/dL — ABNORMAL HIGH (ref 0–99)
NonHDL: 128.35
Total CHOL/HDL Ratio: 3
Triglycerides: 144 mg/dL (ref 0.0–149.0)
VLDL: 28.8 mg/dL (ref 0.0–40.0)

## 2022-11-28 LAB — VITAMIN D 25 HYDROXY (VIT D DEFICIENCY, FRACTURES): VITD: 62 ng/mL (ref 30.00–100.00)

## 2022-11-28 LAB — HEMOGLOBIN A1C: Hgb A1c MFr Bld: 5.9 % (ref 4.6–6.5)

## 2022-11-28 LAB — TSH: TSH: 2.84 u[IU]/mL (ref 0.35–5.50)

## 2022-11-28 LAB — VITAMIN B12: Vitamin B-12: 657 pg/mL (ref 211–911)

## 2022-11-28 NOTE — Assessment & Plan Note (Signed)
Chronic Taking vitamin D daily Check vitamin D level

## 2022-11-28 NOTE — Assessment & Plan Note (Signed)
Chronic Regular exercise and healthy diet encouraged Check lipid panel, CMP, TSH Continue lifestyle control

## 2022-11-28 NOTE — Assessment & Plan Note (Signed)
Chronic DEXA up-to-date Continue calcium and vitamin D supplementation Stressed regular exercise

## 2022-11-28 NOTE — Assessment & Plan Note (Signed)
Chronic Has gained weight She is exercising - the more she can do the better Discussed calorie reduction - diet high in protein, fiber and veges Discussed metformin since she is a prediabetic

## 2022-11-28 NOTE — Assessment & Plan Note (Signed)
Chronic Check a1c Low sugar / carb diet Stressed regular exercise  

## 2022-11-28 NOTE — Assessment & Plan Note (Addendum)
Chronic Blood pressure is  controlled CBC, CMP, lipid panel, hypertension Continue telmisartan-hydrochlorothiazide 40-12.5 mg daily  EKG: Normal sinus bradycardia at 59 bpm, LAE, nonspecific ST abnormality.  Compared to previous EKG from March 2018 there is no significant difference.

## 2022-11-28 NOTE — Assessment & Plan Note (Addendum)
Has been having difficulty recalling names Not sure if this is normal for age related or related to covid. Check tsh, B12 Advised regular exercise, good sleep, healthy diet Discussed possible referral to neuropsych

## 2022-12-07 ENCOUNTER — Other Ambulatory Visit: Payer: Self-pay | Admitting: Internal Medicine

## 2023-01-04 ENCOUNTER — Other Ambulatory Visit: Payer: Self-pay | Admitting: Internal Medicine

## 2023-02-05 ENCOUNTER — Other Ambulatory Visit: Payer: Self-pay | Admitting: Internal Medicine

## 2023-03-06 ENCOUNTER — Other Ambulatory Visit: Payer: Self-pay | Admitting: Internal Medicine

## 2023-03-07 ENCOUNTER — Other Ambulatory Visit: Payer: Self-pay | Admitting: Medical Genetics

## 2023-03-09 ENCOUNTER — Encounter: Payer: Self-pay | Admitting: Internal Medicine

## 2023-03-12 NOTE — Progress Notes (Unsigned)
    Subjective:    Patient ID: Whitney Blake, female    DOB: 1960/10/21, 63 y.o.   MRN: [id number]      HPI Nikkia is here for No chief complaint on file.    Elevated BP-last week her blood pressure was elevated.  1 night it was 171/76.  She took an additional telmisartan    Medications and allergies reviewed with patient and updated if appropriate.  Current Outpatient Medications on File Prior to Visit  Medication Sig Dispense Refill   cholecalciferol (VITAMIN D) 1000 units tablet Take 2,000 Units by mouth daily.     clobetasol ointment (TEMOVATE) 0.05 % SMARTSIG:Sparingly Topical Twice Daily PRN     Multiple Vitamin (MULTIVITAMIN WITH MINERALS) TABS tablet Take 1 tablet by mouth daily.     telmisartan-hydrochlorothiazide (MICARDIS HCT) 40-12.5 MG tablet TAKE 1 TABLET BY MOUTH DAILY. 30 tablet 0   No current facility-administered medications on file prior to visit.    Review of Systems     Objective:  There were no vitals filed for this visit. BP Readings from Last 3 Encounters:  11/28/22 132/78  06/12/22 132/76  01/16/22 132/80   Wt Readings from Last 3 Encounters:  11/28/22 190 lb (86.2 kg)  01/16/22 183 lb 3.2 oz (83.1 kg)  01/10/22 184 lb (83.5 kg)   There is no height or weight on file to calculate BMI.    Physical Exam         Assessment & Plan:    See Problem List for Assessment and Plan of chronic medical problems.

## 2023-03-13 ENCOUNTER — Ambulatory Visit: Payer: 59 | Admitting: Internal Medicine

## 2023-03-13 VITALS — BP 142/78 | HR 65 | Temp 98.5°F | Ht 64.5 in | Wt 190.0 lb

## 2023-03-13 DIAGNOSIS — I10 Essential (primary) hypertension: Secondary | ICD-10-CM | POA: Diagnosis not present

## 2023-03-13 MED ORDER — TELMISARTAN-HCTZ 80-25 MG PO TABS: 1.0000 | ORAL_TABLET | Freq: Every day | ORAL | 1 refills | Status: DC

## 2023-03-13 NOTE — Patient Instructions (Addendum)
      Blood work was ordered.   Have this done in about two weeks    Medications changes include :   increase your medication dose to 80 - 25 mg daily     Return for follow up as scheduled.

## 2023-03-13 NOTE — Assessment & Plan Note (Signed)
Chronic Blood pressure is not controlled CBC, CMP, lipid panel Increase telmisartan-hydrochlorothiazide to 80-25 mg daily Bmp in 2 weeks

## 2023-03-27 ENCOUNTER — Other Ambulatory Visit (HOSPITAL_COMMUNITY)
Admission: RE | Admit: 2023-03-27 | Discharge: 2023-03-27 | Disposition: A | Discharge status: Home / Self Care | Payer: Self-pay | Source: Ambulatory Visit | Attending: Medical Genetics | Admitting: Medical Genetics

## 2023-04-03 ENCOUNTER — Other Ambulatory Visit (INDEPENDENT_AMBULATORY_CARE_PROVIDER_SITE_OTHER): Payer: 59

## 2023-04-03 DIAGNOSIS — I10 Essential (primary) hypertension: Secondary | ICD-10-CM | POA: Diagnosis not present

## 2023-04-03 LAB — BASIC METABOLIC PANEL
BUN: 12 mg/dL (ref 6–23)
CO2: 31 meq/L (ref 19–32)
Calcium: 9.8 mg/dL (ref 8.4–10.5)
Chloride: 98 meq/L (ref 96–112)
Creatinine, Ser: 0.97 mg/dL (ref 0.40–1.20)
GFR: 62.1 mL/min (ref >60.00)
Glucose, Bld: 96 mg/dL (ref 70–99)
Potassium: 3.8 meq/L (ref 3.5–5.1)
Sodium: 137 meq/L (ref 135–145)

## 2023-04-04 ENCOUNTER — Encounter: Payer: Self-pay | Admitting: Internal Medicine

## 2023-04-11 LAB — GENECONNECT MOLECULAR SCREEN: Genetic Analysis Overall Interpretation: NEGATIVE

## 2023-05-29 ENCOUNTER — Ambulatory Visit: Payer: 59 | Admitting: Internal Medicine

## 2023-05-29 ENCOUNTER — Encounter: Payer: Self-pay | Admitting: Internal Medicine

## 2023-05-29 VITALS — BP 120/68 | HR 78 | Temp 97.8°F | Ht 64.5 in | Wt 189.0 lb

## 2023-05-29 DIAGNOSIS — Z8249 Family history of ischemic heart disease and other diseases of the circulatory system: Secondary | ICD-10-CM | POA: Insufficient documentation

## 2023-05-29 DIAGNOSIS — I10 Essential (primary) hypertension: Secondary | ICD-10-CM | POA: Diagnosis not present

## 2023-05-29 DIAGNOSIS — E66811 Obesity, class 1: Secondary | ICD-10-CM

## 2023-05-29 DIAGNOSIS — R7303 Prediabetes: Secondary | ICD-10-CM | POA: Diagnosis not present

## 2023-05-29 DIAGNOSIS — E6609 Other obesity due to excess calories: Secondary | ICD-10-CM

## 2023-05-29 DIAGNOSIS — Z6832 Body mass index (BMI) 32.0-32.9, adult: Secondary | ICD-10-CM

## 2023-05-29 LAB — BASIC METABOLIC PANEL WITH GFR
BUN: 14 mg/dL (ref 6–23)
CO2: 29 meq/L (ref 19–32)
Calcium: 9.9 mg/dL (ref 8.4–10.5)
Chloride: 100 meq/L (ref 96–112)
Creatinine, Ser: 1 mg/dL (ref 0.40–1.20)
GFR: 59.81 mL/min — ABNORMAL LOW (ref >60.00)
Glucose, Bld: 124 mg/dL — ABNORMAL HIGH (ref 70–99)
Potassium: 3.6 meq/L (ref 3.5–5.1)
Sodium: 137 meq/L (ref 135–145)

## 2023-05-29 LAB — HEMOGLOBIN A1C: Hgb A1c MFr Bld: 5.9 % (ref 4.6–6.5)

## 2023-05-29 NOTE — Patient Instructions (Addendum)
      Blood work was ordered.       Medications changes include :   None    A ultrasound of your aorta was ordered and someone will call you to schedule an appointment.     Return in about 6 months (around 11/28/2023) for Physical Exam.

## 2023-05-29 NOTE — Assessment & Plan Note (Signed)
 Chronic BMI today 31.94 She has cut out some things from her diet which has helped her lose some weight She is exercising regularly and will try to increase her exercise which will help Discussed decreased portions, diet high in protein, vegetables-watch the calorie intake Not interested in medication-she knows doing what she is doing is the 1 thing that we will work long-term

## 2023-05-29 NOTE — Assessment & Plan Note (Signed)
 Fam hx of aortic aneurysm Also with increasing redness in lateral abdomen - both sides - per derm likely vascular in nature Will get US  of aorta Consider vascular referral

## 2023-05-29 NOTE — Progress Notes (Signed)
 Subjective:    Patient ID: Whitney Blake, female    DOB: [birth year], 63 y.o.   MRN: [id number]     HPI Whitney Blake is here for follow up of her chronic medical problems.  Difficulty losing weight - has cut out vitamin water and has decreased sweet tea.  She does 10 min of elliptical 4 days week and walks during lunch.  She knows she can try to exercise more.  She    Medications and allergies reviewed with patient and updated if appropriate.  Current Outpatient Medications on File Prior to Visit  Medication Sig Dispense Refill   cholecalciferol (VITAMIN D) 1000 units tablet Take 2,000 Units by mouth daily.     clobetasol ointment (TEMOVATE) 0.05 % SMARTSIG:Sparingly Topical Twice Daily PRN     Multiple Vitamin (MULTIVITAMIN WITH MINERALS) TABS tablet Take 1 tablet by mouth daily.     telmisartan-hydrochlorothiazide (MICARDIS HCT) 80-25 MG tablet Take 1 tablet by mouth daily. 90 tablet 1   No current facility-administered medications on file prior to visit.     Review of Systems  Constitutional: Negative.  Negative for fever.  Respiratory:  Negative for cough, shortness of breath and wheezing.   Cardiovascular:  Negative for chest pain, palpitations and leg swelling.  Gastrointestinal:  Positive for diarrhea (chronic). Negative for abdominal pain and blood in stool.  Neurological:  Negative for light-headedness and headaches.       Objective:   Vitals:   05/29/23 1315  BP: 120/68  Pulse: 78  Temp: 97.8 F (36.6 C)  SpO2: 97%   BP Readings from Last 3 Encounters:  05/29/23 120/68  03/13/23 (!) 142/78  11/28/22 132/78   Wt Readings from Last 3 Encounters:  05/29/23 189 lb (85.7 kg)  03/13/23 190 lb (86.2 kg)  11/28/22 190 lb (86.2 kg)   Body mass index is 31.94 kg/m.    Physical Exam Constitutional:      General: She is not in acute distress.    Appearance: Normal appearance.  HENT:     Head: Normocephalic and atraumatic.  Eyes:     Conjunctiva/sclera:  Conjunctivae normal.  Cardiovascular:     Rate and Rhythm: Normal rate and regular rhythm.     Heart sounds: Normal heart sounds.  Pulmonary:     Effort: Pulmonary effort is normal. No respiratory distress.     Breath sounds: Normal breath sounds. No wheezing.  Musculoskeletal:     Cervical back: Neck supple.     Right lower leg: No edema.     Left lower leg: No edema.  Lymphadenopathy:     Cervical: No cervical adenopathy.  Skin:    General: Skin is warm and dry.     Findings: No rash.  Neurological:     Mental Status: She is alert. Mental status is at baseline.  Psychiatric:        Mood and Affect: Mood normal.        Behavior: Behavior normal.        Lab Results  Component Value Date   WBC 7.7 11/28/2022   HGB 14.5 11/28/2022   HCT 44.3 11/28/2022   PLT 277.0 11/28/2022   GLUCOSE 96 04/03/2023   CHOL 183 11/28/2022   TRIG 144.0 11/28/2022   HDL 54.60 11/28/2022   LDLCALC 100 (H) 11/28/2022   ALT 24 11/28/2022   AST 18 11/28/2022   NA 137 04/03/2023   K 3.8 04/03/2023   CL 98 04/03/2023   CREATININE 0.97  04/03/2023   BUN 12 04/03/2023   CO2 31 04/03/2023   TSH 2.84 11/28/2022   HGBA1C 5.9 11/28/2022     Assessment & Plan:    See Problem List for Assessment and Plan of chronic medical problems.

## 2023-05-29 NOTE — Assessment & Plan Note (Signed)
 Chronic Lab Results  Component Value Date   HGBA1C 5.9 11/28/2022   Check a1c Low sugar / carb diet Stressed regular exercise

## 2023-05-29 NOTE — Assessment & Plan Note (Signed)
 Chronic Blood pressure is controlled BMP Continue telmisartan-hydrochlorothiazide to 80-25 mg daily

## 2023-05-30 ENCOUNTER — Encounter: Payer: Self-pay | Admitting: Internal Medicine

## 2023-06-05 ENCOUNTER — Telehealth: Payer: Self-pay

## 2023-06-05 NOTE — Telephone Encounter (Signed)
 LM for Alisha to call me back.  I am needing to know regarding test how or what should be ordered.  If she calls back please obtain info so her referral can be edited if needed.

## 2023-06-05 NOTE — Telephone Encounter (Signed)
 Copied from CRM [id number]. Topic: General - Other >> Jun 05, 2023  7:59 AM Kita Perish H wrote: Reason for CRM: Whitney Blake is calling regarding test patient has scheduled for tomorrow states patient might have to pay out of pocket due to the way the order is written, please reach out thanks.  [name] [phone]

## 2023-06-06 ENCOUNTER — Ambulatory Visit (HOSPITAL_COMMUNITY)
Admission: RE | Admit: 2023-06-06 | Discharge: 2023-06-06 | Disposition: A | Discharge status: Home / Self Care | Source: Ambulatory Visit | Attending: Cardiovascular Disease | Admitting: Cardiovascular Disease

## 2023-06-06 DIAGNOSIS — Z8249 Family history of ischemic heart disease and other diseases of the circulatory system: Secondary | ICD-10-CM

## 2023-06-07 ENCOUNTER — Encounter: Payer: Self-pay | Admitting: Internal Medicine

## 2023-07-11 ENCOUNTER — Encounter (HOSPITAL_COMMUNITY): Payer: Self-pay | Admitting: Cardiology

## 2023-09-11 ENCOUNTER — Other Ambulatory Visit: Payer: Self-pay | Admitting: Internal Medicine

## 2023-10-12 ENCOUNTER — Telehealth: Admitting: Physician Assistant

## 2023-10-12 DIAGNOSIS — U071 COVID-19: Secondary | ICD-10-CM | POA: Diagnosis not present

## 2023-10-12 MED ORDER — NIRMATRELVIR/RITONAVIR (PAXLOVID) TABLET (RENAL DOSING): 2.0000 | ORAL_TABLET | Freq: Two times a day (BID) | ORAL | 0 refills | Status: AC

## 2023-10-12 NOTE — Progress Notes (Signed)
 Virtual Visit Consent   Whitney Blake, you are scheduled for a virtual visit with a [facility] provider today. Just as with appointments in the office, your consent must be obtained to participate. Your consent will be active for this visit and any virtual visit you may have with one of our providers in the next 365 days. If you have a MyChart account, a copy of this consent can be sent to you electronically.  As this is a virtual visit, video technology does not allow for your provider to perform a traditional examination. This may limit your provider's ability to fully assess your condition. If your provider identifies any concerns that need to be evaluated in person or the need to arrange testing (such as labs, EKG, etc.), we will make arrangements to do so. Although advances in technology are sophisticated, we cannot ensure that it will always work on either your end or our end. If the connection with a video visit is poor, the visit may have to be switched to a telephone visit. With either a video or telephone visit, we are not always able to ensure that we have a secure connection.  By engaging in this virtual visit, you consent to the provision of healthcare and authorize for your insurance to be billed (if applicable) for the services provided during this visit. Depending on your insurance coverage, you may receive a charge related to this service.  I need to obtain your verbal consent now. Are you willing to proceed with your visit today? Whitney Blake has provided verbal consent on 10/12/2023 for a virtual visit (video or telephone). [name], PA-C  Date: 10/12/2023 8:13 AM   Virtual Visit via Video Note   I, [name], connected with  Whitney Blake  ([id number], May 03, 1960) on 10/12/23 at  8:00 AM EDT by a video-enabled telemedicine application and verified that I am speaking with the correct person using two identifiers.  Location: Patient: Virtual Visit Location  Patient: Home Provider: Virtual Visit Location Provider: Home Office   I discussed the limitations of evaluation and management by telemedicine and the availability of in person appointments. The patient expressed understanding and agreed to proceed.    History of Present Illness: Whitney Blake is a 63 y.o. who identifies as a female who was assigned female at birth, and is being seen today for Covid 48.  HPI: URI  This is a new problem. Episode onset: Tested positive for Covid on at home test; Symptoms started 2-3 days ago. The problem has been gradually worsening. Associated symptoms include congestion, coughing, headaches, a plugged ear sensation, rhinorrhea, sinus pain, a sore throat and vomiting. Pertinent negatives include no ear pain. She has tried acetaminophen  (Mucinex ) for the symptoms. The treatment provided no relief.     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   History of COVID-19 11/05/2019   Memory difficulties 11/05/2019   Smell or taste sensation disturbance 07/03/2019   Bursitis of right foot 05/01/2019   Leg cramping 07/02/2018   Unspecified  dyspareunia (CODE) 12/11/2017   Lichen sclerosus of female genitalia 05/04/2017   Vitamin D  deficiency 05/03/2017   Lumbar radiculopathy 05/15/2016   Prediabetes 04/29/2016   Nephrolithiasis 09/06/2012   Essential hypertension 12/07/2011   Migraine 12/21/2008   NONSPECIFIC ABNORMAL ELECTROCARDIOGRAM 09/03/2007   SPINAL STENOSIS 06/14/2006    Allergies:   Allergies  Allergen Reactions   Rofecoxib Swelling    Vioxx caused  swelling of hands& feet   Coreg  [Carvedilol ] Other (See Comments)    Caused hot flashes   Medications:  Current Outpatient Medications:    nirmatrelvir /ritonavir , renal dosing, (PAXLOVID ) 10 x 150 MG & 10 x 100MG  TABS, Take 2 tablets by mouth 2 (two) times daily for 5 days. (Take nirmatrelvir  150 mg one tablet twice daily for 5 days and ritonavir  100 mg one tablet twice daily for 5 days) Patient GFR is 59, Disp: 20 tablet, Rfl: 0   cholecalciferol (VITAMIN D ) 1000 units tablet, Take 2,000 Units by mouth daily., Disp: , Rfl:    clobetasol ointment (TEMOVATE) 0.05 %, SMARTSIG:Sparingly Topical Twice Daily PRN, Disp: , Rfl:    Multiple Vitamin (MULTIVITAMIN WITH MINERALS) TABS tablet, Take 1 tablet by mouth daily., Disp: , Rfl:    telmisartan -hydrochlorothiazide  (MICARDIS  HCT) 80-25 MG tablet, TAKE 1 TABLET BY MOUTH DAILY., Disp: 90 tablet, Rfl: 1  Observations/Objective: Patient is well-developed, well-nourished in no acute distress.  Resting comfortably at home.  Head is normocephalic, atraumatic.  No labored breathing.  Speech is clear and coherent with logical content.  Patient is alert and oriented at baseline.    Assessment and Plan: 1. COVID-19 (Primary) - nirmatrelvir /ritonavir , renal dosing, (PAXLOVID ) 10 x 150 MG & 10 x 100MG  TABS; Take 2 tablets by mouth 2 (two) times daily for 5 days. (Take nirmatrelvir  150 mg one tablet twice daily for 5 days and ritonavir  100 mg one tablet twice daily for 5 days) Patient GFR is 59  Dispense: 20 tablet; Refill: 0  - Continue OTC symptomatic management of choice - Will send OTC vitamins and supplement information through AVS - Paxlovid  (renal dose) prescribed - Push fluids - Rest as needed - Discussed return precautions and when to seek in-person evaluation, sent via AVS as well   Follow Up Instructions: I discussed the assessment and treatment plan with the patient.  The patient was provided an opportunity to ask questions and all were answered. The patient agreed with the plan and demonstrated an understanding of the instructions.  A copy of instructions were sent to the patient via MyChart unless otherwise noted below.    The patient was advised to call back or seek an in-person evaluation if the symptoms worsen or if the condition fails to improve as anticipated.    [name], PA-C

## 2023-10-12 NOTE — Patient Instructions (Signed)
 Whitney Blake, thank you for joining Whitney CHRISTELLA Dickinson, PA-C for today's virtual visit.  While this provider is not your primary care provider (PCP), if your PCP is located in our provider database this encounter information will be shared with them immediately following your visit.   A [facility] MyChart account gives you access to today's visit and all your visits, tests, and labs performed at [facility]  click here if you don't have a [facility] MyChart account or go to [email or web address]  Consent: (Patient) Whitney Blake provided verbal consent for this virtual visit at the beginning of the encounter.  Current Medications:  Current Outpatient Medications:    nirmatrelvir /ritonavir , renal dosing, (PAXLOVID ) 10 x 150 MG & 10 x 100MG  TABS, Take 2 tablets by mouth 2 (two) times daily for 5 days. (Take nirmatrelvir  150 mg one tablet twice daily for 5 days and ritonavir  100 mg one tablet twice daily for 5 days) Patient GFR is 59, Disp: 20 tablet, Rfl: 0   cholecalciferol (VITAMIN D ) 1000 units tablet, Take 2,000 Units by mouth daily., Disp: , Rfl:    clobetasol ointment (TEMOVATE) 0.05 %, SMARTSIG:Sparingly Topical Twice Daily PRN, Disp: , Rfl:    Multiple Vitamin (MULTIVITAMIN WITH MINERALS) TABS tablet, Take 1 tablet by mouth daily., Disp: , Rfl:    telmisartan -hydrochlorothiazide  (MICARDIS  HCT) 80-25 MG tablet, TAKE 1 TABLET BY MOUTH DAILY., Disp: 90 tablet, Rfl: 1   Medications ordered in this encounter:  Meds ordered this encounter  Medications   nirmatrelvir /ritonavir , renal dosing, (PAXLOVID ) 10 x 150 MG & 10 x 100MG  TABS    Sig: Take 2 tablets by mouth 2 (two) times daily for 5 days. (Take nirmatrelvir  150 mg one tablet twice daily for 5 days and ritonavir  100 mg one tablet twice daily for 5 days) Patient GFR is 59    Dispense:  20 tablet    Refill:  0    Supervising Provider:   BLAISE ALEENE KIDD [phone]     *If you need refills on other medications prior  to your next appointment, please contact your pharmacy*  Follow-Up: Call back or seek an in-person evaluation if the symptoms worsen or if the condition fails to improve as anticipated.  [facility] [phone]  Care Instructions: Can take to lessen severity (if able): Vit C 500mg  twice daily Quercertin 250-500mg  twice daily Zinc 75-100mg  daily Melatonin 3-6 mg at bedtime Vit D3 1000-2000 IU daily Aspirin 81 mg daily with food Optional: Famotidine 20mg  daily Also can add tylenol /ibuprofen as needed for fevers and body aches May add Mucinex  or Mucinex  DM as needed for cough/congestion    Isolation Instructions: You are to isolate at home until you have been fever free for at least 24 hours without a fever-reducing medication, and symptoms have been steadily improving for 24 hours. At that time,  you can end isolation but need to mask for an additional 5 days.   If you must be around other household members who do not have symptoms, you need to make sure that both you and the family members are masking consistently with a high-quality mask.  If you note any worsening of symptoms despite treatment, please seek an in-person evaluation ASAP. If you note any significant shortness of breath or any chest pain, please seek ER evaluation. Please do not delay care!   COVID-19: What to Do if You Are Sick If you test positive and are an older adult or someone who is at high  risk of getting very sick from COVID-19, treatment may be available. Contact a healthcare provider right away after a positive test to determine if you are eligible, even if your symptoms are mild right now. You can also visit a Test to Treat location and, if eligible, receive a prescription from a provider. Don't delay: Treatment must be started within the first few days to be effective. If you have a fever, cough, or other symptoms, you might have COVID-19. Most people have mild illness and are able to recover  at home. If you are sick: Keep track of your symptoms. If you have an emergency warning sign (including trouble breathing), call 911. Steps to help prevent the spread of COVID-19 if you are sick If you are sick with COVID-19 or think you might have COVID-19, follow the steps below to care for yourself and to help protect other people in your home and community. Stay home except to get medical care Stay home. Most people with COVID-19 have mild illness and can recover at home without medical care. Do not leave your home, except to get medical care. Do not visit public areas and do not go to places where you are unable to wear a mask. Take care of yourself. Get rest and stay hydrated. Take over-the-counter medicines, such as acetaminophen , to help you feel better. Stay in touch with your doctor. Call before you get medical care. Be sure to get care if you have trouble breathing, or have any other emergency warning signs, or if you think it is an emergency. Avoid public transportation, ride-sharing, or taxis if possible. Get tested If you have symptoms of COVID-19, get tested. While waiting for test results, stay away from others, including staying apart from those living in your household. Get tested as soon as possible after your symptoms start. Treatments may be available for people with COVID-19 who are at risk for becoming very sick. Don't delay: Treatment must be started early to be effective--some treatments must begin within 5 days of your first symptoms. Contact your healthcare provider right away if your test result is positive to determine if you are eligible. Self-tests are one of several options for testing for the virus that causes COVID-19 and may be more convenient than laboratory-based tests and point-of-care tests. Ask your healthcare provider or your local health department if you need help interpreting your test results. You can visit your state, tribal, local, and territorial health  department's website to look for the latest local information on testing sites. Separate yourself from other people As much as possible, stay in a specific room and away from other people and pets in your home. If possible, you should use a separate bathroom. If you need to be around other people or animals in or outside of the home, wear a well-fitting mask. Tell your close contacts that they may have been exposed to COVID-19. An infected person can spread COVID-19 starting 48 hours (or 2 days) before the person has any symptoms or tests positive. By letting your close contacts know they may have been exposed to COVID-19, you are helping to protect everyone. See COVID-19 and Animals if you have questions about pets. If you are diagnosed with COVID-19, someone from the health department may call you. Answer the call to slow the spread. Monitor your symptoms Symptoms of COVID-19 include fever, cough, or other symptoms. Follow care instructions from your healthcare provider and local health department. Your local health authorities may give instructions on checking your  symptoms and reporting information. When to seek emergency medical attention Look for emergency warning signs* for COVID-19. If someone is showing any of these signs, seek emergency medical care immediately: Trouble breathing Persistent pain or pressure in the chest New confusion Inability to wake or stay awake Pale, gray, or blue-colored skin, lips, or nail beds, depending on skin tone *This list is not all possible symptoms. Please call your medical provider for any other symptoms that are severe or concerning to you. Call 911 or call ahead to your local emergency facility: Notify the operator that you are seeking care for someone who has or may have COVID-19. Call ahead before visiting your doctor Call ahead. Many medical visits for routine care are being postponed or done by phone or telemedicine. If you have a medical  appointment that cannot be postponed, call your doctor's office, and tell them you have or may have COVID-19. This will help the office protect themselves and other patients. If you are sick, wear a well-fitting mask You should wear a mask if you must be around other people or animals, including pets (even at home). Wear a mask with the best fit, protection, and comfort for you. You don't need to wear the mask if you are alone. If you can't put on a mask (because of trouble breathing, for example), cover your coughs and sneezes in some other way. Try to stay at least 6 feet away from other people. This will help protect the people around you. Masks should not be placed on young children under age 36 years, anyone who has trouble breathing, or anyone who is not able to remove the mask without help. Cover your coughs and sneezes Cover your mouth and nose with a tissue when you cough or sneeze. Throw away used tissues in a lined trash can. Immediately wash your hands with soap and water for at least 20 seconds. If soap and water are not available, clean your hands with an alcohol-based hand sanitizer that contains at least 60% alcohol. Clean your hands often Wash your hands often with soap and water for at least 20 seconds. This is especially important after blowing your nose, coughing, or sneezing; going to the bathroom; and before eating or preparing food. Use hand sanitizer if soap and water are not available. Use an alcohol-based hand sanitizer with at least 60% alcohol, covering all surfaces of your hands and rubbing them together until they feel dry. Soap and water are the best option, especially if hands are visibly dirty. Avoid touching your eyes, nose, and mouth with unwashed hands. Handwashing Tips Avoid sharing personal household items Do not share dishes, drinking glasses, cups, eating utensils, towels, or bedding with other people in your home. Wash these items thoroughly after using them  with soap and water or put in the dishwasher. Clean surfaces in your home regularly Clean and disinfect high-touch surfaces (for example, doorknobs, tables, handles, light switches, and countertops) in your sick room and bathroom. In shared spaces, you should clean and disinfect surfaces and items after each use by the person who is ill. If you are sick and cannot clean, a caregiver or other person should only clean and disinfect the area around you (such as your bedroom and bathroom) on an as needed basis. Your caregiver/other person should wait as long as possible (at least several hours) and wear a mask before entering, cleaning, and disinfecting shared spaces that you use. Clean and disinfect areas that may have blood, stool, or body  fluids on them. Use household cleaners and disinfectants. Clean visible dirty surfaces with household cleaners containing soap or detergent. Then, use a household disinfectant. Use a product from [employer] List N: Disinfectants for Coronavirus (COVID-19). Be sure to follow the instructions on the label to ensure safe and effective use of the product. Many products recommend keeping the surface wet with a disinfectant for a certain period of time (look at contact time on the product label). You may also need to wear personal protective equipment, such as gloves, depending on the directions on the product label. Immediately after disinfecting, wash your hands with soap and water for 20 seconds. For completed guidance on cleaning and disinfecting your home, visit Complete Disinfection Guidance. Take steps to improve ventilation at home Improve ventilation (air flow) at home to help prevent from spreading COVID-19 to other people in your household. Clear out COVID-19 virus particles in the air by opening windows, using air filters, and turning on fans in your home. Use this interactive tool to learn how to improve air flow in your home. When you can be around others after  being sick with COVID-19 Deciding when you can be around others is different for different situations. Find out when you can safely end home isolation. For any additional questions about your care, contact your healthcare provider or state or local health department. 05/04/2020 Content source: [facility] ([facility]), Division of Viral Diseases This information is not intended to replace advice given to you by your health care provider. Make sure you discuss any questions you have with your health care provider. Document Revised: 06/17/2020 Document Reviewed: 06/17/2020 Elsevier Patient Education  2022 [employer].     If you have been instructed to have an in-person evaluation today at a local Urgent Care facility, please use the link below. It will take you to a list of all of our available [facility], including address, phone number and hours of operation. Please do not delay care.  [facility]  If you or a family member do not have a primary care provider, use the link below to schedule a visit and establish care. When you choose a [facility] primary care physician or advanced practice provider, you gain a long-term partner in health. Find a Primary Care Provider  Learn more about [facility] in-office and virtual care options: [facility] Get Care Now

## 2023-11-28 ENCOUNTER — Other Ambulatory Visit: Payer: Self-pay | Admitting: Obstetrics and Gynecology

## 2023-11-28 DIAGNOSIS — Z1231 Encounter for screening mammogram for malignant neoplasm of breast: Secondary | ICD-10-CM

## 2023-11-30 ENCOUNTER — Encounter: Admitting: Internal Medicine

## 2023-12-03 NOTE — Progress Notes (Unsigned)
 Subjective:    Patient ID: Whitney Blake, female    DOB: [birth year], 63 y.o.   MRN: [id number]     HPI Cornelious is here for follow up of her chronic medical problems.  Weight keeps going up.   Had covid the end of August - that was the third time.    Exercises 3-4 times a week.  Doing some arm weights  Having chronic hoarseness-does not seem to be going away.  Medications and allergies reviewed with patient and updated if appropriate.  Current Outpatient Medications on File Prior to Visit  Medication Sig Dispense Refill   APPLE CIDER VINEGAR PO Take by mouth.     cholecalciferol (VITAMIN D ) 1000 units tablet Take 2,000 Units by mouth daily.     clobetasol ointment (TEMOVATE) 0.05 % SMARTSIG:Sparingly Topical Twice Daily PRN     Multiple Vitamin (MULTIVITAMIN WITH MINERALS) TABS tablet Take 1 tablet by mouth daily.     telmisartan -hydrochlorothiazide  (MICARDIS  HCT) 80-25 MG tablet TAKE 1 TABLET BY MOUTH DAILY. 90 tablet 1   No current facility-administered medications on file prior to visit.     Review of Systems  Constitutional:  Negative for fever.  HENT:  Positive for voice change (chronic). Negative for postnasal drip.   Respiratory:  Negative for cough, shortness of breath and wheezing.   Cardiovascular:  Negative for chest pain, palpitations and leg swelling.  Gastrointestinal:        GERD at night sometimes  Musculoskeletal:        Cramping in right leg - from back  Neurological:  Negative for light-headedness and headaches.       Objective:   Vitals:   12/04/23 1412  BP: 132/70  Pulse: 64  Temp: 98.4 F (36.9 C)  SpO2: 97%   BP Readings from Last 3 Encounters:  12/04/23 132/70  05/29/23 120/68  03/13/23 (!) 142/78   Wt Readings from Last 3 Encounters:  12/04/23 193 lb (87.5 kg)  05/29/23 189 lb (85.7 kg)  03/13/23 190 lb (86.2 kg)   Body mass index is 32.62 kg/m.    Physical Exam Constitutional:      General: She is not in acute  distress.    Appearance: Normal appearance.  HENT:     Head: Normocephalic and atraumatic.  Eyes:     Conjunctiva/sclera: Conjunctivae normal.  Cardiovascular:     Rate and Rhythm: Normal rate and regular rhythm.     Heart sounds: Normal heart sounds.  Pulmonary:     Effort: Pulmonary effort is normal. No respiratory distress.     Breath sounds: Normal breath sounds. No wheezing.  Musculoskeletal:     Cervical back: Neck supple.     Right lower leg: No edema.     Left lower leg: No edema.  Lymphadenopathy:     Cervical: No cervical adenopathy.  Skin:    General: Skin is warm and dry.     Findings: No rash.  Neurological:     Mental Status: She is alert. Mental status is at baseline.  Psychiatric:        Mood and Affect: Mood normal.        Behavior: Behavior normal.        Lab Results  Component Value Date   WBC 7.7 11/28/2022   HGB 14.5 11/28/2022   HCT 44.3 11/28/2022   PLT 277.0 11/28/2022   GLUCOSE 124 (H) 05/29/2023   CHOL 183 11/28/2022   TRIG 144.0 11/28/2022  HDL 54.60 11/28/2022   LDLCALC 100 (H) 11/28/2022   ALT 24 11/28/2022   AST 18 11/28/2022   NA 137 05/29/2023   K 3.6 05/29/2023   CL 100 05/29/2023   CREATININE 1.00 05/29/2023   BUN 14 05/29/2023   CO2 29 05/29/2023   TSH 2.84 11/28/2022   HGBA1C 5.9 05/29/2023     Assessment & Plan:    See Problem List for Assessment and Plan of chronic medical problems.

## 2023-12-04 ENCOUNTER — Encounter: Payer: Self-pay | Admitting: Internal Medicine

## 2023-12-04 ENCOUNTER — Ambulatory Visit: Admitting: Internal Medicine

## 2023-12-04 VITALS — BP 132/70 | HR 64 | Temp 98.4°F | Ht 64.5 in | Wt 193.0 lb

## 2023-12-04 DIAGNOSIS — Z23 Encounter for immunization: Secondary | ICD-10-CM | POA: Diagnosis not present

## 2023-12-04 DIAGNOSIS — I10 Essential (primary) hypertension: Secondary | ICD-10-CM

## 2023-12-04 DIAGNOSIS — R7303 Prediabetes: Secondary | ICD-10-CM

## 2023-12-04 DIAGNOSIS — E7800 Pure hypercholesterolemia, unspecified: Secondary | ICD-10-CM | POA: Diagnosis not present

## 2023-12-04 DIAGNOSIS — R490 Dysphonia: Secondary | ICD-10-CM | POA: Diagnosis not present

## 2023-12-04 LAB — LIPID PANEL
Cholesterol: 185 mg/dL (ref 0–200)
HDL: 49.3 mg/dL (ref >39.00)
LDL Cholesterol: 75 mg/dL (ref 0–99)
NonHDL: 135.36
Total CHOL/HDL Ratio: 4
Triglycerides: 301 mg/dL — ABNORMAL HIGH (ref 0.0–149.0)
VLDL: 60.2 mg/dL — ABNORMAL HIGH (ref 0.0–40.0)

## 2023-12-04 LAB — COMPREHENSIVE METABOLIC PANEL WITH GFR
ALT: 29 U/L (ref 0–35)
AST: 21 U/L (ref 0–37)
Albumin: 4.2 g/dL (ref 3.5–5.2)
Alkaline Phosphatase: 55 U/L (ref 39–117)
BUN: 15 mg/dL (ref 6–23)
CO2: 32 meq/L (ref 19–32)
Calcium: 9.4 mg/dL (ref 8.4–10.5)
Chloride: 99 meq/L (ref 96–112)
Creatinine, Ser: 0.92 mg/dL (ref 0.40–1.20)
GFR: 65.86 mL/min (ref >60.00)
Glucose, Bld: 111 mg/dL — ABNORMAL HIGH (ref 70–99)
Potassium: 3.5 meq/L (ref 3.5–5.1)
Sodium: 138 meq/L (ref 135–145)
Total Bilirubin: 0.4 mg/dL (ref 0.2–1.2)
Total Protein: 6.9 g/dL (ref 6.0–8.3)

## 2023-12-04 LAB — HEMOGLOBIN A1C: Hgb A1c MFr Bld: 5.9 % (ref 4.6–6.5)

## 2023-12-04 NOTE — Assessment & Plan Note (Addendum)
 Chronic Has frequent GERD at night, denies PND Likely related to GERD Deferred trial of PPI Advised not eating too late or laying down w/in 3 hrs of eating Ref ENT

## 2023-12-04 NOTE — Assessment & Plan Note (Signed)
 Chronic Lab Results  Component Value Date   HGBA1C 5.9 12/04/2023   Check a1c Low sugar / carb diet Stressed regular exercise

## 2023-12-04 NOTE — Assessment & Plan Note (Signed)
Chronic Regular exercise and healthy diet encouraged Check lipid panel, CMP Continue lifestyle control

## 2023-12-04 NOTE — Assessment & Plan Note (Signed)
 Chronic Blood pressure is controlled Cmp Continue telmisartan -hydrochlorothiazide  to 80-25 mg daily

## 2023-12-04 NOTE — Patient Instructions (Addendum)
    Pneumonia vaccine given today.      Blood work was ordered.       Medications changes include :   None    A referral was ordered [facility] and someone will call you to schedule an appointment.     Return in about 6 months (around 06/03/2024) for Physical Exam.

## 2023-12-06 ENCOUNTER — Ambulatory Visit: Payer: Self-pay | Admitting: Internal Medicine

## 2023-12-11 ENCOUNTER — Ambulatory Visit: Admission: RE | Admit: 2023-12-11 | Discharge: 2023-12-11 | Disposition: A | Source: Ambulatory Visit

## 2023-12-11 DIAGNOSIS — Z1231 Encounter for screening mammogram for malignant neoplasm of breast: Secondary | ICD-10-CM

## 2023-12-13 ENCOUNTER — Ambulatory Visit (INDEPENDENT_AMBULATORY_CARE_PROVIDER_SITE_OTHER)

## 2023-12-13 VITALS — BP 145/87 | HR 59 | Temp 98.0°F | Ht 64.0 in | Wt 183.0 lb

## 2023-12-13 DIAGNOSIS — R490 Dysphonia: Secondary | ICD-10-CM | POA: Diagnosis not present

## 2023-12-13 DIAGNOSIS — K219 Gastro-esophageal reflux disease without esophagitis: Secondary | ICD-10-CM | POA: Diagnosis not present

## 2023-12-13 DIAGNOSIS — J309 Allergic rhinitis, unspecified: Secondary | ICD-10-CM

## 2023-12-13 MED ORDER — OMEPRAZOLE 40 MG PO CPDR: 40.0000 mg | DELAYED_RELEASE_CAPSULE | Freq: Every day | ORAL | 2 refills | Status: AC

## 2023-12-13 NOTE — Progress Notes (Signed)
 HPI:   Discussed the use of AI scribe software for clinical note transcription with the patient, who gave verbal consent to proceed.  History of Present Illness Whitney Blake is a 63 year old female who presents with hoarseness and suspected acid reflux.  She has experienced hoarseness for approximately six months, which becomes noticeable after speaking for about five minutes, especially during work meetings. The hoarseness worsens with continued speaking, but there is no associated sore throat or throat pain. Occasionally, she feels a sensation of a lump in her throat, which is transient and resolves without intervention.  She has a history of acid reflux, experiencing symptoms two to three times a month, sometimes waking her at night. During these episodes, she feels as though she is 'throwing up in her mouth,' with a burning sensation until she drinks water. No heartburn in the chest, sour taste, or metallic taste in the throat. She uses Tums occasionally for relief but has not taken any other medications for reflux.  She denies any recent surgeries on her vocal cords or neck and reports no issues with speech clarity. She has experienced complete voice loss twice in her lifetime, but not recently. She has had COVID-19 three times, with the first instance in 2020 resulting in a loss of smell and taste, which returned distorted. Her taste and smell are gradually returning to normal, although she still finds certain foods unpalatable.  She denies postnasal drainage, but did report a sinus headache this morning, which is not typical for her. She reports no known allergies and does not take any allergy medications.    PMH/Meds/All/SocHx/FamHx/ROS: Past Medical History:  Diagnosis Date   Anemia    PMH of   Cervical spinal stenosis    Dr Mavis, NS   Hypertension    Internal hemorrhoids    Migraines    menstrual   Nonspecific ST-T changes 02/13/2005   Normal nuclear stress test 02/13/2005    Past Surgical History:  Procedure Laterality Date   cesearean   1989   COLONOSCOPY  02/13/2010   neg; [facility]   CONCUSSION     DUE TO FALL AT AGE 24   FOOT SURGERY     Right    LUMBAR LAMINECTOMY  02/13/2001   FOR RUPTURED DISC   ORIF CLAVICULAR FRACTURE     bilaterally @ age 1   TONSILLECTOMY AND ADENOIDECTOMY     No family history of bleeding disorders, wound healing problems or difficulty with anesthesia.  Social Connections: Moderately Integrated (03/13/2023)   Social Connection and Isolation Panel    Frequency of Communication with Friends and Family: More than three times a week    Frequency of Social Gatherings with Friends and Family: Once a week    Attends Religious Services: 1 to 4 times per year    Active Member of [employer] or Organizations: No    Attends Engineer, Structural: Not on file    Marital Status: Married    Current Outpatient Medications:    APPLE CIDER VINEGAR PO, Take by mouth., Disp: , Rfl:    cholecalciferol (VITAMIN D ) 1000 units tablet, Take 2,000 Units by mouth daily., Disp: , Rfl:    clobetasol ointment (TEMOVATE) 0.05 %, SMARTSIG:Sparingly Topical Twice Daily PRN, Disp: , Rfl:    mometasone  (ELOCON ) 0.1 % ointment, External, Disp: , Rfl:    Multiple Vitamin (MULTIVITAMIN WITH MINERALS) TABS tablet, Take 1 tablet by mouth daily., Disp: , Rfl:    telmisartan -hydrochlorothiazide  (MICARDIS  HCT) 80-25  MG tablet, TAKE 1 TABLET BY MOUTH DAILY., Disp: 90 tablet, Rfl: 1 A complete ROS was performed with pertinent positives/negatives noted in the HPI. The remainder of the ROS are negative.   Physical Exam:  BP (S) (!) 145/87 (BP Location: Left Arm, Patient Position: Sitting) Comment: pt working w/pcp on BP  Pulse (!) 59   Temp 98 F (36.7 C) (Oral)   Ht 5' 4 (1.626 m)   Wt 183 lb (83 kg)   LMP 02/14/2012   SpO2 98%   BMI 31.41 kg/m  General: Well developed, well nourished. No acute distress. Voice hoarse Head/Face: Normocephalic. No  sinus tenderness. Facial nerve intact and equal bilaterally. No facial lacerations. Eyes: PERRL, no scleral icterus or conjunctival hemorrhage. EOMI. Ears: No gross deformity. Normal external canal. Tympanic membrane in tact bilaterally Hearing: Normal speech reception.  Nose: No gross deformity or lesions. No purulent discharge. No turbinate hypertrophy. Mouth/Oropharynx: Lips without any lesions. Dentition good. No mucosal lesions within the oropharynx. No tonsillar enlargement, exudate, or lesions. Pharyngeal walls symmetrical. Uvula midline. Tongue midline without lesions. Larynx: See TFL if applicable Nasopharynx: See TFL if applicable Neck: Trachea midline. No masses. No thyromegaly or nodules palpated. No crepitus. Lymphatic: No lymphadenopathy in the neck. Respiratory: No stridor or distress. Room air. Cardiovascular: Regular rate and rhythm. Extremities: No edema or cyanosis. Warm and well-perfused. Skin: No scars or lesions on face or neck. Neurologic: CN II-XII grossly intact. Moving all extremities without gross abnormality. Other:  Independent Review of Additional Tests or Records: None Procedures:  Flexible Fiberoptic Laryngoscopy Procedure Note  Date of procedure 12/13/2023 Pre-Op Diagnosis: hoarseness    Post Op Diagnosis: same Procedure: Flexible Fiberoptic Laryngoscopy CPT 31575 - Mod 25 Surgeon: Adah Malkin, D.O. Anesthesia: 4% Lidocaine with Afrin Findings:  - interarytenoid pachydermia - post-cricoid edema - bilateral TVC motion Procedure Detail: After verbal consent was obtained from the patient, the patient was brought in an upright position, a fiberoptic nasal laryngoscope was then passed into the patient right nasal passage and left nasal passage, the left appeared to be more patent. It was passed along the floor of the nasal cavity to the nasopharynx. Torus tubarius was patent and the Fossa of Rosenmller was identified. The scope was then flexed caudally  and advanced slowly through the nasopharynx, passed through the oropharynx, and down into the hypopharynx. The patient's oro- and nasopharynx were unremarkable with no signs of any gross lesions, edema, masses, or bleeding.  The base of tongue was visualized and no mass, ulceration or lesion was appreciated. The epiglottis did not demonstrate any mass, ulceration or lesion. The vallecula was also assessed with no mass, ulceration or lesion. The patient had good glottic closure upon phonation and no signs of aspiration or pooling of secretions. The patient was asked to inspire and expire with the true vocal folds vibrating normally and without evidence of vocal fold dysfunction. The true and false vocal cords, AE folds, and arytenoids did not demonstrate any significant edema or erythema. Interarytenoid pachydermia and post-cricoid edema noted. The patient was then asked to valsalva, and the pyriform sinuses were assessed which were unremarkable. The airway was patent and there was no evidence of compromise. The scope was then slowly withdrawn from the patient. The patient tolerated the procedure well and there were no complications.  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 Laryngoscopy shows mild irritation consistent with acid reflux. - Prescribed omeprazole - Advised avoiding meals 2-3 hours before bedtime. - Recommended avoiding acid-promoting foods such as citrus, tomato-based, greasy, and spicy foods, especially in the evening.    Follow-up as needed.  Adah Malkin, DO [facility]

## 2024-03-01 ENCOUNTER — Encounter: Payer: Self-pay | Admitting: Internal Medicine

## 2024-03-07 ENCOUNTER — Other Ambulatory Visit: Payer: Self-pay | Admitting: Internal Medicine
